# Patient Record
Sex: Female | Born: 1935 | Race: White | Hispanic: No | Smoking: Never smoker
Health system: Southern US, Community
[De-identification: ages and names within clinical notes are randomized; demographics above are authoritative.]

## PROBLEM LIST (undated history)

## (undated) DIAGNOSIS — IMO0002 Reserved for concepts with insufficient information to code with codable children: Secondary | ICD-10-CM

## (undated) DIAGNOSIS — R32 Unspecified urinary incontinence: Secondary | ICD-10-CM

## (undated) DIAGNOSIS — M1990 Unspecified osteoarthritis, unspecified site: Secondary | ICD-10-CM

## (undated) DIAGNOSIS — B019 Varicella without complication: Secondary | ICD-10-CM

## (undated) DIAGNOSIS — M329 Systemic lupus erythematosus, unspecified: Secondary | ICD-10-CM

## (undated) DIAGNOSIS — G44009 Cluster headache syndrome, unspecified, not intractable: Secondary | ICD-10-CM

## (undated) DIAGNOSIS — K219 Gastro-esophageal reflux disease without esophagitis: Secondary | ICD-10-CM

## (undated) HISTORY — PX: FOOT SURGERY: SHX648

## (undated) HISTORY — DX: Varicella without complication: B01.9

## (undated) HISTORY — DX: Unspecified osteoarthritis, unspecified site: M19.90

## (undated) HISTORY — DX: Unspecified urinary incontinence: R32

## (undated) HISTORY — DX: Cluster headache syndrome, unspecified, not intractable: G44.009

## (undated) HISTORY — PX: CERVICAL SPINE SURGERY: SHX589

## (undated) HISTORY — DX: Reserved for concepts with insufficient information to code with codable children: IMO0002

## (undated) HISTORY — DX: Systemic lupus erythematosus, unspecified: M32.9

## (undated) HISTORY — DX: Gastro-esophageal reflux disease without esophagitis: K21.9

## (undated) HISTORY — PX: ABDOMINAL HYSTERECTOMY: SHX81

---
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
## 1968-07-18 HISTORY — PX: CHOLECYSTECTOMY: SHX55

## 1968-07-18 HISTORY — PX: APPENDECTOMY: SHX54

## 1997-08-21 ENCOUNTER — Encounter: Payer: Self-pay | Admitting: Internal Medicine

## 1998-02-24 ENCOUNTER — Other Ambulatory Visit: Admission: RE | Admit: 1998-02-24 | Discharge: 1998-02-24 | Payer: Self-pay | Admitting: Gynecology

## 1999-02-16 ENCOUNTER — Other Ambulatory Visit: Admission: RE | Admit: 1999-02-16 | Discharge: 1999-02-16 | Payer: Self-pay | Admitting: Gynecology

## 1999-10-01 ENCOUNTER — Encounter: Payer: Self-pay | Admitting: Gynecology

## 1999-10-06 ENCOUNTER — Inpatient Hospital Stay (HOSPITAL_COMMUNITY): Admission: RE | Admit: 1999-10-06 | Discharge: 1999-10-08 | Payer: Self-pay | Admitting: Gynecology

## 1999-10-21 ENCOUNTER — Encounter: Admission: RE | Admit: 1999-10-21 | Discharge: 1999-10-21 | Payer: Self-pay | Admitting: Internal Medicine

## 1999-10-21 ENCOUNTER — Encounter: Payer: Self-pay | Admitting: Internal Medicine

## 2000-04-17 ENCOUNTER — Encounter: Payer: Self-pay | Admitting: Internal Medicine

## 2001-01-13 ENCOUNTER — Encounter: Payer: Self-pay | Admitting: Rheumatology

## 2001-01-13 ENCOUNTER — Encounter: Admission: RE | Admit: 2001-01-13 | Discharge: 2001-01-13 | Payer: Self-pay | Admitting: Rheumatology

## 2001-02-19 ENCOUNTER — Other Ambulatory Visit: Admission: RE | Admit: 2001-02-19 | Discharge: 2001-02-19 | Payer: Self-pay | Admitting: Gynecology

## 2001-06-05 ENCOUNTER — Encounter: Payer: Self-pay | Admitting: Internal Medicine

## 2001-12-06 ENCOUNTER — Inpatient Hospital Stay (HOSPITAL_COMMUNITY): Admission: RE | Admit: 2001-12-06 | Discharge: 2001-12-12 | Payer: Self-pay | Admitting: Neurosurgery

## 2001-12-06 ENCOUNTER — Encounter: Payer: Self-pay | Admitting: Neurosurgery

## 2001-12-09 ENCOUNTER — Encounter: Payer: Self-pay | Admitting: Neurosurgery

## 2002-01-25 ENCOUNTER — Encounter: Payer: Self-pay | Admitting: Neurosurgery

## 2002-01-25 ENCOUNTER — Ambulatory Visit (HOSPITAL_COMMUNITY): Admission: RE | Admit: 2002-01-25 | Discharge: 2002-01-27 | Payer: Self-pay | Admitting: Neurosurgery

## 2002-02-26 ENCOUNTER — Other Ambulatory Visit: Admission: RE | Admit: 2002-02-26 | Discharge: 2002-02-26 | Payer: Self-pay | Admitting: Gynecology

## 2002-05-28 ENCOUNTER — Encounter: Payer: Self-pay | Admitting: Gynecology

## 2002-05-28 ENCOUNTER — Encounter: Admission: RE | Admit: 2002-05-28 | Discharge: 2002-05-28 | Payer: Self-pay | Admitting: Gynecology

## 2002-06-24 ENCOUNTER — Encounter: Admission: RE | Admit: 2002-06-24 | Discharge: 2002-06-24 | Payer: Self-pay | Admitting: Internal Medicine

## 2002-06-24 ENCOUNTER — Encounter: Payer: Self-pay | Admitting: Internal Medicine

## 2002-12-09 ENCOUNTER — Encounter: Payer: Self-pay | Admitting: Neurosurgery

## 2002-12-09 ENCOUNTER — Encounter: Admission: RE | Admit: 2002-12-09 | Discharge: 2002-12-09 | Payer: Self-pay | Admitting: Neurosurgery

## 2003-01-16 ENCOUNTER — Encounter: Payer: Self-pay | Admitting: Internal Medicine

## 2003-01-16 LAB — CONVERTED CEMR LAB

## 2003-03-04 ENCOUNTER — Other Ambulatory Visit: Admission: RE | Admit: 2003-03-04 | Discharge: 2003-03-04 | Payer: Self-pay | Admitting: Gynecology

## 2003-11-24 ENCOUNTER — Encounter: Payer: Self-pay | Admitting: Internal Medicine

## 2004-03-08 ENCOUNTER — Other Ambulatory Visit: Admission: RE | Admit: 2004-03-08 | Discharge: 2004-03-08 | Payer: Self-pay | Admitting: Gynecology

## 2004-03-15 ENCOUNTER — Encounter: Admission: RE | Admit: 2004-03-15 | Discharge: 2004-03-15 | Payer: Self-pay | Admitting: Gynecology

## 2004-09-17 ENCOUNTER — Ambulatory Visit: Payer: Self-pay | Admitting: Internal Medicine

## 2004-12-23 ENCOUNTER — Encounter: Payer: Self-pay | Admitting: Internal Medicine

## 2004-12-31 ENCOUNTER — Ambulatory Visit (HOSPITAL_COMMUNITY): Admission: RE | Admit: 2004-12-31 | Discharge: 2004-12-31 | Payer: Self-pay | Admitting: Neurosurgery

## 2005-01-03 ENCOUNTER — Ambulatory Visit: Payer: Self-pay | Admitting: Internal Medicine

## 2005-04-15 ENCOUNTER — Ambulatory Visit: Payer: Self-pay | Admitting: Internal Medicine

## 2005-04-15 ENCOUNTER — Encounter: Admission: RE | Admit: 2005-04-15 | Discharge: 2005-04-15 | Payer: Self-pay | Admitting: Internal Medicine

## 2005-05-19 ENCOUNTER — Ambulatory Visit: Payer: Self-pay | Admitting: Family Medicine

## 2005-10-18 ENCOUNTER — Encounter: Admission: RE | Admit: 2005-10-18 | Discharge: 2005-10-18 | Payer: Self-pay | Admitting: Gynecology

## 2005-11-07 ENCOUNTER — Encounter: Admission: RE | Admit: 2005-11-07 | Discharge: 2005-11-07 | Payer: Self-pay | Admitting: Gynecology

## 2005-11-22 ENCOUNTER — Encounter: Payer: Self-pay | Admitting: Internal Medicine

## 2005-12-09 ENCOUNTER — Ambulatory Visit: Payer: Self-pay | Admitting: Internal Medicine

## 2006-01-10 ENCOUNTER — Encounter: Payer: Self-pay | Admitting: Internal Medicine

## 2006-02-27 ENCOUNTER — Other Ambulatory Visit: Admission: RE | Admit: 2006-02-27 | Discharge: 2006-02-27 | Payer: Self-pay | Admitting: Gynecology

## 2006-05-25 ENCOUNTER — Ambulatory Visit: Payer: Self-pay | Admitting: Internal Medicine

## 2006-06-29 ENCOUNTER — Ambulatory Visit: Payer: Self-pay | Admitting: Internal Medicine

## 2006-08-31 ENCOUNTER — Ambulatory Visit: Payer: Self-pay | Admitting: Family Medicine

## 2006-09-12 ENCOUNTER — Ambulatory Visit: Payer: Self-pay | Admitting: Internal Medicine

## 2006-11-20 ENCOUNTER — Encounter: Payer: Self-pay | Admitting: Family Medicine

## 2006-12-06 ENCOUNTER — Encounter: Admission: RE | Admit: 2006-12-06 | Discharge: 2006-12-06 | Payer: Self-pay | Admitting: Neurosurgery

## 2006-12-13 ENCOUNTER — Encounter: Payer: Self-pay | Admitting: Internal Medicine

## 2007-07-26 ENCOUNTER — Encounter: Admission: RE | Admit: 2007-07-26 | Discharge: 2007-07-26 | Payer: Self-pay | Admitting: Gynecology

## 2007-09-06 ENCOUNTER — Ambulatory Visit: Payer: Self-pay | Admitting: Internal Medicine

## 2007-11-29 ENCOUNTER — Encounter: Payer: Self-pay | Admitting: Internal Medicine

## 2008-01-30 ENCOUNTER — Encounter: Payer: Self-pay | Admitting: Internal Medicine

## 2008-01-30 DIAGNOSIS — M949 Disorder of cartilage, unspecified: Secondary | ICD-10-CM

## 2008-01-30 DIAGNOSIS — M899 Disorder of bone, unspecified: Secondary | ICD-10-CM | POA: Insufficient documentation

## 2008-01-30 DIAGNOSIS — I10 Essential (primary) hypertension: Secondary | ICD-10-CM | POA: Insufficient documentation

## 2008-01-30 DIAGNOSIS — K5730 Diverticulosis of large intestine without perforation or abscess without bleeding: Secondary | ICD-10-CM | POA: Insufficient documentation

## 2008-02-04 ENCOUNTER — Ambulatory Visit: Payer: Self-pay | Admitting: Internal Medicine

## 2008-02-04 DIAGNOSIS — M48061 Spinal stenosis, lumbar region without neurogenic claudication: Secondary | ICD-10-CM | POA: Insufficient documentation

## 2008-02-04 DIAGNOSIS — M2550 Pain in unspecified joint: Secondary | ICD-10-CM | POA: Insufficient documentation

## 2008-02-12 ENCOUNTER — Encounter (INDEPENDENT_AMBULATORY_CARE_PROVIDER_SITE_OTHER): Payer: Self-pay | Admitting: *Deleted

## 2008-02-19 ENCOUNTER — Ambulatory Visit: Payer: Self-pay | Admitting: Internal Medicine

## 2008-02-20 ENCOUNTER — Ambulatory Visit: Payer: Self-pay | Admitting: Internal Medicine

## 2008-02-21 ENCOUNTER — Encounter (INDEPENDENT_AMBULATORY_CARE_PROVIDER_SITE_OTHER): Payer: Self-pay | Admitting: *Deleted

## 2008-02-25 LAB — CONVERTED CEMR LAB
Basophils Relative: 0 % (ref 0.0–3.0)
Eosinophils Absolute: 0.1 10*3/uL (ref 0.0–0.7)
Folate: 19.8 ng/mL
HCT: 33.8 % — ABNORMAL LOW (ref 36.0–46.0)
Hemoglobin: 11.7 g/dL — ABNORMAL LOW (ref 12.0–15.0)
Lymphocytes Relative: 31.4 % (ref 12.0–46.0)
MCV: 89.2 fL (ref 78.0–100.0)
Monocytes Relative: 4.5 % (ref 3.0–12.0)
Neutro Abs: 4 10*3/uL (ref 1.4–7.7)
Neutrophils Relative %: 62.4 % (ref 43.0–77.0)
RBC: 3.79 M/uL — ABNORMAL LOW (ref 3.87–5.11)
RDW: 12.7 % (ref 11.5–14.6)
Vitamin B-12: 711 pg/mL (ref 211–911)

## 2008-02-29 ENCOUNTER — Encounter: Payer: Self-pay | Admitting: Internal Medicine

## 2008-04-17 ENCOUNTER — Ambulatory Visit: Payer: Self-pay | Admitting: Internal Medicine

## 2008-04-24 ENCOUNTER — Encounter: Payer: Self-pay | Admitting: Internal Medicine

## 2008-05-29 ENCOUNTER — Ambulatory Visit: Payer: Self-pay | Admitting: Internal Medicine

## 2008-05-29 DIAGNOSIS — R1319 Other dysphagia: Secondary | ICD-10-CM | POA: Insufficient documentation

## 2008-05-29 DIAGNOSIS — D649 Anemia, unspecified: Secondary | ICD-10-CM | POA: Insufficient documentation

## 2008-05-30 ENCOUNTER — Encounter (INDEPENDENT_AMBULATORY_CARE_PROVIDER_SITE_OTHER): Payer: Self-pay | Admitting: *Deleted

## 2008-05-30 ENCOUNTER — Telehealth: Payer: Self-pay | Admitting: Internal Medicine

## 2008-06-03 ENCOUNTER — Telehealth: Payer: Self-pay | Admitting: Internal Medicine

## 2008-06-25 DIAGNOSIS — N816 Rectocele: Secondary | ICD-10-CM | POA: Insufficient documentation

## 2008-06-25 DIAGNOSIS — K222 Esophageal obstruction: Secondary | ICD-10-CM | POA: Insufficient documentation

## 2008-06-25 DIAGNOSIS — Z8659 Personal history of other mental and behavioral disorders: Secondary | ICD-10-CM | POA: Insufficient documentation

## 2008-06-25 DIAGNOSIS — Z862 Personal history of diseases of the blood and blood-forming organs and certain disorders involving the immune mechanism: Secondary | ICD-10-CM | POA: Insufficient documentation

## 2008-06-25 DIAGNOSIS — Z8679 Personal history of other diseases of the circulatory system: Secondary | ICD-10-CM | POA: Insufficient documentation

## 2008-06-25 DIAGNOSIS — K219 Gastro-esophageal reflux disease without esophagitis: Secondary | ICD-10-CM | POA: Insufficient documentation

## 2008-06-30 ENCOUNTER — Ambulatory Visit: Payer: Self-pay | Admitting: Internal Medicine

## 2008-07-24 ENCOUNTER — Encounter: Payer: Self-pay | Admitting: Internal Medicine

## 2008-07-24 ENCOUNTER — Ambulatory Visit: Payer: Self-pay | Admitting: Internal Medicine

## 2008-07-27 ENCOUNTER — Encounter: Payer: Self-pay | Admitting: Internal Medicine

## 2008-07-28 ENCOUNTER — Encounter: Admission: RE | Admit: 2008-07-28 | Discharge: 2008-07-28 | Payer: Self-pay | Admitting: Internal Medicine

## 2008-08-13 ENCOUNTER — Encounter: Payer: Self-pay | Admitting: Internal Medicine

## 2008-08-14 ENCOUNTER — Telehealth: Payer: Self-pay | Admitting: Internal Medicine

## 2008-09-04 ENCOUNTER — Encounter: Payer: Self-pay | Admitting: Internal Medicine

## 2008-11-07 ENCOUNTER — Encounter: Payer: Self-pay | Admitting: Internal Medicine

## 2008-11-15 ENCOUNTER — Encounter: Admission: RE | Admit: 2008-11-15 | Discharge: 2008-11-15 | Payer: Self-pay | Admitting: Neurosurgery

## 2008-11-20 ENCOUNTER — Ambulatory Visit: Payer: Self-pay | Admitting: Internal Medicine

## 2008-11-24 ENCOUNTER — Ambulatory Visit (HOSPITAL_COMMUNITY): Admission: RE | Admit: 2008-11-24 | Discharge: 2008-11-24 | Payer: Self-pay | Admitting: Internal Medicine

## 2008-11-28 ENCOUNTER — Encounter: Payer: Self-pay | Admitting: Internal Medicine

## 2009-01-08 ENCOUNTER — Encounter: Payer: Self-pay | Admitting: Internal Medicine

## 2009-02-16 ENCOUNTER — Telehealth: Payer: Self-pay | Admitting: Internal Medicine

## 2009-02-16 DIAGNOSIS — S82899A Other fracture of unspecified lower leg, initial encounter for closed fracture: Secondary | ICD-10-CM | POA: Insufficient documentation

## 2009-02-20 ENCOUNTER — Telehealth (INDEPENDENT_AMBULATORY_CARE_PROVIDER_SITE_OTHER): Payer: Self-pay | Admitting: *Deleted

## 2009-05-13 ENCOUNTER — Encounter: Payer: Self-pay | Admitting: Internal Medicine

## 2009-05-14 ENCOUNTER — Encounter: Payer: Self-pay | Admitting: Internal Medicine

## 2009-07-14 ENCOUNTER — Ambulatory Visit: Payer: Self-pay | Admitting: Ophthalmology

## 2009-07-21 ENCOUNTER — Ambulatory Visit: Payer: Self-pay | Admitting: Ophthalmology

## 2009-08-17 ENCOUNTER — Encounter: Admission: RE | Admit: 2009-08-17 | Discharge: 2009-08-17 | Payer: Self-pay | Admitting: Rheumatology

## 2009-10-08 ENCOUNTER — Encounter: Payer: Self-pay | Admitting: Internal Medicine

## 2009-10-13 ENCOUNTER — Ambulatory Visit: Payer: Self-pay | Admitting: Ophthalmology

## 2010-01-28 ENCOUNTER — Encounter: Payer: Self-pay | Admitting: Internal Medicine

## 2010-03-11 ENCOUNTER — Encounter: Payer: Self-pay | Admitting: Internal Medicine

## 2010-07-29 ENCOUNTER — Encounter: Payer: Self-pay | Admitting: Internal Medicine

## 2010-08-02 ENCOUNTER — Encounter: Payer: Self-pay | Admitting: Internal Medicine

## 2010-08-08 ENCOUNTER — Encounter: Payer: Self-pay | Admitting: Gynecology

## 2010-08-10 ENCOUNTER — Ambulatory Visit
Admission: RE | Admit: 2010-08-10 | Discharge: 2010-08-10 | Payer: Self-pay | Source: Home / Self Care | Attending: Internal Medicine | Admitting: Internal Medicine

## 2010-08-10 ENCOUNTER — Encounter: Payer: Self-pay | Admitting: Internal Medicine

## 2010-08-10 DIAGNOSIS — E785 Hyperlipidemia, unspecified: Secondary | ICD-10-CM | POA: Insufficient documentation

## 2010-08-10 DIAGNOSIS — R002 Palpitations: Secondary | ICD-10-CM | POA: Insufficient documentation

## 2010-08-10 DIAGNOSIS — R209 Unspecified disturbances of skin sensation: Secondary | ICD-10-CM | POA: Insufficient documentation

## 2010-08-10 DIAGNOSIS — R350 Frequency of micturition: Secondary | ICD-10-CM | POA: Insufficient documentation

## 2010-08-10 LAB — CONVERTED CEMR LAB
Bilirubin Urine: NEGATIVE
Glucose, Urine, Semiquant: NEGATIVE
HDL goal, serum: 40 mg/dL
LDL Goal: 130 mg/dL
Protein, U semiquant: NEGATIVE
Urobilinogen, UA: 0.2
pH: 5

## 2010-08-11 ENCOUNTER — Encounter: Payer: Self-pay | Admitting: Internal Medicine

## 2010-08-11 ENCOUNTER — Other Ambulatory Visit: Payer: Self-pay | Admitting: Internal Medicine

## 2010-08-11 ENCOUNTER — Ambulatory Visit
Admission: RE | Admit: 2010-08-11 | Discharge: 2010-08-11 | Payer: Self-pay | Source: Home / Self Care | Attending: Internal Medicine | Admitting: Internal Medicine

## 2010-08-11 DIAGNOSIS — J45909 Unspecified asthma, uncomplicated: Secondary | ICD-10-CM | POA: Insufficient documentation

## 2010-08-11 LAB — LIPID PANEL
Cholesterol: 196 mg/dL (ref 0–200)
HDL: 64.3 mg/dL (ref >39.00)
VLDL: 27.8 mg/dL (ref 0.0–40.0)

## 2010-08-11 LAB — POTASSIUM: Potassium: 3.9 mEq/L (ref 3.5–5.1)

## 2010-08-11 LAB — MAGNESIUM: Magnesium: 2.2 mg/dL (ref 1.5–2.5)

## 2010-08-11 LAB — HEMOGLOBIN A1C: Hgb A1c MFr Bld: 5.7 % (ref 4.6–6.5)

## 2010-08-19 ENCOUNTER — Encounter (INDEPENDENT_AMBULATORY_CARE_PROVIDER_SITE_OTHER): Payer: Self-pay | Admitting: *Deleted

## 2010-08-19 NOTE — Letter (Signed)
Summary: [facility]  [facility]   Imported By: Lanelle Bal 02/11/2010 11:53:27  _____________________________________________________________________  External Attachment:    Type:   Image     Comment:   External Document

## 2010-08-19 NOTE — Letter (Signed)
Summary: Endoscopy Letter  [facility]  [address] [place], [place] [address]   Phone: [phone]  Fax: [phone]      August 02, 2010 MRN: [id number]   [facility] [address] [place], [place]  [address]   Dear Ms. CHEONG,   According to your medical record, it is time for you to schedule an Endoscopy. Endoscopic screening is recommended for patients with certain upper digestive tract conditions because of associated increased risk for cancers of the upper digestive system.  This letter has been generated based on the recommendations made at the time of your prior procedure. If you feel that in your particular situation this may no longer apply, please contact our office.  Please call our office at [phone]) to schedule this appointment or to update your records at your earliest convenience.  Thank you for cooperating with [place] to provide you with the very best care possible.   Sincerely,  Hedwig Morton. Juanda Chance, M.D.  [facility] [phone]

## 2010-08-19 NOTE — Assessment & Plan Note (Signed)
Summary: CPX,FREQUENT URINATION,NOT SLEEPING,FEELS BAD/RH......   Vital Signs:  Patient profile:   77 year old female Height:      60 inches Weight:      143.8 pounds BMI:     28.19 Temp:     98.3 degrees F oral Pulse rate:   64 / minute Resp:     16 per minute BP sitting:   124 / 78  (left arm) Cuff size:   large  Vitals Entered By: Shonna Chock CMA (August 10, 2010 2:06 PM) CC: 1.) CPX   2.)Frequent urination   2.) Sleep concerns: sleep disturbance due to pain (arthritis) or frequent urination , Heartburn, Dysuria, Lipid Management   Primary Care Provider:  Marga Melnick  CC:  1.) CPX   2.)Frequent urination   2.) Sleep concerns: sleep disturbance due to pain (arthritis) or frequent urination , Heartburn, Dysuria, and Lipid Management.  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 due to carelessness; balance issues denied 7.Home Safety: home  safety proofed  8.Height, weight, &visual acuity:see VS 9.Counseling:POA & Living Will in place   10.Labs ordered based on risk factors:see orders  11.Referral Coordination: Audiology referral discussed ; she plans to see hand Surgery in [facility] for injury  5th L finger in fall 2 weeks ago 12.Care Plan:see Instructions 13.Cognitive Assessment: Oriented X3; memory & recall  intact on 2nd try   ; "WORLD" spelled backwards; subtraction good;mood & affect normal.    GERD:The patient reports acid reflux and sour taste in mouth, but denies epigastric pain, trouble swallowing, weight loss, and weight gain.  The patient reports the following alarm features of dyspepsia: dysphagia, weekly.  The patient denies the following alarm features: melena, hematemesis, and  vomiting.  Symptoms are worse with spicy foods and lying down.  The patient has found the following treatments to be  partially effective: a PPI. CBC WNL 07/30/2010 @ Dr Fatima Sanger office.       The patient also presents with urinary frequency, nocturia ( 3x/ night  on average) and urgency for 2-3 months, but denies burning with urination, hematuria, vaginal discharge, and vaginal itching.  The patient denies the following associated symptoms: fever, shaking chills, flank pain, and pelvic pain.  The patient denies the following risk factors: diabetes.  History is significant for  UTI in Summer of 2011. Premarin Rxed by Dr Vincente Poli in 2011.    Lipid Management History:      Positive NCEP/ATP III risk factors include female age 26 years old or older, family history for ischemic heart disease (males less than 34 years old), and hypertension.  Negative NCEP/ATP III risk factors include non-diabetic, non-tobacco-user status, no ASHD (atherosclerotic heart disease), no prior stroke/TIA, no peripheral vascular disease, and no history of aortic aneurysm.     Preventive Screening-Counseling & Management  Alcohol-Tobacco     Alcohol drinks/day: 0     Smoking Status: never  Caffeine-Diet-Exercise     Caffeine use/day: 5 cups / day  Hep-HIV-STD-Contraception     Dental Visit-last 6 months yes     Sun Exposure-Excessive: no  Safety-Violence-Falls     Seat Belt Use: yes     Smoke Detectors: yes      Blood Transfusions:  no.  Travel History:  2007 [place].    Current Medications (verified): 1)  Nexium 40 Mg Cpdr (Esomeprazole Magnesium) .... Take 1 Tablet By Mouth Two Times A Day 2)  Lyrica 150 Mg  Caps (Pregabalin) .Marland Kitchen.. 1 By Mouth Two Times A Day 3)  Hydroxychloroquine Sulfate 200 Mg  Tabs (Hydroxychloroquine Sulfate) .Marland Kitchen.. 1 By Mouth Two Times A Day 4)  Lunesta 3 Mg  Tabs (Eszopiclone) .Marland Kitchen.. 1 By Mouth At Bedtime 5)  Restasis 0.05 %  Emul (Cyclosporine) .... Two Times A Day 6)  Premarin 0.45 Mg  Tabs (Estrogens Conjugated) .Marland Kitchen.. 1 By Mouth Once Daily 7)  Multivitamins  Tabs (Multiple Vitamin) .Marland Kitchen.. 1 By Mouth Once Daily 8)  Mucinex Dm 30-600 Mg Xr12h-Tab (Dextromethorphan-Guaifenesin) .... As Needed  Allergies: 1)  ! Darvocet 2)  ! Codeine 3)  ! Darvon 4)  ! * Amytriptyline  Past History:  Past Medical History: DJD;disc disease C4-5, C5-6;foruminal stenosis C3-4, C4-5 ANA POSITIVE, PMH OF (ICD-V12.3); Titer 1:80; PMR , PMH of  RECTOCELE ,PMH of (ICD-618.04) ANXIETY, CHRONIC, PMH OF (ICD-V11.2) MITRAL VALVE PROLAPSE,PMH  OF (ICD-V12.50) PMH  ESOPHAGEAL STRICTURE (ICD-530.3) GERD (ICD-530.81) DYSPHAGIA (ICD-787.29) UNSPECIFIED ANEMIA (ICD-285.9) SPINAL STENOSIS, LUMBAR (ICD-724.02) OSTEOPENIA (ICD-733.90) HYPERTENSION (ICD-401.9), Labile DIVERTICULOSIS, COLON (ICD-562.10) Hyperlipidemia: Framingham Study LDL goal = < 130. Abnormal EKG with neg Stress Cardiolite, PMH of; Asthma, PMH of  Past Surgical History: bladder tacking  2001 TAH 1970's ovaries left, clinical indication unknown colonoscopy 09/2000 :Tics, due 2012, Dr Juanda Chance ; Ophthalmologic   plugs, 1999 C4-5 corpectomy with titanium  & anterior plate E4-5, 10/979;Revision C-7, 01/2002, Dr Phoebe Perch basal cell cancer  01/2006 cholecystectomy Cataract extraction, bilaterally  Family History: Father:prostate cancer,CAD, MI in 50s,arthritis No FH of Colon Cancer Mother: died in childbirth @ 89 Siblings: 2 sisters: DJD; bro : DJD; P aunt : TB; Maternal FH : DM; grandson : DM  Social History: Caffeine use/day:  5 cups / day Dental Care w/in 6 mos.:  yes Sun Exposure-Excessive:  no Seat Belt Use:  yes Blood Transfusions:  no  Review of Systems       Burning in both shins ,R > L & RUE . Dr Corliss Skains injecting knees for this.Plapitations @ night; no exertional chest pain General:  Complains of sleep disorder; Awakens frequently with arm or leg pain or due to nocturia.  Physical Exam  General:  well-nourished,in  no acute distress; alert,appropriate and cooperative throughout examination Head:  Normocephalic and atraumatic without obvious abnormalities.  Eyes:  No corneal or conjunctival inflammation noted. EOMI. Perrla. Funduscopic exam benign, without hemorrhages, exudates or papilledema.  Ears:  External ear exam shows no significant lesions or deformities.  Otoscopic examination reveals clear canals, tympanic membranes are intact bilaterally without bulging, retraction, inflammation or discharge. Hearing is grossly normal bilaterally. Nose:  External nasal examination shows no deformity or inflammation. Nasal mucosa are pink and moist without lesions or exudates. Mouth:  Oral mucosa and oropharynx without lesions or exudates.  Teeth in good repair. No pharyngeal erythema.   Neck:  Slight lipomatous ant neck changes Lungs:  Normal respiratory effort, chest expands symmetrically. Lungs are clear to auscultation, no crackles or wheezes. Heart:  Normal rate and regular rhythm. S1 and S2 normal without gallop, murmur, click, rub.S4 Abdomen:  Bowel sounds positive,abdomen soft and non-tender without masses, organomegaly or hernias noted. Rectal:  Colonoscopy due 2012 Msk:  No deformity or scoliosis noted of thoracic or lumbar spine.   Pulses:  R and L carotid,radial,dorsalis pedis and posterior tibial pulses  are full and equal bilaterally Extremities:  No clubbing, cyanosis, edema. Crepitus knees. Flexion contracture 5th R DIP   Neurologic:  alert & oriented X3 and DTRs symmetrical and normal.   Skin:  Intact without suspicious lesions or rashes Cervical Nodes:  No lymphadenopathy noted Axillary Nodes:  No palpable lymphadenopathy Psych:  memory intact for recent and remote, normally interactive, and good eye contact.     Impression & Recommendations:  Problem # 1:  PREVENTIVE HEALTH CARE (ICD-V70.0)  Orders: Medicare -1st Annual Wellness Visit [phone])  Problem # 2:  GERD (ICD-530.81) Dysphagia  weekly The following medications were removed from the medication list:    Carafate 1 Gm/83ml Susp (Sucralfate) .Marland Kitchen... Take 10cc by mouth two times a day  ac . Her updated medication list for this problem includes:    Nexium 40 Mg Cpdr (Esomeprazole magnesium) .Marland Kitchen... Take 1 tablet by mouth two times a day  Problem # 3:  FREQUENCY, URINARY (ICD-788.41) neg UA; strong FH DM Orders: UA Dipstick w/o Micro (manual) (60454)  Problem # 4:  PARESTHESIA (ICD-782.0) asymmetric; PMH of myelopathy, S/P remote cervical spine surgery  Complete Medication List: 1)  Nexium 40 Mg Cpdr (Esomeprazole magnesium) .... Take 1 tablet by mouth two times a day 2)  Lyrica 150 Mg Caps (Pregabalin) .Marland Kitchen.. 1 by mouth two times a day 3)  Hydroxychloroquine Sulfate 200 Mg Tabs (Hydroxychloroquine sulfate) .Marland Kitchen.. 1 by mouth two times a day 4)  Lunesta 3 Mg Tabs (Eszopiclone) .Marland Kitchen.. 1 by mouth at bedtime 5)  Restasis 0.05 % Emul (Cyclosporine) .... Two times a day 6)  Premarin 0.45 Mg Tabs (Estrogens conjugated) .Marland Kitchen.. 1 by mouth once daily 7)  Multivitamins Tabs (Multiple vitamin) .Marland Kitchen.. 1 by mouth once daily 8)  Mucinex Dm 30-600 Mg Xr12h-tab (Dextromethorphan-guaifenesin) .... As needed  Other Orders: EKG w/ Interpretation (93000)  Lipid Assessment/Plan:      Based on NCEP/ATP III, the patient's risk factor category is "0-1 risk factors".  The patient's lipid goals are as follows: Total cholesterol goal is 200; LDL cholesterol goal is 130; HDL cholesterol goal is 40; Triglyceride goal is 150.    Patient Instructions: 1)   Please schedule fasting labs  (see below).See Dr Juanda Chance for Dysphagia. Take Nexium before b'fast & eveing meal until seen. 2)  Avoid foods high in acid (tomatoes, citrus juices, spicy foods). Avoid eating within two hours of lying down or before exercising. Do not over eat; try smaller more frequent meals. Elevate head of bed twelve inches when sleeping. Labs: RPR, K+, Ca++, Mg++;A1c;Lipid Panel ;TSH .  Codes: 272.4, 788.41, 782.0.Gyn & / or Urology referral if labs normal.   Orders Added: 1)  Medicare -1st Annual Wellness Visit [G0438] 2)  Est. Patient Level III [09811] 3)  EKG w/ Interpretation [93000] 4)  UA Dipstick w/o Micro (manual) [81002]     Laboratory Results   Urine Tests    Routine Urinalysis   Color: lt. yellow Appearance: Clear Glucose: negative   (Normal Range: Negative) Bilirubin: negative   (Normal Range: Negative) Ketone: negative   (Normal Range: Negative) Spec. Gravity: <1.005   (Normal Range: 1.003-1.035) Blood: negative   (Normal Range: Negative) pH: 5.0   (Normal Range: 5.0-8.0) Protein: negative   (Normal Range: Negative) Urobilinogen: 0.2   (Normal Range: 0-1) Nitrite: negative   (Normal Range: Negative) Leukocyte Esterace: negative   (Normal Range: Negative)

## 2010-08-19 NOTE — Letter (Signed)
Summary: [facility]  [facility]   Imported By: Lanelle Bal 10/17/2009 10:37:45  _____________________________________________________________________  External Attachment:    Type:   Image     Comment:   External Document

## 2010-08-19 NOTE — Miscellaneous (Signed)
Summary: Nexium Approval/[employer]    Case ID: [id number] Member Number: [id number] Case Type: Initial Review Case Start Date: 05/13/2009 Case Status: Coverage has been APPROVED. You will receive a confirmation letter confirming approval of this medication. The patient will also be notified of this approval via an automated outbound phone call or a letter. Please allow approximately 2 hours to update our system with the approval. Once updated, the prescription can be re-submitted.   Coverage Start Date: 04/22/2009 Coverage End Date: 05/13/2010  Patient First Name: [facility] Patient Last Name: Sandy Merritt DOB: August 06, 1931 Patient Street Address: [address]   Patient City: [place] Patient State: [place] Patient Zip: [phone]  Drug Name & Strength: Nexium 40 Mg

## 2010-08-19 NOTE — Letter (Signed)
Summary: [facility]  [facility]   Imported By: Lanelle Bal 03/29/2010 13:43:49  _____________________________________________________________________  External Attachment:    Type:   Image     Comment:   External Document

## 2010-08-25 NOTE — Letter (Signed)
Summary: [facility]  [facility]   Imported By: Lanelle Bal 08/19/2010 09:13:42  _____________________________________________________________________  External Attachment:    Type:   Image     Comment:   External Document

## 2010-08-25 NOTE — Letter (Signed)
Summary: [facility]  [facility]   Imported By: Lanelle Bal 08/19/2010 09:11:45  _____________________________________________________________________  External Attachment:    Type:   Image     Comment:   External Document

## 2010-08-25 NOTE — Letter (Signed)
Summary: Pre Visit Letter Revised  [facility]  [address] [place], [place] [address]   Phone: [phone]  Fax: [phone]        08/19/2010 MRN: [id number] [facility] [address] [place], [place]  [address]             Procedure Date:  09/23/2010 @ 11:30   recall EGD -Dr. Juanda Chance   Welcome to the Gastroenterology Division at [facility].    You are scheduled to see a nurse for your pre-procedure visit on 09/09/2010 at 1:30 on the 3rd floor at [facility], [address]  We ask that you try to arrive at our office 15 minutes prior to your appointment time to allow for check-in.  Please take a minute to review the attached form.  If you answer "Yes" to one or more of the questions on the first page, we ask that you call the person listed at your earliest opportunity.  If you answer "No" to all of the questions, please complete the rest of the form and bring it to your appointment.    Your nurse visit will consist of discussing your medical and surgical history, your immediate family medical history, and your medications.   If you are unable to list all of your medications on the form, please bring the medication bottles to your appointment and we will list them.  We will need to be aware of both prescribed and over the counter drugs.  We will need to know exact dosage information as well.    Please be prepared to read and sign documents such as consent forms, a financial agreement, and acknowledgement forms.  If necessary, and with your consent, a friend or relative is welcome to sit-in on the nurse visit with you.  Please bring your insurance card so that we may make a copy of it.  If your insurance requires a referral to see a specialist, please bring your referral form from your primary care physician.  No co-pay is required for this nurse visit.     If you cannot keep your appointment, please call [phone] to cancel or reschedule prior to  your appointment date.  This allows [place] the opportunity to schedule an appointment for another patient in need of care.    Thank you for choosing [facility] for your medical needs.  We appreciate the opportunity to care for you.  Please visit [place] at our website  to learn more about our practice.  Sincerely, The Gastroenterology Division

## 2010-09-02 NOTE — Letter (Signed)
Summary: [facility]  [facility]   Imported By: Sherian Rein 08/24/2010 10:21:46  _____________________________________________________________________  External Attachment:    Type:   Image     Comment:   External Document

## 2010-09-06 ENCOUNTER — Encounter (INDEPENDENT_AMBULATORY_CARE_PROVIDER_SITE_OTHER): Payer: Self-pay | Admitting: *Deleted

## 2010-09-09 ENCOUNTER — Encounter: Payer: Self-pay | Admitting: Internal Medicine

## 2010-09-14 NOTE — Letter (Signed)
Summary: EGD Instructions  [facility]  [address]   [place], [place] [address]   Phone: [phone]  Fax: [phone]       Sandy Merritt    [birth year]    MRN: [id number]       Procedure Day Dorna Bloom: Thursday, 09-23-10     Arrival Time: 10:30 a.m.     Procedure Time: 11:30 a.m.     Location of Procedure:                    x  [facility] (4th Floor)   PREPARATION FOR ENDOSCOPY   On 09-23-10 THE DAY OF THE PROCEDURE:  1.   No solid foods, milk or milk products are allowed after midnight the night before your procedure.  2.   Do not drink anything colored red or purple.  Avoid juices with pulp.  No orange juice.  3.  You may drink clear liquids until 9:30 a.m., which is 2 hours before your procedure.                                                                                                CLEAR LIQUIDS INCLUDE: Water Jello Ice Popsicles Tea (sugar ok, no milk/cream) Powdered fruit flavored drinks Coffee (sugar ok, no milk/cream) Gatorade Juice: apple, white grape, white cranberry  Lemonade Clear bullion, consomm, broth Carbonated beverages (any kind) Strained chicken noodle soup Hard Candy   MEDICATION INSTRUCTIONS  Unless otherwise instructed, you should take regular prescription medications with a small sip of water as early as possible the morning of your procedure.              OTHER INSTRUCTIONS  You will need a responsible adult at least 75 years of age to accompany you and drive you home.   This person must remain in the waiting room during your procedure.  Wear loose fitting clothing that is easily removed.  Leave jewelry and other valuables at home.  However, you may wish to bring a book to read or an iPod/MP3 player to listen to music as you wait for your procedure to start.  Remove all body piercing jewelry and leave at home.  Total time from sign-in until discharge is approximately 2-3 hours.  You should go  home directly after your procedure and rest.  You can resume normal activities the day after your procedure.  The day of your procedure you should not:   Drive   Make legal decisions   Operate machinery   Drink alcohol   Return to work  You will receive specific instructions about eating, activities and medications before you leave.    The above instructions have been reviewed and explained to me by   Ezra Sites RN  September 09, 2010 1:52 PM     I fully understand and can verbalize these instructions _____________________________ Date _________

## 2010-09-14 NOTE — Miscellaneous (Signed)
Summary: [facility]  Clinical Lists Changes  Allergies: Changed allergy or adverse reaction from DARVOCET to DARVOCET Changed allergy or adverse reaction from CODEINE to CODEINE Changed allergy or adverse reaction from DARVON to DARVON Removed allergy or adverse reaction of * AMYTRIPTYLINE

## 2010-09-23 ENCOUNTER — Encounter: Payer: Self-pay | Admitting: Internal Medicine

## 2010-09-23 ENCOUNTER — Other Ambulatory Visit (AMBULATORY_SURGERY_CENTER): Payer: Medicare Other | Admitting: Internal Medicine

## 2010-09-23 ENCOUNTER — Other Ambulatory Visit: Payer: Self-pay | Admitting: Internal Medicine

## 2010-09-23 DIAGNOSIS — K2270 Barrett's esophagus without dysplasia: Secondary | ICD-10-CM

## 2010-09-23 DIAGNOSIS — R933 Abnormal findings on diagnostic imaging of other parts of digestive tract: Secondary | ICD-10-CM

## 2010-09-28 NOTE — Procedures (Addendum)
Summary: Upper Endoscopy  Patient: [name] Note: All result statuses are Final unless otherwise noted.  Tests: (1) Upper Endoscopy (EGD)   EGD Upper Endoscopy       DONE     [facility]     [address]     [place], [place]  [address]          ENDOSCOPY PROCEDURE REPORT          PATIENT:  Sandy, Merritt  MR#:  [id number]     BIRTHDATE:  [birth year], 78 yrs. old  GENDER:  female          ENDOSCOPIST:  Hedwig Morton. Juanda Chance, MD     Referred by:          PROCEDURE DATE:  09/23/2010     PROCEDURE:  EGD with biopsy, [address]     ASA CLASS:  Class II     INDICATIONS:  h/o Barrett's Esophagus Barrett's 07/2008,intestinal     metaplasia, goblet cell metaplasia     hx of severe reflux, refractory to nexiem 40 mg qd          MEDICATIONS:   Versed 5 mg, Fentanyl 50 mcg     TOPICAL ANESTHETIC:  Exactacain Spray          DESCRIPTION OF PROCEDURE:   After the risks benefits and     alternatives of the procedure were thoroughly explained, informed     consent was obtained.  The LB [id number] endoscope was     introduced through the mouth and advanced to the second portion of     the duodenum, without limitations.  The instrument was slowly     withdrawn as the mucosa was fully examined.     <<PROCEDUREIMAGES>>          irregular Z-line. islets of gastric mucosa proximal to z-line     Multiple biopsies were obtained and sent to pathology (see image6,     image5, and image4).  Otherwise the examination was normal (see     image3, image2, and image1).    Retroflexed views revealed no     abnormalities.    The scope was then withdrawn from the patient     and the procedure completed.          COMPLICATIONS:  None          ENDOSCOPIC IMPRESSION:     1) Irregular Z-line     2) Otherwise normal examination     s/p biopsies for follow up of Barrett's esophagus     RECOMMENDATIONS:     1) Await biopsy results     2) Anti-reflux regimen to be follow     continue  Nexiem 40 mg daily     add Prelosec 20 mg po qd for breakthrough Sx's, #30 1 po qd, 6     refills          REPEAT EXAM:  In 3 year(s) for.          ______________________________     Hedwig Morton. Juanda Chance, MD          CC:  Pecola Lawless, MD          n.     Rosalie DoctorHedwig Morton. Kevina Piloto at 09/23/2010 12:09 PM          Jenel Lucks, [id number]  Note: An exclamation mark (!) indicates a result that was not dispersed into the flowsheet. Document Creation Date: 09/23/2010 12:09 PM  _______________________________________________________________________  (1) Order result status: Final Collection or observation date-time: 09/23/2010 11:53 Requested date-time:  Receipt date-time:  Reported date-time:  Referring Physician:   Ordering Physician: Lina Sar [id number]) Specimen Source:  Source: Launa Grill Order Number: [id number] Lab site:   Appended Document: Upper Endoscopy     Procedures Next Due Date:    EGD: 09/2012

## 2010-09-28 NOTE — Miscellaneous (Signed)
Summary: prilosec rx  Clinical Lists Changes  Medications: Added new medication of PRILOSEC OTC 20 MG  TBEC (OMEPRAZOLE MAGNESIUM) 1 each day 30 minutes before meal - Signed Rx of PRILOSEC OTC 20 MG  TBEC (OMEPRAZOLE MAGNESIUM) 1 each day 30 minutes before meal;  #30 x 6;  Signed;  Entered by: Greer Ee RN;  Authorized by: Hart Carwin MD;  Method used: Electronically to [id number] [address], [place], [place]  [address], Ph: [phone], Fax: [phone]    Prescriptions: PRILOSEC OTC 20 MG  TBEC (OMEPRAZOLE MAGNESIUM) 1 each day 30 minutes before meal  #30 x 6   Entered by:   Greer Ee RN   Authorized by:   Hart Carwin MD   Signed by:   Greer Ee RN on 09/23/2010   Method used:   Electronically to        [id number] ([facility]ail[facility]       [address]       [place], [place]  [address]       Ph: [phone]       Fax: [phone]   RxID:   [id number]

## 2010-10-02 ENCOUNTER — Encounter: Payer: Self-pay | Admitting: Internal Medicine

## 2010-10-05 NOTE — Letter (Addendum)
Summary: Patient Notice-[facility]  [address] [place], [place] [address]   Phone: [phone]  Fax: [phone]        October 02, 2010 MRN: [id number]    [facility] [address] [place], [place]  [address]    Dear Ms. Darrick Penna,  I am pleased to inform you that the biopsies taken during your recent endoscopic examination did not show any evidence of cancer upon pathologic examination.  However, your biopsies indicate you have a condition known as Barrett's esophagus. While not cancer, it is pre-cancerous (can progress to cancer) and needs to be monitored with repeat endoscopic examination and biopsies.  Fortunately, it is quite rare that this develops into cancer, but careful monitoring of the condition along with taking your medication as prescribed is important in reducing the risk of developing cancer.  It is my recommendation that you have a repeat upper gastrointestinal endoscopic examination in 2_ years.  Additional information/recommendations:  __Please call [phone] to schedule a return visit to further      evaluate your condition.  _x_Continue with treatment plan as outlined the day of your exam.  Please call us if you have or develop heartburn, reflux symptoms, any swallowing problems, or if you have questions about your condition that have not been fully answered at this time.  Sincerely,  Hart Carwin MD  This letter has been electronically signed by your physician.  Appended Document: Patient Notice-[facility] letter mailed

## 2010-10-06 ENCOUNTER — Encounter: Payer: Self-pay | Admitting: Internal Medicine

## 2010-10-14 NOTE — Letter (Signed)
Summary: Endo/Colon Letter  [facility]  [address] [place], [place] [address]   Phone: [phone]  Fax: [phone]      October 06, 2010 MRN: [id number]   [facility] [address] [place], [place]  [address]   Dear Ms. RIEPE,   According to your medical record, it is time for you to schedule an Endoscopy/Colonoscopy . Endoscopic screeening is recommended for patients with certain upper digestive tract conditions because of associated increased risk for cancers of the upper digestive system. The American Cancer Society recommends Colonoscopy as a method to detect early colon cancer. Patients with a family history of colon cancer, or a personal history of colon polyps or inflammatory bowel disease are at increased risk.  This letter has been generated based on the recommendations made at the time of your prior procedure. If you feel that in your particular situation this may no longer apply, please contact our office.  Please call our office at [phone] to schedule this appointment or to update your records at your earliest convenience.  Thank you for cooperating with [place] to provide you with the very best care possible.   Sincerely,  Hedwig Morton. Juanda Chance, M.D.  [facility] [phone]

## 2010-12-03 NOTE — H&P (Signed)
[place]. [facility]  Patient:    Sandy Merritt, MCQUITTY Visit Number: [id number] MRN: [id number]          Service Type: SUR Location: MICU 2114 01 Attending Physician:  Colon Branch Dictated by:   Clydene Fake, M.D. Admit Date:  12/06/2001                           History and Physical  CHIEF COMPLAINT:  Neck and arm pain.  HISTORY:  The patient is a 77 year old woman who for years had both neck, arm, and back and leg pain.  She had been found with MRI to have subluxation of 3-4 with severe spondylosis and cord compression at 3-4, 4-5, 5-6, 6-7.  This has been worsened over period of time.  Patient has neck pain if she moves wrong, it is a sharp pinching pain that goes from her neck into her shoulders, more on the right side than the left occasionally.  She also has pain that runs down her arms and numbness radiating into her middle fingers bilaterally, worse on the right than the left.  She also has noticed she has been dropping things with both hands, some sloppiness in her handwriting, and some jumpiness of her legs at night.  The patient is being admitted for anterior cervical decompression and fusion for cord compression.  PAST MEDICAL HISTORY:  Significant for osteoporosis, possible fibromyalgia.  PREVIOUS SURGERY:  Includes hysterectomy, gallbladder surgery, bladder tack-up, C section.  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  Does not smoke or drink alcohol.  MEDICATIONS: 1. Ambien p.r.n. 2. Prednisone 1 mg q.d. 3. Premarin 0.625 mg q.d. 4. Prilosec 40 q.d. 5. Celebrex 200 mg q.d. 6. Zoloft 50 mg every other day.  DRUG ALLERGIES:  Includes some adverse reactions to DARVON and MORPHINE.  REVIEW OF SYSTEMS:  Otherwise negative.  FAMILY HISTORY:  Noncontributory.  PHYSICAL EXAMINATION:  VITAL SIGNS:  Weight 146, blood pressure 122/69, pulse 64, temperature 98.3.  NEUROLOGIC:  Range of motion  of the neck is slightly decreased in extension. Motor strength is intact in all motor distributions of the legs and arms.  No pronator drift.  Deep tendon reflexes 2/4, equal bilaterally.  Sensation is intact to light touch and pinprick throughout upper and lower extremities. Deep tendon reflexes are 2/4 in the lower extremities also.  Gait is done fairly normal and no trouble with tandem gait.  ASSESSMENT AND PLAN:  Patient with cord compression, and flexion-extension x-rays also show subluxation at 3-4 that moves between flexion-extension.  The patient will be brought in for corpectomy and fusion of cervical spine. Dictated by:   Clydene Fake, M.D. Attending Physician:  Colon Branch DD:  12/06/01 TD:  12/07/01 Job: [id number]

## 2010-12-03 NOTE — Op Note (Signed)
[place]. [facility]  Patient:    Sandy Merritt, Sandy Merritt Visit Number: [id number] MRN: [id number]          Service Type: SUR Location: 3000 3034 01 Attending Physician:  Colon Branch Dictated by:   Clydene Fake, M.D. Proc. Date: 12/06/01 Admit Date:  12/06/2001                             Operative Report  PREOPERATIVE DIAGNOSES:  Instability of C3-4 and spondylosis throughout the cervical spine with cord compression at multiple levels with myelopathic and radicular symptoms.  POSTOPERATIVE DIAGNOSES:  Instability of C3-4 and spondylosis throughout the cervical spine with cord compression at multiple levels with myelopathic and radicular symptoms.  PROCEDURE:  C4 and C5 corpectomy, fusion of C3 through 6 with SynMesh cage and autograft of same incision, anterior cervical diskectomy and fusion at C6-7 with structural allograft, anterior cervical plating of C3 through 7, which is four levels, microdissection with microscope.  SURGEON:  Clydene Fake, M.D.  ASSISTANT:  Cristi Loron, M.D.  ANESTHESIA:  General endotracheal tube.  ESTIMATED BLOOD LOSS:  150 cc.  BLOOD GIVEN:  None.  DRAINS:  None.  COMPLICATIONS:  None.  INDICATIONS FOR PROCEDURE:  The patient is a 78 year old woman who has severe neck pain with radicular symptoms and some early myelopathy, and was found to have instability of the neck with cord compression at multiple levels.  The patient is brought in for the above named procedure.  DESCRIPTION OF PROCEDURE:  The patient was brought into the operating room and general anesthesia was induced.  The patient was placed in halter traction with 10 pounds of weights, and then prepped and draped in a sterile fashion. An oblique incision was made on the left side of the neck after injecting the area with 19 cc of 1% lidocaine with epinephrine.  An incision was made just anterior to the sternocleidomastoid muscle.  The incision  was taken to the platysma and hemostasis was obtained with Bovie cauterization.  The platysma was incised and blunt dissection was taken through the cervical fascia to the anterior cervical spine.  Two needles were placed in the disk spaces and x-rays were obtained showing that this was the 3-4 and 4-5 interspaces.  The disk spaces were both incised.  Partial diskectomy was performed as the pieces were removed.  The longus colli muscle was then reflected laterally on each side from C3 through C7.  Self-retaining retractor system was then placed.  The disk spaces of C3-4, 4-5, and 5-6 were then incised and diskectomy performed with pituitary rongeurs.  The microscope was brought in for microdissection at this point and corpectomy at C4 and C5 were then done using rongeurs and a high speed drill, and we drilled on to the posterior ligament and then removed the ligament, and the posterior aspect of the disk and annulus with Kerrison punches.  This decompressed the central canal over the spinal cord.  Bilateral foraminotomies were then performed at 3-4, 4-5, and 5-6.  The 4-5 level was extremely tight bilaterally.  Hemostasis was obtained with Gelfoam and thrombin and this was removed.  Prior to the doing the corpectomy, distraction pins were placed in the C3 and C6, and the cervical spine was distracted.  We measured the distance with a caliper from the bottom of C3 to the top of C6, and put a SynMesh cage to fit.  The endplates were placed  and the bone from the corpectomy, the autograft bone was chopped up into small pieces.  To this, DBX putty was added, and this mixture was placed into the [facility] cage.  The [facility] cage was then tapped into place as an interbody strut from C3 through 6.  It was counter sunk a couple of millimeters and a nerve hook showed there was room behind the cage, between the cage and the dura. At this point, the distraction pin from C3 was removed and placed into  C7, and C6-7 disk space was massaged, and then then C6-7 spacer was distracted with a distraction retractor. Diskectomy was performed with pituitary rongeurs, curets, and a high speed drill.  The disk space was severely sclerosed and large osteophytes posterior indenting the cord.  This was all removed with the high speed drill and 1 mm and 2 mm Kerrison punches.  Bilateral foraminotomies were performed.  The depth of the vertebral artery was measured and a 7 mm high structural allograft in bound was then cut to size and tapped into place.  Distraction pins were removed and the hole from the distraction pins had hemostasis obtained with Gelfoam and thrombin.  We again checked the pin in the graft and there was room between the graft and dura bilaterally.  The wound was irrigated with antibiotic solution.  The distance from C3 through C7 was measured and a 62 mm tether anterior cervical plate was then placed over the anterior cervical spine and two screws placed in the C3 and two into the C7. One screw was placed in the center into C6.  An x-ray was obtained showing good position of both SynMesh cage, the allograft to bone, and the anterior plate and screws.  Retractors were all removed.  The wound was irrigated with antibiotic solution.  Hemostasis was obtained with bipolar cauterization and Gelfoam with thrombin.  Gelfoam was then irrigated out.  We had absolute hemostasis and then the platysma was closed with 3-0 Vicryl interrupted suture. The subcutaneous tissue was closed with the same.  Steri-Strips were placed on the skin.  A dressing was placed.  The patient then had a cervical collar placed, awakened from anesthesia, and transferred to the recovery room.  Prior to placement of the plates, the weights and retraction was removed. Dictated by:   Clydene Fake, M.D. Attending Physician:  Colon Branch DD:  12/06/01 TD:  12/09/01 Job: [id number]

## 2010-12-03 NOTE — H&P (Signed)
[place]. [facility]  Patient:    Sandy Merritt, Sandy Merritt Visit Number: [id number] MRN: [id number]          Service Type: DSU Location: 3100 [id number] Attending Physician:  Colon Branch Dictated by:   Clydene Fake, M.D. Admit Date:  01/25/2002 Discharge Date: 01/27/2002                           History and Physical  CHIEF COMPLAINT:  Dysphagia.  HISTORY OF PRESENT ILLNESS:  The patient is a 77 year old woman who underwent a two hole anterior cervical corpectomy at C4-5 and a C6-7 anterior diskectomy with plating from C3-C7.  The patient is improved with neurologically with decreasing pain and tingling in the arms and legs and is ambulating much better.   She has been doing well but on routine postoperative visit did have some complaints of dysphagia.  On x-ray she was found to have dislodgement of the inferior aspect of her anterior cervical plate.  The patient was admitted for surgery.  PAST MEDICAL HISTORY:  Significant for osteoporosis and possible fibromyalgia. Surgery includes hysterectomy and gallbladder surgery, bladder tackup, C-section and the anterior cervical surgery mentioned above.  SOCIAL HISTORY:  She is divorced.  She is employed as a Statistician.  She does not smoke or drink alcohol.  MEDICATIONS:  Ambien, prednisone, Premarin, Prilosec, Celebrex, Zoloft.  DRUG ALLERGIES:  DARVON and MORPHINE.  REVIEW OF SYSTEMS:  Otherwise negative.  FAMILY HISTORY:  Noncontributory.  PHYSICAL EXAMINATION:  HEENT:  Unremarkable.  NECK:  She has a well-healed incision.  LUNGS:  Clear.  HEART:  Regular rhythm.  ABDOMEN:  Soft and nontender.  EXTREMITIES:  Intact.  No edema.  ASSESSMENT AND PLAN:  Patient with dislodgement of the anterior cervical plate.  The patient will be admitted for exploration and fusion and probable replacement of an anterior cervical plate. Dictated by:   Clydene Fake, M.D. Attending Physician:   Colon Branch DD:  01/25/02 TD:  01/29/02 Job: [id number]

## 2010-12-03 NOTE — Discharge Summary (Signed)
[place]. [facility]  Patient:    Sandy Merritt, Sandy Merritt Visit Number: [id number] MRN: [id number]          Service Type: SUR Location: 3000 3034 01 Attending Physician:  Colon Branch Dictated by:   Clydene Fake, M.D. Admit Date:  12/06/2001 Discharge Date: 12/12/2001   CC:         Julieanne Cotton, M.D.  Titus Dubin. Alwyn Ren, M.D. [facility]   Discharge Summary  DISCHARGE DIAGNOSIS:  Instability at C3-4 with spondylosis and spinal cord compression.  PROCEDURE:  C4 and 5 corpectomies with fusion, C3 through C6 with a Synmest cage and autograft bone and ACDF at 6-7 with allograft, anterior cervical plates from C3 through C7, and microdissection with microscope.  REASON FOR ADMISSION:  The patient is a 77 year old woman who is having neck and arm pain and had an MRI of her neck showing severe spondylitic changes with some subluxation of 3-4 with cord compression at multiple levels. Flexion extension films were done on the neck showing there is abnormal motion at 3-4.  The patient is mildly myelopathic and had some radicular symptoms. She was brought for decompression and fusion of the cervical spine.  HOSPITAL COURSE:  The patient was admitted on the day of surgery and underwent the procedure above without complications.  Postoperatively, the patient was transferred to the recovery room and then to the intensive care unit for overnight observation.  The next day she was doing well.  She was able to swallow.  Her voice had good quality.  She had decreased symptoms in the arms. No pain or numbness in the arms and moving them well, but had not been on the bed yet.  We worked on increasing her activity.  The patient continued to make improvements over the next couple of days.  She had some pain in her shoulders, but the arm symptoms were resolved.  She continued to increase her activity.  She went to take a shower on May 25 and had a syncopal episode where she said  her vision just went gray and the next she knew she was on the floor.  She actually had her collar off for the shower.  X-rays were obtained of the cervical spine which showed good position of the instrumentation and no change from her postoperative films.  She had no more symptoms other than maybe a slightly increased posterior neckache or shoulder muscle ache.  She continued having some symptoms of this near syncopal feeling when she was up, better when she was down.  Over a couple of days, this resolved.  On May 27, she was starting to do well finally, moving around well, without having any problems.  On Dec 12, 2001, she had done well for a couple of days.  Incision is well-healed.  She is eating well and ambulating well.  Incision was clean, dry, and intact.  She will be discharged home in stable condition.  DISCHARGE MEDICATIONS:  Same as prehospitalization except Celebrex for three weeks.  She can take Vicodin and Flexoril p.r.n. pain and spasms.  DIET:  As tolerated.  ACTIVITY:  No strenuous activity.  C-collar on at all times except for bathing.  FOLLOW-UP:  In three weeks in my office. Dictated by:   Clydene Fake, M.D. Attending Physician:  Colon Branch DD:  12/12/01 TD:  12/13/01 Job: [id number]

## 2010-12-03 NOTE — Op Note (Signed)
[place]. [facility]  Patient:    Sandy Merritt, Sandy Merritt Visit Number: [id number] MRN: [id number]          Service Type: DSU Location: 3100 [id number] 01 Attending Physician:  Colon Branch Dictated by:   Clydene Fake, M.D. Proc. Date: 01/25/02 Admit Date:  01/25/2002 Discharge Date: 01/27/2002                             Operative Report  PREOPERATIVE DIAGNOSIS:  Dislodgment of anterior cervical plate, status post corpectomy, anterior cervical spine for myelopathy and instability.  POSTOPERATIVE DIAGNOSIS:  dislodgment of anterior cervical plate, status post corpectomy, anterior cervical spine for myelopathy and instability.  OPERATION PERFORMED:  Exploration of anterior cervical fusion and replacement of anterior cervical plate.  SURGEON:  Clydene Fake, M.D.  ASSISTANT:  Tanya Nones. Jeral Fruit, M.D.  ANESTHESIA:  General endotracheal.  ESTIMATED BLOOD LOSS:  Minimal.  BLOOD GIVEN:  None.  DRAINS:  None.  COMPLICATIONS:  None.  INDICATIONS FOR PROCEDURE:  The patient is a 77 year old woman who underwent two-level corpectomy and at the next level diskectomy had plating from C3 through C7.  She was doing well neurologically but had some dysphagia and x-ray showed displacement of the inferior part of the plate.  Patient brought in for surgery.  There was some collapse of C6 vertebral body from the titanium cage which probably ________ the plate to dislodge.  DESCRIPTION OF PROCEDURE:  The patient was brought to the operating room and general anesthesia was induced.  The patient was prepped and draped in sterile fashion.  Site of incision was left side of the neck and previous scar was opened with a scalpel and incision was taken down to platysma.  Hemostasis obtained with Bovie cauterization.  The platysma was incised with a Bovie  and blunt dissection was taken down through the anterior fascia to the anterior cervical spine.  There was some scar over  the plate but we found the plate and carefully dissected the plate out.  The plate was definitely more anterior than the spine on the inferior portion.  The screws from C3, C6 and C7 were removed and the plate removed.  We explored the fusion and there was still some very slight motion between the titanium cage and C6 showing we did not have a stable fusion here only eight weeks postoperative as expected.  At this point a slightly smaller plate was placed over the anterior cervical spine and x-ray obtained showing this was a good position of the plate.  We then placed two screws in C3.  We used the rest of the screws and two screws were placed into C7.  X-rays were then obtained showing good position of the plate and screws and movement of the plate showed we had good stabilization of the anterior cervical spine.  Hemostasis was then obtained with bipolar cauterization and Gelfoam. The thrombin Gelfoam was then irrigated out. Surgicel was placed over the longus colli muscle and platysma was closed with 3-0 Vicryl interrupted sutures.  Subcutaneous tissues was closed with the same.  Skin was closed with benzoin and Steri-Strips.  Dressing was placed. Patient was placed back into an Aspen cervical collar, awakened from anesthesia and transferred to intensive care unit for continued postoperative observation.  The patient tolerated the procedure well. Dictated by:   Clydene Fake, M.D. Attending Physician:  Colon Branch DD:  01/25/02 TD:  01/29/02 Job: [id number]

## 2010-12-08 ENCOUNTER — Telehealth: Payer: Self-pay | Admitting: Internal Medicine

## 2010-12-08 NOTE — Telephone Encounter (Signed)
Patient calling to report that last Thursday, she woke up with the "worse acid reflux I ever had". States she was coughing, spitting up and could not breathe.  States her throat, right side and ear burned. Since, this episode she has been coughing. During the phone conversation, patient was coughing when trying to talk. States she still has episodes of not breathing right. States she is not having the reflux burning at this time but did have it several nights ago but not like last Thursday. She is burping more than usual. Patient is taking Nexium and Prilosec. Patient sounded winded on phone and coughed throughout the conversation. Encouraged patient to call her PCP Dr. Alwyn Ren re: cough and SOB. Patient states she will do this now.

## 2010-12-09 NOTE — Telephone Encounter (Signed)
Reviewed and agree.

## 2011-03-08 ENCOUNTER — Telehealth: Payer: Self-pay | Admitting: Internal Medicine

## 2011-03-08 NOTE — Telephone Encounter (Signed)
Spoke w/ pt informed we used [facility] pt ok'd information.

## 2011-04-18 ENCOUNTER — Other Ambulatory Visit: Payer: Self-pay | Admitting: Internal Medicine

## 2011-05-16 ENCOUNTER — Ambulatory Visit (INDEPENDENT_AMBULATORY_CARE_PROVIDER_SITE_OTHER): Payer: Medicare Other | Admitting: Internal Medicine

## 2011-05-16 ENCOUNTER — Encounter: Payer: Self-pay | Admitting: Internal Medicine

## 2011-05-16 VITALS — BP 150/81 | HR 75 | Temp 98.0°F | Resp 16 | Ht 60.0 in | Wt 145.0 lb

## 2011-05-16 DIAGNOSIS — M329 Systemic lupus erythematosus, unspecified: Secondary | ICD-10-CM | POA: Insufficient documentation

## 2011-05-16 DIAGNOSIS — J40 Bronchitis, not specified as acute or chronic: Secondary | ICD-10-CM

## 2011-05-16 MED ORDER — LEVOFLOXACIN 750 MG PO TABS: 750.0000 mg | ORAL_TABLET | Freq: Every day | ORAL | Status: AC

## 2011-05-16 NOTE — Progress Notes (Signed)
Subjective:    Patient ID: Sandy Merritt, female    DOB: 06-13-1932, 77 y.o.   MRN: [id number]  HPI 77 year old female presents to establish care. Her primary concern today is cough. She notes that she had a two-week history of cough and shortness of breath and was seen for this at the [facility] 2 weeks ago. She is unsure if she had a chest x-ray at that time. She was treated with azithromycin. She reports some improvement in her cough after completing this antibiotic approximately one week ago. However, her cough has recurred and is now more productive with purulent sputum. She notes some chest tightness but no wheezing or shortness of breath. She denies any new fever or chills. She is currently not taking any medication for her cough. She is concerned about ongoing infection.  Aside from this, she reports that she has been feeling well. She notes that she has a history of lupus and has been followed closely by her rheumatologist with recent blood work performed a few weeks ago. She reports good control of her symptoms using Plaquenil.  Outpatient Encounter Prescriptions as of 05/16/2011  Medication Sig Dispense Refill  . ESZOPICLONE 3 MG tablet Take 1 tablet by mouth At bedtime.      . hydroxychloroquine (PLAQUENIL) 200 MG tablet Take 1 tablet by mouth BID times 48H.      Marland Kitchen LYRICA 150 MG capsule Take 1 tablet by mouth Twice daily.      Marland Kitchen NEXIUM 40 MG capsule Take 1 tablet by mouth Daily.      Marland Kitchen PREMARIN 0.45 MG tablet Take 1 tablet by mouth Daily.        Review of Systems  Constitutional: Negative for fever, chills, appetite change, fatigue and unexpected weight change.  HENT: Positive for congestion. Negative for ear pain, sore throat, trouble swallowing, neck pain, voice change and sinus pressure.   Eyes: Negative for visual disturbance.  Respiratory: Positive for cough. Negative for shortness of breath, wheezing and stridor.   Cardiovascular: Negative for chest pain,  palpitations and leg swelling.  Gastrointestinal: Negative for nausea, vomiting, abdominal pain, diarrhea, constipation, blood in stool, abdominal distention and anal bleeding.  Genitourinary: Negative for dysuria and flank pain.  Musculoskeletal: Negative for myalgias, arthralgias and gait problem.  Skin: Negative for color change and rash.  Neurological: Negative for dizziness and headaches.  Hematological: Negative for adenopathy. Does not bruise/bleed easily.  Psychiatric/Behavioral: Negative for suicidal ideas, sleep disturbance and dysphoric mood. The patient is not nervous/anxious.    BP 150/81  Pulse 75  Temp(Src) 98 F (36.7 C) (Oral)  Resp 16  Ht 5' (1.524 m)  Wt 145 lb (65.772 kg)  BMI 28.32 kg/m2  SpO2 99%     Objective:   Physical Exam  Constitutional: She is oriented to person, place, and time. She appears well-developed and well-nourished. No distress.  HENT:  Head: Normocephalic and atraumatic.  Right Ear: External ear normal.  Left Ear: External ear normal.  Nose: Nose normal.  Mouth/Throat: Oropharynx is clear and moist. No oropharyngeal exudate.  Eyes: Conjunctivae are normal. Pupils are equal, round, and reactive to light. Right eye exhibits no discharge. Left eye exhibits no discharge. No scleral icterus.  Neck: Normal range of motion. Neck supple. No tracheal deviation present. No thyromegaly present.  Cardiovascular: Normal rate, regular rhythm, normal heart sounds and intact distal pulses.  Exam reveals no gallop and no friction rub.   No murmur heard. Pulmonary/Chest: Effort normal. No accessory muscle  usage. Not tachypneic. No respiratory distress. She has no decreased breath sounds. She has no wheezes. She has rhonchi in the right middle field and the left middle field. She has no rales. She exhibits no tenderness.  Musculoskeletal: Normal range of motion. She exhibits no edema and no tenderness.  Lymphadenopathy:    She has no cervical adenopathy.    Neurological: She is alert and oriented to person, place, and time. No cranial nerve deficit. She exhibits normal muscle tone. Coordination normal.  Skin: Skin is warm and dry. No rash noted. She is not diaphoretic. No erythema. No pallor.  Psychiatric: She has a normal mood and affect. Her behavior is normal. Judgment and thought content normal.          Assessment & Plan:  1. Bronchitis - symptoms and exam are most consistent with bronchitis. She was treated with azithromycin with minimal improvement. Will plan to treat with Levaquin. We will request a copy of her evaluation at urgent care 1 week ago. We discussed repeating her chest x-ray if symptoms persist. She will followup in one week. If symptoms are persistent at that time or at any point if symptoms worsen will get a repeat chest x-ray. We also discussed use of coughing cold medications. She is allergic to codeine but Tessalon Perles would be an option to treat her cough. She would prefer to hold off at this time. Follow up in 1 week.  2. Lupus - Will get records on previous evaluation and treatment by her rheumatologist.

## 2011-05-16 NOTE — Patient Instructions (Signed)
Start Levaquin today. Follow up in 1 week. Call sooner if symptoms not improving.

## 2011-05-23 ENCOUNTER — Encounter: Payer: Self-pay | Admitting: Internal Medicine

## 2011-05-23 ENCOUNTER — Ambulatory Visit (INDEPENDENT_AMBULATORY_CARE_PROVIDER_SITE_OTHER): Payer: Medicare Other | Admitting: Internal Medicine

## 2011-05-23 VITALS — BP 120/70 | HR 69 | Temp 97.8°F | Wt 143.0 lb

## 2011-05-23 DIAGNOSIS — G589 Mononeuropathy, unspecified: Secondary | ICD-10-CM

## 2011-05-23 DIAGNOSIS — G629 Polyneuropathy, unspecified: Secondary | ICD-10-CM

## 2011-05-23 DIAGNOSIS — J40 Bronchitis, not specified as acute or chronic: Secondary | ICD-10-CM

## 2011-05-23 DIAGNOSIS — M79674 Pain in right toe(s): Secondary | ICD-10-CM

## 2011-05-23 DIAGNOSIS — M79609 Pain in unspecified limb: Secondary | ICD-10-CM

## 2011-05-23 NOTE — Progress Notes (Signed)
Subjective:    Patient ID: Sandy Merritt, female    DOB: [birth year], 77 y.o.   MRN: [id number]  HPI 77 year old female with a history of rheumatoid arthritis and a recent episode of bronchitis presents for followup. In regards to her bronchitis she reports significant improvement after treatment with Levaquin. She reports continued cough however this is milder and now nonproductive in nature. She denies any fever or chills. She denies any wheezing. She denies any shortness of breath. She notes that her energy level is much improved.  Her primary concern today is several month history of gradually worsening burning and pain in her lower extremities. She notes that she has had some evaluation in the past but has not seen neurology. She denies ever having an EMG study performed. She has been taking Lyrica with minimal improvement. She denies any weakness in her lower extremities.  She also complains of right toe pain over last few months. This is most prominent in her right second toe. She reports an injury to this toe in the distant past. However, there is no deformity of the toe. She is interested in being evaluated by podiatry to see if any intervention such as orthotics might be helpful.  Outpatient Encounter Prescriptions as of 05/23/2011  Medication Sig Dispense Refill  . ESZOPICLONE 3 MG tablet Take 1 tablet by mouth At bedtime.      . hydroxychloroquine (PLAQUENIL) 200 MG tablet Take 1 tablet by mouth BID times 48H.      Marland Kitchen levofloxacin (LEVAQUIN) 750 MG tablet Take 1 tablet (750 mg total) by mouth daily.  7 tablet  0  . LYRICA 150 MG capsule Take 1 tablet by mouth Twice daily.      Marland Kitchen NEXIUM 40 MG capsule Take 1 tablet by mouth Daily.      Marland Kitchen PREMARIN 0.45 MG tablet Take 1 tablet by mouth Daily.        Review of Systems  Constitutional: Negative for fever, chills, appetite change, fatigue and unexpected weight change.  HENT: Positive for rhinorrhea. Negative for ear pain, congestion,  sore throat, trouble swallowing, neck pain, voice change and sinus pressure.   Eyes: Negative for visual disturbance.  Respiratory: Positive for cough. Negative for shortness of breath, wheezing and stridor.   Cardiovascular: Negative for chest pain, palpitations and leg swelling.  Gastrointestinal: Negative for abdominal pain.  Genitourinary: Negative for dysuria and flank pain.  Musculoskeletal: Positive for myalgias. Negative for arthralgias and gait problem.  Skin: Negative for color change and rash.  Neurological: Positive for numbness. Negative for dizziness and headaches.  Hematological: Negative for adenopathy. Does not bruise/bleed easily.  Psychiatric/Behavioral: Negative for suicidal ideas, sleep disturbance and dysphoric mood. The patient is not nervous/anxious.    BP 120/70  Pulse 69  Temp(Src) 97.8 F (36.6 C) (Oral)  Wt 143 lb (64.864 kg)  SpO2 98%     Objective:   Physical Exam  Constitutional: She is oriented to person, place, and time. She appears well-developed and well-nourished. No distress.  HENT:  Head: Normocephalic and atraumatic.  Right Ear: External ear normal.  Left Ear: External ear normal.  Nose: Nose normal.  Mouth/Throat: Oropharynx is clear and moist. No oropharyngeal exudate.  Eyes: Conjunctivae are normal. Pupils are equal, round, and reactive to light. Right eye exhibits no discharge. Left eye exhibits no discharge. No scleral icterus.  Neck: Normal range of motion. Neck supple. No tracheal deviation present. No thyromegaly present.  Cardiovascular: Normal rate, regular rhythm, normal heart sounds  and intact distal pulses.  Exam reveals no gallop and no friction rub.   No murmur heard. Pulmonary/Chest: Effort normal and breath sounds normal. No respiratory distress. She has no wheezes. She has no rales. She exhibits no tenderness.  Musculoskeletal: Normal range of motion. She exhibits no edema and no tenderness.  Lymphadenopathy:    She has no  cervical adenopathy.  Neurological: She is alert and oriented to person, place, and time. No cranial nerve deficit. She exhibits normal muscle tone. Coordination normal.  Skin: Skin is warm and dry. No rash noted. She is not diaphoretic. No erythema. No pallor.  Psychiatric: She has a normal mood and affect. Her behavior is normal. Judgment and thought content normal.          Assessment & Plan:  1. Bronchitis - Improving after antibiotics.  Will continue to monitor. She will call if symptoms recur.  2. Neuropathy - Unclear if pt has had previous workup. Will request previous records for evaluation. We discussed possible need for EMG testing and neurology evaluation. Will continue Lyrica for now.  3. Right toe pain - Will set up podiatry evaluation. Question if she might benefit from orthotics.

## 2011-06-07 ENCOUNTER — Other Ambulatory Visit: Payer: Self-pay | Admitting: Obstetrics and Gynecology

## 2011-06-07 DIAGNOSIS — R928 Other abnormal and inconclusive findings on diagnostic imaging of breast: Secondary | ICD-10-CM

## 2011-06-23 ENCOUNTER — Other Ambulatory Visit: Payer: Self-pay | Admitting: Obstetrics and Gynecology

## 2011-06-23 ENCOUNTER — Ambulatory Visit
Admission: RE | Admit: 2011-06-23 | Discharge: 2011-06-23 | Disposition: A | Discharge status: Home / Self Care | Payer: Medicare Other | Source: Ambulatory Visit | Attending: Obstetrics and Gynecology | Admitting: Obstetrics and Gynecology

## 2011-06-23 DIAGNOSIS — R928 Other abnormal and inconclusive findings on diagnostic imaging of breast: Secondary | ICD-10-CM

## 2011-07-06 ENCOUNTER — Ambulatory Visit
Admission: RE | Admit: 2011-07-06 | Discharge: 2011-07-06 | Disposition: A | Discharge status: Home / Self Care | Payer: Medicare Other | Source: Ambulatory Visit | Attending: Obstetrics and Gynecology | Admitting: Obstetrics and Gynecology

## 2011-07-06 DIAGNOSIS — R928 Other abnormal and inconclusive findings on diagnostic imaging of breast: Secondary | ICD-10-CM

## 2011-08-05 ENCOUNTER — Emergency Department: Payer: Self-pay | Admitting: Emergency Medicine

## 2011-08-15 ENCOUNTER — Ambulatory Visit (INDEPENDENT_AMBULATORY_CARE_PROVIDER_SITE_OTHER): Payer: Medicare Other | Admitting: Internal Medicine

## 2011-08-15 ENCOUNTER — Ambulatory Visit: Payer: Medicare Other | Admitting: Internal Medicine

## 2011-08-15 ENCOUNTER — Encounter: Payer: Self-pay | Admitting: Internal Medicine

## 2011-08-15 VITALS — BP 142/82 | HR 67 | Temp 97.8°F | Ht 60.0 in | Wt 143.0 lb

## 2011-08-15 DIAGNOSIS — S0100XA Unspecified open wound of scalp, initial encounter: Secondary | ICD-10-CM

## 2011-08-15 DIAGNOSIS — S0101XA Laceration without foreign body of scalp, initial encounter: Secondary | ICD-10-CM | POA: Insufficient documentation

## 2011-08-15 NOTE — Assessment & Plan Note (Signed)
Staples removed today without incident. Patient will continue to monitor site. She will call if there is any increased pain, swelling, fever, chills, or persistent headache. Otherwise, she will followup in one month.

## 2011-08-15 NOTE — Progress Notes (Signed)
  Subjective:    Patient ID: Sandy Merritt, female    DOB: Dec 09, 1931, 78 y.o.   MRN: [id number]  HPI 78 year old female presents for staple removal after following slipping on some ice while walking out of the hospital last week. She denies any loss of consciousness during this episode. She denies any nausea, vomiting, sleepiness, or other concerns. She was evaluated in the emergency department and reportedly had CT of her head which was normal. She had 4 staples placed in laceration the back of her scalp. She reports some headache after this fall. She reports that her headache is gradually improving. She has been taking Tylenol with resolution of her symptoms.  Outpatient Encounter Prescriptions as of 08/15/2011  Medication Sig Dispense Refill  . calcium-vitamin D (OSCAL) 250-125 MG-UNIT per tablet Take 1 tablet by mouth daily.      . cholecalciferol (VITAMIN D) 1000 UNITS tablet Take 1,000 Units by mouth daily.      Marland Kitchen ESZOPICLONE 3 MG tablet Take 1 tablet by mouth At bedtime.      . hydroxychloroquine (PLAQUENIL) 200 MG tablet Take 1 tablet by mouth BID times 48H.      Marland Kitchen LYRICA 150 MG capsule Take 1 tablet by mouth Twice daily.      . Multiple Vitamins-Minerals (MULTIVITAMIN WITH MINERALS) tablet Take 1 tablet by mouth daily.      Marland Kitchen NEXIUM 40 MG capsule Take 1 tablet by mouth Daily.      Marland Kitchen PREMARIN 0.45 MG tablet Take 1 tablet by mouth Daily.        Review of Systems  Constitutional: Negative for fever, chills, diaphoresis and fatigue.  Gastrointestinal: Negative for nausea.  Skin: Positive for wound.  Neurological: Positive for headaches. Negative for syncope and weakness.   BP 142/82  Pulse 67  Temp(Src) 97.8 F (36.6 C) (Oral)  Ht 5' (1.524 m)  Wt 143 lb (64.864 kg)  BMI 27.93 kg/m2  SpO2 99%     Objective:   Physical Exam  Constitutional: She is oriented to person, place, and time. She appears well-developed and well-nourished. No distress.  HENT:  Head: Normocephalic.      Eyes: EOM are normal. Pupils are equal, round, and reactive to light.  Neck: Normal range of motion. Neck supple.  Pulmonary/Chest: Effort normal.  Neurological: She is alert and oriented to person, place, and time. No cranial nerve deficit. Coordination normal.  Skin: Skin is warm and dry. She is not diaphoretic. No erythema.  Psychiatric: She has a normal mood and affect. Her behavior is normal. Judgment and thought content normal.          Assessment & Plan:

## 2011-08-25 ENCOUNTER — Ambulatory Visit (INDEPENDENT_AMBULATORY_CARE_PROVIDER_SITE_OTHER): Payer: Medicare Other | Admitting: Internal Medicine

## 2011-08-25 ENCOUNTER — Encounter: Payer: Self-pay | Admitting: Internal Medicine

## 2011-08-25 DIAGNOSIS — R519 Headache, unspecified: Secondary | ICD-10-CM | POA: Insufficient documentation

## 2011-08-25 DIAGNOSIS — S0100XA Unspecified open wound of scalp, initial encounter: Secondary | ICD-10-CM

## 2011-08-25 DIAGNOSIS — S0101XA Laceration without foreign body of scalp, initial encounter: Secondary | ICD-10-CM

## 2011-08-25 DIAGNOSIS — R51 Headache: Secondary | ICD-10-CM

## 2011-08-25 NOTE — Assessment & Plan Note (Signed)
Laceration has healed. Patient continues to have rare episodes of headache which she describes as mild. Likely secondary to bruising at the site. Head CT was normal. We'll continue to monitor. Followup if headache does not continue to improve and resolve.

## 2011-08-25 NOTE — Progress Notes (Signed)
Subjective:    Patient ID: Sandy Merritt, female    DOB: 1931/11/09, 78 y.o.   MRN: [id number]  HPI 78 year old female presents for followup after recent head injury in which she fell and hit head causing laceration to the back of her scalp. She was evaluated in the emergency department at that time and had head CT which was normal. She reports that the laceration is completely healed after having her stitches removed last week. She continues to have some intermittent fleeting episodes of headache at the site of her head injury. She has not used any medication for this. She describes these episodes as brief and mild. She denies any change in her vision, nausea, or other symptoms. Aside from this, she reports she is feeling well.  Outpatient Encounter Prescriptions as of 08/25/2011  Medication Sig Dispense Refill  . calcium-vitamin D (OSCAL) 250-125 MG-UNIT per tablet Take 1 tablet by mouth daily.      . cholecalciferol (VITAMIN D) 1000 UNITS tablet Take 1,000 Units by mouth daily.      Marland Kitchen ESZOPICLONE 3 MG tablet Take 1 tablet by mouth At bedtime.      . hydroxychloroquine (PLAQUENIL) 200 MG tablet Take 1 tablet by mouth BID times 48H.      Marland Kitchen LYRICA 150 MG capsule Take 1 tablet by mouth Twice daily.      . Multiple Vitamins-Minerals (MULTIVITAMIN WITH MINERALS) tablet Take 1 tablet by mouth daily.      Marland Kitchen NEXIUM 40 MG capsule Take 1 tablet by mouth Daily.      Marland Kitchen PREMARIN 0.45 MG tablet Take 1 tablet by mouth Daily.        Review of Systems  Constitutional: Negative for fever, chills and fatigue.  Skin: Positive for wound. Negative for color change and pallor.  Neurological: Positive for headaches. Negative for dizziness, tremors, syncope, facial asymmetry, speech difficulty, weakness, light-headedness and numbness.   BP 128/80  Pulse 72  Temp 97.5 F (36.4 C)  Ht 5' (1.524 m)  Wt 142 lb (64.411 kg)  BMI 27.73 kg/m2  SpO2 99%     Objective:   Physical Exam  Constitutional: She is  oriented to person, place, and time. She appears well-developed and well-nourished. No distress.  HENT:  Head: Normocephalic and atraumatic.  Right Ear: External ear normal.  Left Ear: External ear normal.  Nose: Nose normal.  Mouth/Throat: Oropharynx is clear and moist. No oropharyngeal exudate.  Eyes: Conjunctivae are normal. Pupils are equal, round, and reactive to light. Right eye exhibits no discharge. Left eye exhibits no discharge. No scleral icterus.  Neck: Normal range of motion. Neck supple. No tracheal deviation present. No thyromegaly present.  Cardiovascular: Normal rate, regular rhythm, normal heart sounds and intact distal pulses.  Exam reveals no gallop and no friction rub.   No murmur heard. Pulmonary/Chest: Effort normal and breath sounds normal. No respiratory distress. She has no wheezes. She has no rales. She exhibits no tenderness.  Musculoskeletal: Normal range of motion. She exhibits no edema and no tenderness.  Lymphadenopathy:    She has no cervical adenopathy.  Neurological: She is alert and oriented to person, place, and time. No cranial nerve deficit. She exhibits normal muscle tone. Coordination normal.  Skin: Skin is warm and dry. No rash noted. She is not diaphoretic. No erythema. No pallor.     Psychiatric: She has a normal mood and affect. Her behavior is normal. Judgment and thought content normal.  Assessment & Plan:

## 2011-08-25 NOTE — Assessment & Plan Note (Signed)
Secondary to bruising after recent head injury. Improving. We'll continue to monitor. If any persistent symptoms or progression of symptoms patient will call.

## 2012-01-17 ENCOUNTER — Ambulatory Visit (INDEPENDENT_AMBULATORY_CARE_PROVIDER_SITE_OTHER): Payer: Medicare Other | Admitting: Internal Medicine

## 2012-01-17 ENCOUNTER — Encounter: Payer: Self-pay | Admitting: Internal Medicine

## 2012-01-17 VITALS — BP 122/80 | HR 74 | Temp 97.9°F | Ht 60.0 in | Wt 142.2 lb

## 2012-01-17 DIAGNOSIS — Z01818 Encounter for other preprocedural examination: Secondary | ICD-10-CM

## 2012-01-17 LAB — CBC WITH DIFFERENTIAL/PLATELET
Eosinophils Absolute: 0 10*3/uL (ref 0.0–0.7)
Eosinophils Relative: 0.4 % (ref 0.0–5.0)
HCT: 35 % — ABNORMAL LOW (ref 36.0–46.0)
Lymphs Abs: 1.6 10*3/uL (ref 0.7–4.0)
MCHC: 33 g/dL (ref 30.0–36.0)
MCV: 86.3 fl (ref 78.0–100.0)
Monocytes Absolute: 0.4 10*3/uL (ref 0.1–1.0)
Neutrophils Relative %: 61.2 % (ref 43.0–77.0)
Platelets: 237 10*3/uL (ref 150.0–400.0)
RDW: 14.1 % (ref 11.5–14.6)
WBC: 5.5 10*3/uL (ref 4.5–10.5)

## 2012-01-17 LAB — COMPREHENSIVE METABOLIC PANEL
ALT: 17 U/L (ref 0–35)
AST: 26 U/L (ref 0–37)
Albumin: 3.9 g/dL (ref 3.5–5.2)
Alkaline Phosphatase: 61 U/L (ref 39–117)
Glucose, Bld: 72 mg/dL (ref 70–99)
Potassium: 4.1 mEq/L (ref 3.5–5.1)
Sodium: 136 mEq/L (ref 135–145)
Total Bilirubin: 0.5 mg/dL (ref 0.3–1.2)
Total Protein: 7.5 g/dL (ref 6.0–8.3)

## 2012-01-17 NOTE — Assessment & Plan Note (Signed)
Based on modified risk criteria, patient would be low risk for perioperative cardiac events. Recommend to proceed with surgery. Sent screening labs including CBC and CMP which are pending. EKG today was normal.

## 2012-01-17 NOTE — Progress Notes (Signed)
Subjective:    Patient ID: Sandy Merritt, female    DOB: 1932-04-23, 78 y.o.   MRN: [id number]  HPI 78 year old female with history of rheumatoid arthritis presents for preoperative clearance prior to bunionectomy. She has had extensive surgical history in the past. This includes cholecystectomy, tonsillectomy, hysterectomy, C-section, and surgery on her cervical spine. She has never had an allergic reaction or complication from anesthesia. She has never had excessive bleeding. She has not had any recent as per respiratory infections, chest pain, urinary tract symptoms, or other concerns. She reports she is generally feeling well.  Outpatient Encounter Prescriptions as of 01/17/2012  Medication Sig Dispense Refill  . calcium-vitamin D (OSCAL) 250-125 MG-UNIT per tablet Take 1 tablet by mouth daily.      . cholecalciferol (VITAMIN D) 1000 UNITS tablet Take 1,000 Units by mouth daily.      Marland Kitchen ESZOPICLONE 3 MG tablet Take 1 tablet by mouth At bedtime.      . hydroxychloroquine (PLAQUENIL) 200 MG tablet Take 1 tablet by mouth BID times 48H.      Marland Kitchen LYRICA 150 MG capsule Take 1 tablet by mouth Twice daily.      . Multiple Vitamins-Minerals (MULTIVITAMIN WITH MINERALS) tablet Take 1 tablet by mouth daily.      Marland Kitchen NEXIUM 40 MG capsule Take 1 tablet by mouth Daily.      Marland Kitchen PREMARIN 0.45 MG tablet Take 1 tablet by mouth Daily.        Review of Systems  Constitutional: Negative for fever, chills, appetite change, fatigue and unexpected weight change.  HENT: Negative for ear pain, congestion, sore throat, trouble swallowing, neck pain, voice change and sinus pressure.   Eyes: Negative for visual disturbance.  Respiratory: Negative for cough, shortness of breath, wheezing and stridor.   Cardiovascular: Negative for chest pain, palpitations and leg swelling.  Gastrointestinal: Negative for nausea, vomiting, abdominal pain, diarrhea, constipation, blood in stool, abdominal distention and anal bleeding.    Genitourinary: Negative for dysuria and flank pain.  Musculoskeletal: Negative for myalgias, arthralgias and gait problem.  Skin: Negative for color change and rash.  Neurological: Negative for dizziness and headaches.  Hematological: Negative for adenopathy. Does not bruise/bleed easily.  Psychiatric/Behavioral: Negative for suicidal ideas, disturbed wake/sleep cycle and dysphoric mood. The patient is not nervous/anxious.    BP 122/80  Pulse 74  Temp 97.9 F (36.6 C) (Oral)  Ht 5' (1.524 m)  Wt 142 lb 4 oz (64.524 kg)  BMI 27.78 kg/m2  SpO2 98%     Objective:   Physical Exam  Constitutional: She is oriented to person, place, and time. She appears well-developed and well-nourished. No distress.  HENT:  Head: Normocephalic and atraumatic.  Right Ear: External ear normal.  Left Ear: External ear normal.  Nose: Nose normal.  Mouth/Throat: Oropharynx is clear and moist. No oropharyngeal exudate.  Eyes: Conjunctivae are normal. Pupils are equal, round, and reactive to light. Right eye exhibits no discharge. Left eye exhibits no discharge. No scleral icterus.  Neck: Normal range of motion. Neck supple. No tracheal deviation present. No thyromegaly present.  Cardiovascular: Normal rate, regular rhythm, normal heart sounds and intact distal pulses.  Exam reveals no gallop and no friction rub.   No murmur heard. Pulmonary/Chest: Effort normal and breath sounds normal. No respiratory distress. She has no wheezes. She has no rales. She exhibits no tenderness.  Abdominal: Soft. Bowel sounds are normal. She exhibits no distension and no mass. There is no tenderness. There is  no guarding.  Musculoskeletal: Normal range of motion. She exhibits no edema and no tenderness.  Lymphadenopathy:    She has no cervical adenopathy.  Neurological: She is alert and oriented to person, place, and time. No cranial nerve deficit. She exhibits normal muscle tone. Coordination normal.  Skin: Skin is warm and  dry. No rash noted. She is not diaphoretic. No erythema. No pallor.  Psychiatric: She has a normal mood and affect. Her behavior is normal. Judgment and thought content normal.          Assessment & Plan:

## 2012-01-18 LAB — FERRITIN: Ferritin: 9.8 ng/mL — ABNORMAL LOW (ref 10.0–291.0)

## 2012-01-25 ENCOUNTER — Other Ambulatory Visit: Payer: Self-pay | Admitting: Internal Medicine

## 2012-01-25 DIAGNOSIS — Z1211 Encounter for screening for malignant neoplasm of colon: Secondary | ICD-10-CM

## 2012-01-27 ENCOUNTER — Other Ambulatory Visit: Payer: Medicare Other

## 2012-01-27 DIAGNOSIS — Z1211 Encounter for screening for malignant neoplasm of colon: Secondary | ICD-10-CM

## 2012-01-27 LAB — FECAL OCCULT BLOOD, IMMUNOCHEMICAL: Fecal Occult Bld: NEGATIVE

## 2012-02-06 ENCOUNTER — Ambulatory Visit: Payer: Self-pay | Admitting: Anesthesiology

## 2012-02-08 ENCOUNTER — Ambulatory Visit: Payer: Self-pay | Admitting: Podiatry

## 2012-02-21 LAB — HM MAMMOGRAPHY: HM Mammogram: NORMAL

## 2012-02-23 ENCOUNTER — Ambulatory Visit: Payer: Medicare Other | Admitting: Internal Medicine

## 2012-02-29 ENCOUNTER — Other Ambulatory Visit: Payer: Self-pay | Admitting: Obstetrics and Gynecology

## 2012-02-29 DIAGNOSIS — N63 Unspecified lump in unspecified breast: Secondary | ICD-10-CM

## 2012-03-15 ENCOUNTER — Other Ambulatory Visit: Payer: Self-pay | Admitting: Obstetrics and Gynecology

## 2012-03-15 ENCOUNTER — Ambulatory Visit
Admission: RE | Admit: 2012-03-15 | Discharge: 2012-03-15 | Disposition: A | Discharge status: Home / Self Care | Payer: Medicare Other | Source: Ambulatory Visit | Attending: Obstetrics and Gynecology | Admitting: Obstetrics and Gynecology

## 2012-03-15 DIAGNOSIS — N63 Unspecified lump in unspecified breast: Secondary | ICD-10-CM

## 2012-04-04 ENCOUNTER — Encounter: Payer: Self-pay | Admitting: Internal Medicine

## 2012-07-23 LAB — HM COLONOSCOPY

## 2012-10-09 ENCOUNTER — Encounter: Payer: Self-pay | Admitting: Internal Medicine

## 2012-10-22 ENCOUNTER — Encounter: Payer: Self-pay | Admitting: Internal Medicine

## 2012-12-17 ENCOUNTER — Encounter: Payer: Self-pay | Admitting: Internal Medicine

## 2012-12-17 ENCOUNTER — Ambulatory Visit (AMBULATORY_SURGERY_CENTER): Payer: Medicare Other | Admitting: *Deleted

## 2012-12-17 VITALS — Ht 60.0 in | Wt 146.0 lb

## 2012-12-17 DIAGNOSIS — Z1211 Encounter for screening for malignant neoplasm of colon: Secondary | ICD-10-CM

## 2012-12-17 DIAGNOSIS — K2270 Barrett's esophagus without dysplasia: Secondary | ICD-10-CM

## 2012-12-17 MED ORDER — MOVIPREP 100 G PO SOLR: ORAL | Status: DC

## 2012-12-17 NOTE — Progress Notes (Signed)
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  Patient denies any problems during last colonoscopy or EGD.

## 2012-12-21 ENCOUNTER — Encounter: Payer: Self-pay | Admitting: Internal Medicine

## 2012-12-21 ENCOUNTER — Ambulatory Visit (AMBULATORY_SURGERY_CENTER): Payer: Medicare Other | Admitting: Internal Medicine

## 2012-12-21 VITALS — BP 149/75 | HR 64 | Temp 96.7°F | Resp 15 | Ht 60.0 in | Wt 146.0 lb

## 2012-12-21 DIAGNOSIS — K2990 Gastroduodenitis, unspecified, without bleeding: Secondary | ICD-10-CM

## 2012-12-21 DIAGNOSIS — K2270 Barrett's esophagus without dysplasia: Secondary | ICD-10-CM

## 2012-12-21 DIAGNOSIS — D126 Benign neoplasm of colon, unspecified: Secondary | ICD-10-CM

## 2012-12-21 DIAGNOSIS — Z1211 Encounter for screening for malignant neoplasm of colon: Secondary | ICD-10-CM

## 2012-12-21 DIAGNOSIS — K2970 Gastritis, unspecified, without bleeding: Secondary | ICD-10-CM

## 2012-12-21 MED ORDER — POLYETHYLENE GLYCOL 3350 17 GM/SCOOP PO POWD: 9.0000 g | ORAL | Status: DC

## 2012-12-21 MED ORDER — SODIUM CHLORIDE 0.9 % IV SOLN: 500.0000 mL | INTRAVENOUS | Status: DC

## 2012-12-21 NOTE — Progress Notes (Signed)
Patient did not experience any of the following events: a burn prior to discharge; a fall within the facility; wrong site/side/patient/procedure/implant event; or a hospital transfer or hospital admission upon discharge from the facility. (G8907) Patient did not have preoperative order for IV antibiotic SSI prophylaxis. (G8918)  

## 2012-12-21 NOTE — Progress Notes (Signed)
Called to room to assist during endoscopic procedure.  Patient ID and intended procedure confirmed with present staff. Received instructions for my participation in the procedure from the performing physician.  

## 2012-12-21 NOTE — Patient Instructions (Addendum)

## 2012-12-21 NOTE — Op Note (Signed)
[facility] [address] [place], [address]   COLONOSCOPY PROCEDURE REPORT  PATIENT: [name], [name]  MR#: [id number] BIRTHDATE: 1932-04-09 , 80  yrs. old GENDER: Female ENDOSCOPIST: Hart Carwin, MD REFERRED BY:  Dr Ronna Polio PROCEDURE DATE:  12/21/2012 PROCEDURE:   Colonoscopy with cold biopsy polypectomy ASA CLASS:   Class II INDICATIONS:last colonoscopy in 2002. MEDICATIONS: MAC sedation, administered by CRNA and propofol (Diprivan) 200mg  IV  DESCRIPTION OF PROCEDURE:   After the risks and benefits and of the procedure were explained, informed consent was obtained.  A digital rectal exam revealed no abnormalities of the rectum.    The [id number]  endoscope was introduced through the anus and advanced to the cecum, which was identified by both the appendix and ileocecal valve .  The quality of the prep was good, using MoviPrep .  The instrument was then slowly withdrawn as the colon was fully examined.     COLON FINDINGS: A diminutive sessile polyp was found in the rectum. A polypectomy was performed with cold forceps.  The resection was complete and the polyp tissue was completely retrieved. Retroflexed views revealed no abnormalities.     The scope was then withdrawn from the patient and the procedure completed.  COMPLICATIONS: There were no complications. ENDOSCOPIC IMPRESSION: Diminutive sessile polyp was found in the rectum; polypectomy was performed with cold forceps mild melanosis coli  RECOMMENDATIONS: 1.  Await pathology results 2.  High fiber diet   REPEAT EXAM: for Colonoscopy, pending biopsy results.  cc:  _______________________________ eSignedHart Carwin, MD 12/21/2012 11:37 AM     PATIENT NAME:  Sandy Merritt, Sandy Merritt MR#: [id number]

## 2012-12-21 NOTE — Op Note (Signed)
[facility] [address] [place], [address]   ENDOSCOPY PROCEDURE REPORT  PATIENT: Sandy, Merritt  MR#: [id number] BIRTHDATE: [birth year] , 80  yrs. old GENDER: Female ENDOSCOPIST: Hart Carwin, MD REFERRED BY:  Ronna Polio, M.D. PROCEDURE DATE:  12/21/2012 PROCEDURE:  EGD w/ biopsy ASA CLASS:     Class II INDICATIONS:  history of Barrett's esophagus.   Barrett's esophagus 2010, 2012,. MEDICATIONS: MAC sedation, administered by CRNA and propofol (Diprivan) 150mg  IV TOPICAL ANESTHETIC: Cetacaine Spray  DESCRIPTION OF PROCEDURE: After the risks benefits and alternatives of the procedure were thoroughly explained, informed consent was obtained.  The LB [id number] endoscope was introduced through the mouth and advanced to the second portion of the duodenum. Without limitations.  The instrument was slowly withdrawn as the mucosa was fully examined.      esophagus: Esophageal mucosa appeared normal throughout the proximal mid and distal esophagus. The squamocolumnar junction appeared irregular, there was a small islet of gastric mucosa above the z-line. There was no stricture or esophagitis. There was no significant hiatal hernia.biopsies were taken from gastroesophageal junction to followup on Barrett's esophagus Stomach: Stomach was insufflated with air and showed decreased rugal pattern and prominent submucosal blood vessels suggestive of gastric atrophy. Biopsies were taken from gastric body to rule out metaplasia retroflexion of the endoscope revealed normal fundus and cardia. Pyloric polyp that was normal Duodenum duodenal bulb and descending duodenum appeared normal[ The scope was then withdrawn from the patient and the procedure completed.  COMPLICATIONS: There were no complications. ENDOSCOPIC IMPRESSION: 1. irregular Z line. Status post biopsies to  followup on Barrett's esophagus  2. Decreased rugal pattern suggestive of gastric  atrophy. Status post biopsies to rule out atrophic gastritis RECOMMENDATIONS: 1.  Await pathology results 2.  Anti-reflux regimen to be follow 3.  Continue PPI  REPEAT EXAM: for EGD pending biopsy results.  eSigned:  Hart Carwin, MD 12/21/2012 11:31 AM   CC:

## 2012-12-24 ENCOUNTER — Telehealth: Payer: Self-pay | Admitting: *Deleted

## 2012-12-24 NOTE — Telephone Encounter (Signed)
  Follow up Call-  Call back number 12/21/2012  Post procedure Call Back phone  # [phone]  Permission to leave phone message Yes     No answer, left message.

## 2012-12-27 ENCOUNTER — Encounter: Payer: Self-pay | Admitting: Internal Medicine

## 2013-01-03 ENCOUNTER — Telehealth: Payer: Self-pay | Admitting: Internal Medicine

## 2013-01-03 NOTE — Telephone Encounter (Signed)
Pt states she got a letter from Dr. Juanda Chance stating she has Barrett's esophagus. States that the bottom of the letter stated for her to call if she was having heartburn or reflux. Pt states she has heartburn even when she takes the Nexium. Also reports that she has reflux at night. Pt wanted to let Dr. Juanda Chance know. Dr. Juanda Chance notified.

## 2013-01-03 NOTE — Telephone Encounter (Signed)
Please ask pt to take Nexiem 40 mg po q am and Ranitidine 150 mg po qhs, #30, 3 refills, and make an appointment to see me, first available.

## 2013-01-04 MED ORDER — RANITIDINE HCL 150 MG PO TABS: 150.0000 mg | ORAL_TABLET | Freq: Every day | ORAL | Status: DC

## 2013-01-04 MED ORDER — ESOMEPRAZOLE MAGNESIUM 40 MG PO CPDR: 40.0000 mg | DELAYED_RELEASE_CAPSULE | Freq: Every day | ORAL | Status: DC

## 2013-01-04 NOTE — Telephone Encounter (Signed)
Rx sent I have left a message for the patient to call back to schedule an appt

## 2013-01-07 NOTE — Telephone Encounter (Signed)
Left a message for patient to call me. 

## 2013-01-08 NOTE — Telephone Encounter (Signed)
Spoke with patient and she wants to try the medications to see if it will help. She does not want to schedule an OV at this time. States she will call and schedule if she does not get better.

## 2013-01-08 NOTE — Telephone Encounter (Signed)
Left a message for patient to call me. 

## 2013-06-04 ENCOUNTER — Other Ambulatory Visit: Payer: Self-pay | Admitting: Obstetrics and Gynecology

## 2013-06-04 LAB — HM PAP SMEAR: HM Pap smear: NORMAL

## 2013-06-07 ENCOUNTER — Ambulatory Visit (INDEPENDENT_AMBULATORY_CARE_PROVIDER_SITE_OTHER): Payer: Medicare Other | Admitting: Internal Medicine

## 2013-06-07 ENCOUNTER — Encounter: Payer: Self-pay | Admitting: Internal Medicine

## 2013-06-07 VITALS — BP 112/80 | HR 71 | Temp 97.5°F | Ht 59.5 in | Wt 145.0 lb

## 2013-06-07 DIAGNOSIS — R079 Chest pain, unspecified: Secondary | ICD-10-CM

## 2013-06-07 LAB — COMPREHENSIVE METABOLIC PANEL
ALT: 20 U/L (ref 0–35)
AST: 28 U/L (ref 0–37)
Alkaline Phosphatase: 62 U/L (ref 39–117)
CO2: 28 mEq/L (ref 19–32)
Creat: 0.82 mg/dL (ref 0.50–1.10)
Sodium: 137 mEq/L (ref 135–145)
Total Bilirubin: 0.3 mg/dL (ref 0.3–1.2)

## 2013-06-07 LAB — LIPID PANEL
Cholesterol: 187 mg/dL (ref 0–200)
LDL Cholesterol: 88 mg/dL (ref 0–99)
Total CHOL/HDL Ratio: 3.1 Ratio
VLDL: 38 mg/dL (ref 0–40)

## 2013-06-07 LAB — CBC WITH DIFFERENTIAL/PLATELET
Basophils Absolute: 0.1 10*3/uL (ref 0.0–0.1)
Eosinophils Absolute: 0.1 10*3/uL (ref 0.0–0.7)
Eosinophils Relative: 2 % (ref 0–5)
Lymphocytes Relative: 36 % (ref 12–46)
MCH: 29.4 pg (ref 26.0–34.0)
MCV: 86.2 fL (ref 78.0–100.0)
Neutro Abs: 3.5 10*3/uL (ref 1.7–7.7)
Platelets: 287 10*3/uL (ref 150–400)
RDW: 14.5 % (ref 11.5–15.5)
WBC: 6.7 10*3/uL (ref 4.0–10.5)

## 2013-06-07 NOTE — Progress Notes (Signed)
Subjective:    Patient ID: Sandy Merritt, female    DOB: Apr 29, 1932, 79 y.o.   MRN: [id number]  HPI 79 year old female with history of rheumatoid arthritis presents for acute visit. This was initially scheduled as her annual exam but on arrival she reported chest pain. She reports that she has had chest pain described as "heaviness "intermittently over the last 6 months. This often occurs when she is lying down to go to bed at night. It is sometimes associated with palpitations described as forceful rapid heartbeats and shortness of breath. She denies nausea, diaphoresis. She occasionally has symptoms with exertion. Symptoms typically resolve after a few minutes without any intervention, except for occasionally taking aspirin. She has no known history of heart disease. Her father died of heart disease at age 77.  Outpatient Encounter Prescriptions as of 06/07/2013  Medication Sig  . cholecalciferol (VITAMIN D) 1000 UNITS tablet Take 1,000 Units by mouth daily.  Marland Kitchen esomeprazole (NEXIUM) 40 MG capsule Take 1 capsule (40 mg total) by mouth daily before breakfast.  . ESZOPICLONE 3 MG tablet Take 1 tablet by mouth At bedtime.  . hydroxychloroquine (PLAQUENIL) 200 MG tablet Take 1 tablet by mouth BID times 48H.  Marland Kitchen LYRICA 150 MG capsule Take 1 tablet by mouth Twice daily.  . Multiple Vitamins-Minerals (MULTIVITAMIN WITH MINERALS) tablet Take 1 tablet by mouth daily.  . polyethylene glycol powder (GLYCOLAX/MIRALAX) powder Take 9 g by mouth 3 (three) times a week. Take 1/2 capful three times weekly.  Marland Kitchen PREMARIN 0.45 MG tablet Take 1 tablet by mouth Daily.   BP 112/80  Pulse 71  Temp(Src) 97.5 F (36.4 C) (Oral)  Ht 4' 11.5" (1.511 m)  Wt 145 lb (65.772 kg)  BMI 28.81 kg/m2  SpO2 98%  Review of Systems  Constitutional: Negative for fever, chills, appetite change, fatigue and unexpected weight change.  HENT: Negative for congestion, ear pain, sinus pressure, sore throat, trouble swallowing and  voice change.   Eyes: Negative for visual disturbance.  Respiratory: Positive for shortness of breath. Negative for cough, wheezing and stridor.   Cardiovascular: Positive for chest pain and palpitations. Negative for leg swelling.  Gastrointestinal: Negative for nausea, vomiting, abdominal pain, diarrhea, constipation, blood in stool, abdominal distention and anal bleeding.  Genitourinary: Negative for dysuria and flank pain.  Musculoskeletal: Negative for arthralgias, gait problem, myalgias and neck pain.  Skin: Negative for color change and rash.  Neurological: Negative for dizziness and headaches.  Hematological: Negative for adenopathy. Does not bruise/bleed easily.  Psychiatric/Behavioral: Negative for suicidal ideas, sleep disturbance and dysphoric mood. The patient is not nervous/anxious.        Objective:   Physical Exam  Constitutional: She is oriented to person, place, and time. She appears well-developed and well-nourished. No distress.  HENT:  Head: Normocephalic and atraumatic.  Right Ear: External ear normal.  Left Ear: External ear normal.  Nose: Nose normal.  Mouth/Throat: Oropharynx is clear and moist. No oropharyngeal exudate.  Eyes: Conjunctivae are normal. Pupils are equal, round, and reactive to light. Right eye exhibits no discharge. Left eye exhibits no discharge. No scleral icterus.  Neck: Normal range of motion. Neck supple. No tracheal deviation present. No thyromegaly present.  Cardiovascular: Normal rate, regular rhythm, normal heart sounds and intact distal pulses.  Exam reveals no gallop and no friction rub.   No murmur heard. Pulmonary/Chest: Effort normal and breath sounds normal. No accessory muscle usage. Not tachypneic. No respiratory distress. She has no decreased breath sounds. She has  no wheezes. She has no rhonchi. She has no rales. She exhibits no tenderness.  Musculoskeletal: Normal range of motion. She exhibits no edema and no tenderness.   Lymphadenopathy:    She has no cervical adenopathy.  Neurological: She is alert and oriented to person, place, and time. No cranial nerve deficit. She exhibits normal muscle tone. Coordination normal.  Skin: Skin is warm and dry. No rash noted. She is not diaphoretic. No erythema. No pallor.  Psychiatric: She has a normal mood and affect. Her behavior is normal. Judgment and thought content normal.          Assessment & Plan:

## 2013-06-07 NOTE — Progress Notes (Signed)
Pre-visit discussion using our clinic review tool. No additional management support is needed unless otherwise documented below in the visit note.  

## 2013-06-07 NOTE — Assessment & Plan Note (Signed)
6 month history of intermittent chest pain described as "heaviness" concerning for CAD. Will check CMP, lipids, CBC, TSH with labs today. EKG normal today. Will set up cardiology evaluation. Question if ECHO or Myoview might be helpful for further risk assessment. Given her history of RA, not clear that she would tolerate treadmill stress test.

## 2013-06-08 LAB — TSH: TSH: 2.448 u[IU]/mL (ref 0.350–4.500)

## 2013-06-10 ENCOUNTER — Ambulatory Visit: Payer: 59 | Admitting: Cardiovascular Disease

## 2013-06-11 ENCOUNTER — Ambulatory Visit: Payer: 59 | Admitting: Cardiovascular Disease

## 2013-06-11 ENCOUNTER — Encounter: Payer: Self-pay | Admitting: *Deleted

## 2013-06-18 ENCOUNTER — Ambulatory Visit (INDEPENDENT_AMBULATORY_CARE_PROVIDER_SITE_OTHER): Payer: Medicare Other | Admitting: Cardiovascular Disease

## 2013-06-18 ENCOUNTER — Encounter: Payer: Self-pay | Admitting: Cardiovascular Disease

## 2013-06-18 VITALS — BP 114/62 | HR 69 | Ht 59.5 in | Wt 145.5 lb

## 2013-06-18 DIAGNOSIS — R002 Palpitations: Secondary | ICD-10-CM

## 2013-06-18 DIAGNOSIS — I10 Essential (primary) hypertension: Secondary | ICD-10-CM

## 2013-06-18 DIAGNOSIS — R079 Chest pain, unspecified: Secondary | ICD-10-CM

## 2013-06-18 NOTE — Assessment & Plan Note (Signed)
Chest pain is overall atypical. EKG did not show any acute changes. Due to her age and risk factors, I recommend further evaluation with a pharmacologic nuclear stress test. She is not able to exercise on a treadmill due to rheumatoid arthritis.

## 2013-06-18 NOTE — Patient Instructions (Addendum)
Your physician has requested that you have a lexiscan myoview. For further information please visit [email or web address] Please follow instruction sheet, as given.  Follow up as needed.   [facility] MYOVIEW  Your caregiver has ordered a Stress Test with nuclear imaging. The purpose of this test is to evaluate the blood supply to your heart muscle. This procedure is referred to as a "Non-Invasive Stress Test." This is because other than having an IV started in your vein, nothing is inserted or "invades" your body. Cardiac stress tests are done to find areas of poor blood flow to the heart by determining the extent of coronary artery disease (CAD). Some patients exercise on a treadmill, which naturally increases the blood flow to your heart, while others who are  unable to walk on a treadmill due to physical limitations have a pharmacologic/chemical stress agent called Lexiscan . This medicine will mimic walking on a treadmill by temporarily increasing your coronary blood flow.   Please note: these test may take anywhere between 2-4 hours to complete  PLEASE REPORT TO [facility] MEDICAL MALL ENTRANCE  THE VOLUNTEERS AT THE FIRST DESK WILL DIRECT YOU WHERE TO GO  Date of Procedure:_______________12/11/14____________  Arrival Time for Procedure:_________09:45________________  PLEASE NOTIFY THE OFFICE AT LEAST 24 HOURS IN ADVANCE IF YOU ARE UNABLE TO KEEP YOUR APPOINTMENT.  [phone] AND  PLEASE NOTIFY NUCLEAR MEDICINE AT [facility] AT LEAST 24 HOURS IN ADVANCE IF YOU ARE UNABLE TO KEEP YOUR APPOINTMENT. [phone]  How to prepare for your Myoview test:  1. Do not eat or drink after midnight 2. No caffeine for 24 hours prior to test 3. No smoking 24 hours prior to test. 4. Your medication may be taken with water.  If your doctor stopped a medication because of this test, do not take that medication. 5. Ladies, please do not wear dresses.  Skirts or pants are appropriate. Please wear a short sleeve  shirt. 6. No perfume, cologne or lotion. 7. Wear comfortable walking shoes. No heels!

## 2013-06-18 NOTE — Assessment & Plan Note (Signed)
This is mild overall. Her Holter monitor can be considered in the future if symptoms worsen.

## 2013-06-18 NOTE — Progress Notes (Signed)
Primary care physician: Dr. Victorino Dike walker  HPI  This is 79 year old female who was referred for evaluation of chest pain. She has known history of rheumatoid arthritis. She has no history or diabetes, hypertension or hyperlipidemia. She does not smoke and has no family history of premature coronary artery disease. Her father died at the age of 15 of myocardial infarction. She reports prolonged history of chest achiness substernally with no radiation which happens mostly at night.   It is sometimes associated with palpitations described as forceful rapid heartbeats and shortness of breath.  Symptoms typically resolve after a few minutes without any intervention, except for occasionally taking aspirin. These symptoms do not particularly worsen with physical activities.  Allergies  Allergen Reactions  . Codeine     REACTION: nausea and vomiting  . Propoxyphene Hcl     "darvon"....Marland KitchenREACTION: nausea and vomiting  . Propoxyphene-Acetaminophen     Darvocet......Marland KitchenREACTION: nausea and vomiting     Current Outpatient Prescriptions on File Prior to Visit  Medication Sig Dispense Refill  . esomeprazole (NEXIUM) 40 MG capsule Take 1 capsule (40 mg total) by mouth daily before breakfast.  30 capsule  3  . ESZOPICLONE 3 MG tablet Take 1 tablet by mouth At bedtime.      . hydroxychloroquine (PLAQUENIL) 200 MG tablet Take 1 tablet by mouth BID times 48H.      Marland Kitchen LYRICA 150 MG capsule Take 1 tablet by mouth Twice daily.      . Multiple Vitamins-Minerals (MULTIVITAMIN WITH MINERALS) tablet Take 1 tablet by mouth daily.      Marland Kitchen PREMARIN 0.45 MG tablet Take 1 tablet by mouth Daily.       No current facility-administered medications on file prior to visit.     Past Medical History  Diagnosis Date  . Chicken pox   . Headaches, cluster   . GERD (gastroesophageal reflux disease)   . Glaucoma   . Urine incontinence   . Arthritis     rheumatoid  . Lupus      Past Surgical History  Procedure  Laterality Date  . Cholecystectomy  1970  . Appendectomy  1970  . Tonsillectomy and adenoidectomy  1955  . Abdominal hysterectomy    . Vaginal delivery      1  . Cesarean section      2  . Foot surgery  years ago    "toes"/"heart stopped during surgery" per pt.only(not medical staff)  . Cervical spine surgery      ant. neck/done x2/ screws out now     Family History  Problem Relation Age of Onset  . Arthritis Mother   . Diabetes Mother   . Heart disease Mother   . Arthritis Father   . Heart disease Father   . Heart attack Father   . Diabetes Grandchild   . Drug abuse Maternal Aunt   . Colon cancer Neg Hx      History   Social History  . Marital Status: Divorced    Spouse Name: N/A    Number of Children: N/A  . Years of Education: N/A   Occupational History  . Not on file.   Social History Main Topics  . Smoking status: Never Smoker   . Smokeless tobacco: Never Used  . Alcohol Use: No  . Drug Use: No  . Sexual Activity: Not on file   Other Topics Concern  . Not on file   Social History Narrative   Lives in [place] alone. Volunteers at [employer]  Divorced.     ROS A 10 point review of system was performed. It is negative other than that mentioned in the history of present illness.   PHYSICAL EXAM   BP 114/62  Pulse 69  Ht 4' 11.5" (1.511 m)  Wt 145 lb 8 oz (65.998 kg)  BMI 28.91 kg/m2 Constitutional: She is oriented to person, place, and time. She appears well-developed and well-nourished. No distress.  HENT: No nasal discharge.  Head: Normocephalic and atraumatic.  Eyes: Pupils are equal and round. No discharge.  Neck: Normal range of motion. Neck supple. No JVD present. No thyromegaly present.  Cardiovascular: Normal rate, regular rhythm, normal heart sounds. Exam reveals no gallop and no friction rub. No murmur heard.  Pulmonary/Chest: Effort normal and breath sounds normal. No stridor. No respiratory distress. She has no wheezes. She has no  rales. She exhibits no tenderness.  Abdominal: Soft. Bowel sounds are normal. She exhibits no distension. There is no tenderness. There is no rebound and no guarding.  Musculoskeletal: Normal range of motion. She exhibits no edema and no tenderness.  Neurological: She is alert and oriented to person, place, and time. Coordination normal.  Skin: Skin is warm and dry. No rash noted. She is not diaphoretic. No erythema. No pallor.  Psychiatric: She has a normal mood and affect. Her behavior is normal. Judgment and thought content normal.     JWJ:XBJYN  Rhythm  -Nonspecific ST depression  -Nondiagnostic.   ABNORMAL    ASSESSMENT AND PLAN

## 2013-06-19 ENCOUNTER — Telehealth: Payer: Self-pay | Admitting: Internal Medicine

## 2013-06-19 NOTE — Telephone Encounter (Signed)
Spoke with patient and she is having problems with constipation. She is drinking prune juice, grape juice and tried a few OTC things without relief. Patient will try Miralax 17 grams daily to BID. Discussed with patient how to increase or decrease dose depending on her needs.

## 2013-06-27 ENCOUNTER — Ambulatory Visit: Payer: Self-pay | Admitting: Cardiovascular Disease

## 2013-06-27 DIAGNOSIS — R0602 Shortness of breath: Secondary | ICD-10-CM

## 2013-06-27 DIAGNOSIS — R079 Chest pain, unspecified: Secondary | ICD-10-CM

## 2013-06-28 ENCOUNTER — Other Ambulatory Visit: Payer: Self-pay

## 2013-06-28 DIAGNOSIS — R079 Chest pain, unspecified: Secondary | ICD-10-CM

## 2013-07-23 ENCOUNTER — Ambulatory Visit (INDEPENDENT_AMBULATORY_CARE_PROVIDER_SITE_OTHER): Payer: Medicare Other | Admitting: Internal Medicine

## 2013-07-23 ENCOUNTER — Encounter (INDEPENDENT_AMBULATORY_CARE_PROVIDER_SITE_OTHER): Payer: Self-pay

## 2013-07-23 ENCOUNTER — Encounter: Payer: Self-pay | Admitting: Internal Medicine

## 2013-07-23 VITALS — BP 118/80 | HR 70 | Temp 97.5°F | Ht 59.0 in | Wt 145.0 lb

## 2013-07-23 DIAGNOSIS — L989 Disorder of the skin and subcutaneous tissue, unspecified: Secondary | ICD-10-CM

## 2013-07-23 DIAGNOSIS — Z0000 Encounter for general adult medical examination without abnormal findings: Secondary | ICD-10-CM

## 2013-07-23 DIAGNOSIS — Z1239 Encounter for other screening for malignant neoplasm of breast: Secondary | ICD-10-CM

## 2013-07-23 DIAGNOSIS — R079 Chest pain, unspecified: Secondary | ICD-10-CM

## 2013-07-23 NOTE — Progress Notes (Signed)
Subjective:    Patient ID: Sandy Merritt, female    DOB: 1931/07/26, 80 y.o.   MRN: [id number]  HPI The patient is here for annual Medicare wellness examination and management of other chronic and acute problems.   The risk factors are reflected in the social history.  The roster of all physicians providing medical care to patient - is listed in the Snapshot section of the chart.  Activities of daily living:  The patient is 100% independent in all ADLs: dressing, toileting, feeding as well as independent mobility. Lives alone.  Home safety : The patient has smoke detectors in the home. Has home security system. They wear seatbelts.  There are no firearms at home. There is no violence in the home.   There is no risks for hepatitis, STDs or HIV. There is no history of blood transfusion. They have no travel history to infectious disease endemic areas of the world.  The patient has seen their dentist in the last six month. Dentist - Dr. Vickki Muff They have seen their eye doctor in the last year. Opthalmologist - Dr. Linton Flemings They have deferred audiologic testing in the last year.   They do not  have excessive sun exposure. Discussed the need for sun protection: hats, long sleeves and use of sunscreen if there is significant sun exposure. Dermatologist - Dr. Nevada Crane GYN - Dr. Helane Rima GI - Dr. Johna Roles Rheum - Dr. Corena Pilgrim  Diet: the importance of a healthy diet is discussed. They do have a healthy diet.  The benefits of regular aerobic exercise were discussed. She exercises by walking.  Works as Psychologist, occupational at [employer].  Depression screen: there are no signs or vegative symptoms of depression- irritability, change in appetite, anhedonia, sadness/tearfullness.  Cognitive assessment: the patient manages all their financial and personal affairs and is actively engaged. They could relate day,date,year and events.  HCPOA - daughter, Lucile Shutters  The following portions of the patient's history  were reviewed and updated as appropriate: allergies, current medications, past family history, past medical history,  past surgical history, past social history  and problem list.  Visual acuity was not assessed per patient preference since she has regular follow up with her ophthalmologist. Hearing and body mass index were assessed and reviewed.   During the course of the visit the patient was educated and counseled about appropriate screening and preventive services including : fall prevention , diabetes screening, nutrition counseling, colorectal cancer screening, and recommended immunizations.    She was recently seen and evaluated for anterior chest pain. She was seen by cardiology and underwent nuclear stress test which was normal. She reports that symptoms of chest pain have resolved. She denies any palpitations, shortness of breath, or other concerns.  Outpatient Encounter Prescriptions as of 07/23/2013  Medication Sig  . aspirin 81 MG tablet Take 81 mg by mouth as needed for pain.  Marland Kitchen esomeprazole (NEXIUM) 40 MG capsule Take 1 capsule (40 mg total) by mouth daily before breakfast.  . ESZOPICLONE 3 MG tablet Take 1 tablet by mouth At bedtime.  . hydroxychloroquine (PLAQUENIL) 200 MG tablet Take 1 tablet by mouth BID times 48H.  Marland Kitchen LYRICA 150 MG capsule Take 1 tablet by mouth Twice daily.  . Multiple Vitamins-Minerals (MULTIVITAMIN WITH MINERALS) tablet Take 1 tablet by mouth daily.  Marland Kitchen nystatin ointment (MYCOSTATIN) Apply 1 application topically as needed.   Marland Kitchen PREMARIN 0.45 MG tablet Take 1 tablet by mouth Daily.   BP 118/80  Pulse 70  Temp(Src) 97.5  F (36.4 C) (Oral)  Ht 4\' 11"  (1.499 m)  Wt 145 lb (65.772 kg)  BMI 29.27 kg/m2  SpO2 97%   Review of Systems  Constitutional: Negative for fever, chills, appetite change, fatigue and unexpected weight change.  HENT: Negative for congestion, ear pain, sinus pressure, sore throat, trouble swallowing and voice change.   Eyes: Negative  for visual disturbance.  Respiratory: Negative for cough, shortness of breath, wheezing and stridor.   Cardiovascular: Negative for chest pain, palpitations and leg swelling.  Gastrointestinal: Negative for nausea, vomiting, abdominal pain, diarrhea, constipation, blood in stool, abdominal distention and anal bleeding.  Genitourinary: Negative for dysuria and flank pain.  Musculoskeletal: Negative for arthralgias, gait problem, myalgias and neck pain.  Skin: Negative for color change and rash.  Neurological: Negative for dizziness and headaches.  Hematological: Negative for adenopathy. Does not bruise/bleed easily.  Psychiatric/Behavioral: Negative for suicidal ideas, sleep disturbance and dysphoric mood. The patient is not nervous/anxious.        Objective:   Physical Exam  Constitutional: She is oriented to person, place, and time. She appears well-developed and well-nourished. No distress.  HENT:  Head: Normocephalic and atraumatic.  Right Ear: External ear normal.  Left Ear: External ear normal.  Nose: Nose normal.  Mouth/Throat: Oropharynx is clear and moist. No oropharyngeal exudate.  Eyes: Conjunctivae are normal. Pupils are equal, round, and reactive to light. Right eye exhibits no discharge. Left eye exhibits no discharge. No scleral icterus.  Neck: Normal range of motion. Neck supple. No tracheal deviation present. No thyromegaly present.  Cardiovascular: Normal rate, regular rhythm, normal heart sounds and intact distal pulses.  Exam reveals no gallop and no friction rub.   No murmur heard. Pulmonary/Chest: Effort normal and breath sounds normal. No accessory muscle usage. Not tachypneic. No respiratory distress. She has no decreased breath sounds. She has no wheezes. She has no rhonchi. She has no rales. She exhibits no tenderness.  Abdominal: Soft. Bowel sounds are normal. She exhibits no distension and no mass. There is no tenderness. There is no rebound and no guarding.    Musculoskeletal: Normal range of motion. She exhibits no edema and no tenderness.  Lymphadenopathy:    She has no cervical adenopathy.  Neurological: She is alert and oriented to person, place, and time. No cranial nerve deficit. She exhibits normal muscle tone. Coordination normal.  Skin: Skin is warm and dry. No rash noted. She is not diaphoretic. No erythema. No pallor.     Psychiatric: She has a normal mood and affect. Her behavior is normal. Judgment and thought content normal.          Assessment & Plan:

## 2013-07-23 NOTE — Progress Notes (Signed)
Pre-visit discussion using our clinic review tool. No additional management support is needed unless otherwise documented below in the visit note.  

## 2013-07-24 DIAGNOSIS — Z1239 Encounter for other screening for malignant neoplasm of breast: Secondary | ICD-10-CM | POA: Insufficient documentation

## 2013-07-24 DIAGNOSIS — Z0000 Encounter for general adult medical examination without abnormal findings: Secondary | ICD-10-CM | POA: Insufficient documentation

## 2013-07-24 DIAGNOSIS — L989 Disorder of the skin and subcutaneous tissue, unspecified: Secondary | ICD-10-CM | POA: Insufficient documentation

## 2013-07-24 NOTE — Assessment & Plan Note (Signed)
Symptoms of chest pain have resolved. Reviewed records on nuclear stress test today with patient. Question if she may have had acid reflux contributing to symptoms. Will monitor for recurrence.

## 2013-07-24 NOTE — Assessment & Plan Note (Signed)
General medical exam normal today except as noted. Breast and pelvic exam deferred as recently completed by her gynecologist. Mammogram ordered. Pap smear reviewed and up-to-date. Colonoscopy up to date. Immunizations are up-to-date. Encouraged healthy diet and regular physical activity. Reviewed recent labs which were normal.

## 2013-07-24 NOTE — Assessment & Plan Note (Signed)
Skin lesion of the right chest wall concerning for squamous cell carcinoma. Reportedly, this area was biopsied in the past however records are not available. Given persistence of this lesion, recommended dermatology evaluation with possible repeat biopsy. Referral placed.

## 2013-07-26 ENCOUNTER — Telehealth: Payer: Self-pay | Admitting: Internal Medicine

## 2013-07-26 NOTE — Telephone Encounter (Signed)
Refills needed: Lyrica 150 mg take 1 capsule by mouth twice daily.   Zopiclone 3 mg take 1 tablet at bedtime. Nexium 40 mg take before breakfast in a.m.  States refills are required.

## 2013-07-29 MED ORDER — ESZOPICLONE 3 MG PO TABS: 3.0000 mg | ORAL_TABLET | Freq: Every day | ORAL | Status: DC

## 2013-07-29 MED ORDER — PREGABALIN 150 MG PO CAPS: 150.0000 mg | ORAL_CAPSULE | Freq: Two times a day (BID) | ORAL | Status: DC

## 2013-07-29 MED ORDER — ESOMEPRAZOLE MAGNESIUM 40 MG PO CPDR: 40.0000 mg | DELAYED_RELEASE_CAPSULE | Freq: Every day | ORAL | Status: DC

## 2013-07-29 NOTE — Telephone Encounter (Signed)
Prescriptions faxed to [facility].

## 2013-07-30 ENCOUNTER — Other Ambulatory Visit: Payer: Self-pay | Admitting: Internal Medicine

## 2013-08-09 ENCOUNTER — Encounter: Payer: Self-pay | Admitting: Emergency Medicine

## 2013-10-15 ENCOUNTER — Other Ambulatory Visit: Payer: Self-pay | Admitting: Internal Medicine

## 2013-10-18 ENCOUNTER — Other Ambulatory Visit: Payer: Self-pay | Admitting: Internal Medicine

## 2013-10-23 ENCOUNTER — Telehealth: Payer: Self-pay | Admitting: Internal Medicine

## 2013-10-23 NOTE — Telephone Encounter (Signed)
Left vm.  States she needs to know what to do to get her refills done.  States they are pills she has been taking for many years.  States she was told she needed to call Dr. Gilford Rile and tell her "they are there so that she would be aware of that medicine that she has to have refilled."  No further details given.

## 2013-10-23 NOTE — Telephone Encounter (Signed)
Called and spoke with patient, she did not mention anything about a medication refill. She was confused on her medications, when she went to pick them up the names were listed in the generic vs the brand name she was used to seeing. Her Nexium has Esomeprazole on the bottle and Lunesta has Eszopiclone on it which confused her. Explained to patient what each medication was and told her to write on the bottle for the Eszopiclone "sleep" medicine to avoid making a mistake. She verbalized understanding and issue was clarified and resolved. Appointment was also schedule for patient because she stated she was having some problems holding her water at night. Stated it was not urgent, she just want to have it checked out.

## 2013-10-31 ENCOUNTER — Other Ambulatory Visit: Payer: Self-pay | Admitting: Internal Medicine

## 2013-10-31 NOTE — Telephone Encounter (Signed)
Electronic Rx request, please advise. 

## 2013-11-17 ENCOUNTER — Other Ambulatory Visit: Payer: Self-pay | Admitting: Internal Medicine

## 2013-11-19 ENCOUNTER — Other Ambulatory Visit: Payer: Self-pay | Admitting: Internal Medicine

## 2013-11-19 NOTE — Telephone Encounter (Signed)
Is this medication prescribed by you

## 2013-11-20 ENCOUNTER — Ambulatory Visit: Payer: Medicare Other | Admitting: Internal Medicine

## 2013-11-21 ENCOUNTER — Encounter: Payer: Self-pay | Admitting: Internal Medicine

## 2013-11-21 ENCOUNTER — Ambulatory Visit (INDEPENDENT_AMBULATORY_CARE_PROVIDER_SITE_OTHER): Payer: Medicare Other | Admitting: Internal Medicine

## 2013-11-21 VITALS — BP 130/72 | HR 63 | Temp 97.8°F | Resp 16 | Wt 145.8 lb

## 2013-11-21 DIAGNOSIS — M329 Systemic lupus erythematosus, unspecified: Secondary | ICD-10-CM

## 2013-11-21 DIAGNOSIS — R5383 Other fatigue: Secondary | ICD-10-CM

## 2013-11-21 DIAGNOSIS — R5381 Other malaise: Secondary | ICD-10-CM

## 2013-11-21 LAB — COMPREHENSIVE METABOLIC PANEL
ALT: 22 U/L (ref 0–35)
AST: 29 U/L (ref 0–37)
Albumin: 3.9 g/dL (ref 3.5–5.2)
Alkaline Phosphatase: 58 U/L (ref 39–117)
BILIRUBIN TOTAL: 0.7 mg/dL (ref 0.2–1.2)
BUN: 11 mg/dL (ref 6–23)
CO2: 30 mEq/L (ref 19–32)
CREATININE: 0.8 mg/dL (ref 0.4–1.2)
Calcium: 9.1 mg/dL (ref 8.4–10.5)
Chloride: 102 mEq/L (ref 96–112)
GFR: 72.4 mL/min (ref >60.00)
GLUCOSE: 79 mg/dL (ref 70–99)
Potassium: 4 mEq/L (ref 3.5–5.1)
Sodium: 139 mEq/L (ref 135–145)
Total Protein: 7 g/dL (ref 6.0–8.3)

## 2013-11-21 LAB — CBC WITH DIFFERENTIAL/PLATELET
Basophils Absolute: 0 10*3/uL (ref 0.0–0.1)
Basophils Relative: 0.5 % (ref 0.0–3.0)
Eosinophils Absolute: 0.1 10*3/uL (ref 0.0–0.7)
Eosinophils Relative: 1.4 % (ref 0.0–5.0)
HEMATOCRIT: 35 % — AB (ref 36.0–46.0)
Hemoglobin: 11.6 g/dL — ABNORMAL LOW (ref 12.0–15.0)
LYMPHS PCT: 34.5 % (ref 12.0–46.0)
Lymphs Abs: 2.6 10*3/uL (ref 0.7–4.0)
MCHC: 33.3 g/dL (ref 30.0–36.0)
MCV: 87.9 fl (ref 78.0–100.0)
MONOS PCT: 7 % (ref 3.0–12.0)
Monocytes Absolute: 0.5 10*3/uL (ref 0.1–1.0)
NEUTROS ABS: 4.2 10*3/uL (ref 1.4–7.7)
Neutrophils Relative %: 56.6 % (ref 43.0–77.0)
Platelets: 250 10*3/uL (ref 150.0–400.0)
RBC: 3.98 Mil/uL (ref 3.87–5.11)
RDW: 13.8 % (ref 11.5–15.5)
WBC: 7.5 10*3/uL (ref 4.0–10.5)

## 2013-11-21 LAB — C-REACTIVE PROTEIN: CRP: 0.9 mg/dL (ref 0.5–20.0)

## 2013-11-21 LAB — SEDIMENTATION RATE: SED RATE: 21 mm/h (ref 0–22)

## 2013-11-21 LAB — TSH: TSH: 2.04 u[IU]/mL (ref 0.35–4.50)

## 2013-11-21 NOTE — Assessment & Plan Note (Signed)
Generalized fatigue over last 2 weeks, likely related to increased arthralgia and interrupted sleep. Will check labs including CBC, CMP, TSH, ESR, CRP, ANA. Continue Lyrica for joint pain. As above, discussed potentially starting prednisone taper if markers of inflammation elevated.Follow up in 2 weeks.

## 2013-11-21 NOTE — Patient Instructions (Signed)
We will check labs today to help determine cause of fatigue.  Follow up in 2 weeks.

## 2013-11-21 NOTE — Assessment & Plan Note (Signed)
Recent worsening symptoms of diffuse arthralgia. Suspect lupus flare. Will check CBC, CMP, ESR, CRP with labs today. Discussed adding prednisone taper if markers of inflammation elevated. Will continue Plaquenil. Discussed importance of yearly eye exam on this medication.

## 2013-11-21 NOTE — Progress Notes (Signed)
Pre visit review using our clinic review tool, if applicable. No additional management support is needed unless otherwise documented below in the visit note. 

## 2013-11-21 NOTE — Progress Notes (Signed)
Subjective:    Patient ID: Sandy Merritt, female    DOB: 25-Nov-1931, 80 y.o.   MRN: [id number]  HPI 80YO female presents for acute visit.  Concerned about fatigue over last few weeks. Waking up at night with joint pain and urinary frequency. Taking Lunesta, but continues to wake up. Taking Lyrica with no improvement.  Review of Systems  Constitutional: Positive for fatigue. Negative for fever, chills, appetite change and unexpected weight change.  HENT: Negative for congestion, ear pain, sinus pressure, sore throat, trouble swallowing and voice change.   Eyes: Negative for visual disturbance.  Respiratory: Negative for cough, shortness of breath, wheezing and stridor.   Cardiovascular: Negative for chest pain, palpitations and leg swelling.  Gastrointestinal: Negative for nausea, vomiting, abdominal pain, diarrhea, constipation, blood in stool, abdominal distention and anal bleeding.  Genitourinary: Positive for frequency. Negative for dysuria, urgency, hematuria and flank pain.  Musculoskeletal: Positive for arthralgias. Negative for gait problem, myalgias and neck pain.  Skin: Negative for color change and rash.  Neurological: Negative for dizziness and headaches.  Hematological: Negative for adenopathy. Does not bruise/bleed easily.  Psychiatric/Behavioral: Negative for suicidal ideas, sleep disturbance and dysphoric mood. The patient is not nervous/anxious.        Objective:    BP 130/72  Pulse 63  Temp(Src) 97.8 F (36.6 C) (Oral)  Resp 16  Wt 145 lb 12 oz (66.112 kg)  SpO2 97% Physical Exam  Constitutional: She is oriented to person, place, and time. She appears well-developed and well-nourished. No distress.  HENT:  Head: Normocephalic and atraumatic.  Right Ear: External ear normal.  Left Ear: External ear normal.  Nose: Nose normal.  Mouth/Throat: Oropharynx is clear and moist. No oropharyngeal exudate.  Eyes: Conjunctivae are normal. Pupils are equal, round,  and reactive to light. Right eye exhibits no discharge. Left eye exhibits no discharge. No scleral icterus.  Neck: Normal range of motion. Neck supple. No tracheal deviation present. No thyromegaly present.  Cardiovascular: Normal rate, regular rhythm, normal heart sounds and intact distal pulses.  Exam reveals no gallop and no friction rub.   No murmur heard. Pulmonary/Chest: Effort normal and breath sounds normal. No accessory muscle usage. Not tachypneic. No respiratory distress. She has no decreased breath sounds. She has no wheezes. She has no rhonchi. She has no rales. She exhibits no tenderness.  Musculoskeletal: Normal range of motion. She exhibits no edema.       Lumbar back: She exhibits tenderness and pain. She exhibits normal range of motion and no bony tenderness.       Right hand: She exhibits normal range of motion. Tenderness: diffuse.       Left hand: She exhibits tenderness (diffuse). She exhibits normal range of motion.  Lymphadenopathy:    She has no cervical adenopathy.  Neurological: She is alert and oriented to person, place, and time. No cranial nerve deficit. She exhibits normal muscle tone. Coordination normal.  Skin: Skin is warm and dry. No rash noted. She is not diaphoretic. No erythema. No pallor.  Psychiatric: She has a normal mood and affect. Her behavior is normal. Judgment and thought content normal.          Assessment & Plan:   Problem List Items Addressed This Visit   Lupus - Primary     Recent worsening symptoms of diffuse arthralgia. Suspect lupus flare. Will check CBC, CMP, ESR, CRP with labs today. Discussed adding prednisone taper if markers of inflammation elevated. Will continue Plaquenil. Discussed  importance of yearly eye exam on this medication.    Relevant Orders      ANA      Sed Rate (ESR)      C-reactive protein   Other malaise and fatigue     Generalized fatigue over last 2 weeks, likely related to increased arthralgia and interrupted  sleep. Will check labs including CBC, CMP, TSH, ESR, CRP, ANA. Continue Lyrica for joint pain. As above, discussed potentially starting prednisone taper if markers of inflammation elevated.Follow up in 2 weeks.    Relevant Orders      CBC with Differential      Comprehensive metabolic panel      TSH      CULTURE, URINE COMPREHENSIVE       Return in about 2 weeks (around 12/05/2013) for Recheck.

## 2013-11-22 LAB — ANA: Anti Nuclear Antibody(ANA): NEGATIVE

## 2013-11-24 LAB — CULTURE, URINE COMPREHENSIVE: Colony Count: 50000

## 2013-11-25 ENCOUNTER — Other Ambulatory Visit: Payer: Self-pay | Admitting: *Deleted

## 2013-11-25 NOTE — Telephone Encounter (Signed)
Ok refill and frequency daily or bid?

## 2013-11-26 MED ORDER — HYDROXYCHLOROQUINE SULFATE 200 MG PO TABS
200.0000 mg | ORAL_TABLET | Freq: Two times a day (BID) | ORAL | Status: DC
Start: ? — End: 2014-01-27

## 2013-11-26 NOTE — Telephone Encounter (Signed)
Left message for pt to return my call.

## 2013-11-26 NOTE — Telephone Encounter (Signed)
Generally, I prescribe 200mg  twice daily, but we should confirm with pt that this is dose she has been taking. Initially, this was prescribed by Dr. Jefm Bryant.

## 2013-11-26 NOTE — Telephone Encounter (Signed)
Pt left VM, confirmed she takes bid.  Rx sent to pharmacy by escript

## 2013-11-26 NOTE — Telephone Encounter (Signed)
The patient returned your call. °

## 2013-11-27 ENCOUNTER — Other Ambulatory Visit: Payer: Self-pay | Admitting: *Deleted

## 2013-11-27 MED ORDER — TRAMADOL HCL 50 MG PO TABS: 50.0000 mg | ORAL_TABLET | Freq: Three times a day (TID) | ORAL | Status: DC | PRN

## 2013-11-27 MED ORDER — CIPROFLOXACIN HCL 500 MG PO TABS: 500.0000 mg | ORAL_TABLET | Freq: Two times a day (BID) | ORAL | Status: DC

## 2013-11-27 NOTE — Addendum Note (Signed)
Addended by: Ronette Deter A on: 11/27/2013 10:03 AM   Modules accepted: Orders

## 2013-12-12 ENCOUNTER — Ambulatory Visit (INDEPENDENT_AMBULATORY_CARE_PROVIDER_SITE_OTHER): Payer: Medicare Other | Admitting: Internal Medicine

## 2013-12-12 ENCOUNTER — Encounter: Payer: Self-pay | Admitting: Internal Medicine

## 2013-12-12 VITALS — BP 130/82 | HR 67 | Temp 98.2°F | Ht 59.0 in | Wt 146.0 lb

## 2013-12-12 DIAGNOSIS — R5381 Other malaise: Secondary | ICD-10-CM

## 2013-12-12 DIAGNOSIS — R82998 Other abnormal findings in urine: Secondary | ICD-10-CM

## 2013-12-12 DIAGNOSIS — M329 Systemic lupus erythematosus, unspecified: Secondary | ICD-10-CM

## 2013-12-12 DIAGNOSIS — R8290 Unspecified abnormal findings in urine: Secondary | ICD-10-CM

## 2013-12-12 DIAGNOSIS — R5383 Other fatigue: Secondary | ICD-10-CM

## 2013-12-12 MED ORDER — ESTROGENS, CONJUGATED 0.625 MG/GM VA CREA: 1.0000 | TOPICAL_CREAM | VAGINAL | Status: DC

## 2013-12-12 MED ORDER — CYANOCOBALAMIN 1000 MCG/ML IJ SOLN: 1000.0000 ug | Freq: Once | INTRAMUSCULAR | Status: DC

## 2013-12-12 NOTE — Assessment & Plan Note (Signed)
Will repeat Urinalysis to be sure infection has cleared given recent cloudy urine noted by pt.

## 2013-12-12 NOTE — Assessment & Plan Note (Signed)
Symptoms likely secondary to recent UTI. Symptoms have improved. Will continue to monitor.

## 2013-12-12 NOTE — Assessment & Plan Note (Signed)
Recent labs not suggestive of flare. Doing well. Continue Plaquenil. Continue tramadol prn. Follow up 3 months and prn.

## 2013-12-12 NOTE — Progress Notes (Signed)
Subjective:    Patient ID: Sandy Merritt, female    DOB: 13-Sep-1931, 80 y.o.   MRN: [id number]  HPI 80YO female presents for follow up. Recently seen for fatigue and increased joint pain. Symptoms of fatigue have improved.  Lupus - Joint pain has improved since last visit. Continues to take tramadol with some improvement.  UTI - Continues on Cipro. Notes urine is green-yellow in color. No recent dysuria, flank pain, fever, chills.  Review of Systems  Constitutional: Negative for fever, chills, appetite change, fatigue and unexpected weight change.  HENT: Negative for congestion, ear pain, sinus pressure, sore throat, trouble swallowing and voice change.   Eyes: Negative for visual disturbance.  Respiratory: Negative for cough, shortness of breath, wheezing and stridor.   Cardiovascular: Negative for chest pain, palpitations and leg swelling.  Gastrointestinal: Negative for nausea, vomiting, abdominal pain, diarrhea, constipation, blood in stool, abdominal distention and anal bleeding.  Genitourinary: Negative for dysuria, urgency, frequency, flank pain and decreased urine volume.  Musculoskeletal: Positive for arthralgias and myalgias. Negative for gait problem and neck pain.  Skin: Negative for color change and rash.  Neurological: Negative for dizziness and headaches.  Hematological: Negative for adenopathy. Does not bruise/bleed easily.  Psychiatric/Behavioral: Negative for suicidal ideas, sleep disturbance and dysphoric mood. The patient is not nervous/anxious.        Objective:    BP 130/82  Pulse 67  Temp(Src) 98.2 F (36.8 C) (Oral)  Ht 4\' 11"  (1.499 m)  Wt 146 lb (66.225 kg)  BMI 29.47 kg/m2  SpO2 97% Physical Exam  Constitutional: She is oriented to person, place, and time. She appears well-developed and well-nourished. No distress.  HENT:  Head: Normocephalic and atraumatic.  Right Ear: External ear normal.  Left Ear: External ear normal.  Nose: Nose  normal.  Mouth/Throat: Oropharynx is clear and moist. No oropharyngeal exudate.  Eyes: Conjunctivae are normal. Pupils are equal, round, and reactive to light. Right eye exhibits no discharge. Left eye exhibits no discharge. No scleral icterus.  Neck: Normal range of motion. Neck supple. No tracheal deviation present. No thyromegaly present.  Cardiovascular: Normal rate, regular rhythm, normal heart sounds and intact distal pulses.  Exam reveals no gallop and no friction rub.   No murmur heard. Pulmonary/Chest: Effort normal and breath sounds normal. No accessory muscle usage. Not tachypneic. No respiratory distress. She has no decreased breath sounds. She has no wheezes. She has no rhonchi. She has no rales. She exhibits no tenderness.  Abdominal: There is no tenderness (no CVA tenderness).  Musculoskeletal: Normal range of motion. She exhibits no edema and no tenderness.  Lymphadenopathy:    She has no cervical adenopathy.  Neurological: She is alert and oriented to person, place, and time. No cranial nerve deficit. She exhibits normal muscle tone. Coordination normal.  Skin: Skin is warm and dry. No rash noted. She is not diaphoretic. No erythema. No pallor.  Psychiatric: She has a normal mood and affect. Her behavior is normal. Judgment and thought content normal.          Assessment & Plan:   Problem List Items Addressed This Visit     Unprioritized   Lupus - Primary     Recent labs not suggestive of flare. Doing well. Continue Plaquenil. Continue tramadol prn. Follow up 3 months and prn.    Other malaise and fatigue     Symptoms likely secondary to recent UTI. Symptoms have improved. Will continue to monitor.    Urine abnormality  Will repeat Urinalysis to be sure infection has cleared given recent cloudy urine noted by pt.    Relevant Orders      POCT Urinalysis Dipstick    Other Visit Diagnoses   B12 deficiency        Relevant Medications       cyanocobalamin  ((VITAMIN B-12)) injection 1,000 mcg        Return in about 6 months (around 06/14/2014) for Wellness Visit.

## 2013-12-12 NOTE — Progress Notes (Signed)
Pre visit review using our clinic review tool, if applicable. No additional management support is needed unless otherwise documented below in the visit note. 

## 2014-01-23 ENCOUNTER — Encounter: Payer: Self-pay | Admitting: Internal Medicine

## 2014-01-23 ENCOUNTER — Ambulatory Visit (INDEPENDENT_AMBULATORY_CARE_PROVIDER_SITE_OTHER): Payer: Medicare Other | Admitting: Internal Medicine

## 2014-01-23 VITALS — BP 110/68 | HR 67 | Temp 98.1°F | Ht 59.0 in | Wt 142.5 lb

## 2014-01-23 DIAGNOSIS — M79674 Pain in right toe(s): Secondary | ICD-10-CM

## 2014-01-23 DIAGNOSIS — Z23 Encounter for immunization: Secondary | ICD-10-CM

## 2014-01-23 DIAGNOSIS — M329 Systemic lupus erythematosus, unspecified: Secondary | ICD-10-CM

## 2014-01-23 DIAGNOSIS — I10 Essential (primary) hypertension: Secondary | ICD-10-CM

## 2014-01-23 DIAGNOSIS — M79609 Pain in unspecified limb: Secondary | ICD-10-CM

## 2014-01-23 DIAGNOSIS — D649 Anemia, unspecified: Secondary | ICD-10-CM

## 2014-01-23 LAB — CBC WITH DIFFERENTIAL/PLATELET
BASOS ABS: 0.1 10*3/uL (ref 0.0–0.1)
BASOS PCT: 1.1 % (ref 0.0–3.0)
Eosinophils Absolute: 0.1 10*3/uL (ref 0.0–0.7)
Eosinophils Relative: 1.1 % (ref 0.0–5.0)
HCT: 35.4 % — ABNORMAL LOW (ref 36.0–46.0)
HEMOGLOBIN: 11.6 g/dL — AB (ref 12.0–15.0)
LYMPHS PCT: 33.2 % (ref 12.0–46.0)
Lymphs Abs: 2.2 10*3/uL (ref 0.7–4.0)
MCHC: 32.9 g/dL (ref 30.0–36.0)
MCV: 87.9 fl (ref 78.0–100.0)
MONOS PCT: 6 % (ref 3.0–12.0)
Monocytes Absolute: 0.4 10*3/uL (ref 0.1–1.0)
NEUTROS ABS: 3.9 10*3/uL (ref 1.4–7.7)
Neutrophils Relative %: 58.6 % (ref 43.0–77.0)
Platelets: 263 10*3/uL (ref 150.0–400.0)
RBC: 4.03 Mil/uL (ref 3.87–5.11)
RDW: 14.3 % (ref 11.5–15.5)
WBC: 6.6 10*3/uL (ref 4.0–10.5)

## 2014-01-23 LAB — COMPREHENSIVE METABOLIC PANEL
ALT: 21 U/L (ref 0–35)
AST: 32 U/L (ref 0–37)
Albumin: 3.9 g/dL (ref 3.5–5.2)
Alkaline Phosphatase: 59 U/L (ref 39–117)
BUN: 14 mg/dL (ref 6–23)
CALCIUM: 9.6 mg/dL (ref 8.4–10.5)
CHLORIDE: 101 meq/L (ref 96–112)
CO2: 30 meq/L (ref 19–32)
Creatinine, Ser: 1 mg/dL (ref 0.4–1.2)
GFR: 54.85 mL/min — ABNORMAL LOW (ref >60.00)
Glucose, Bld: 127 mg/dL — ABNORMAL HIGH (ref 70–99)
Potassium: 4.1 mEq/L (ref 3.5–5.1)
Sodium: 136 mEq/L (ref 135–145)
Total Bilirubin: 0.3 mg/dL (ref 0.2–1.2)
Total Protein: 7.3 g/dL (ref 6.0–8.3)

## 2014-01-23 MED ORDER — TRAMADOL HCL 50 MG PO TABS: 50.0000 mg | ORAL_TABLET | Freq: Three times a day (TID) | ORAL | Status: DC | PRN

## 2014-01-23 NOTE — Progress Notes (Signed)
Pre visit review using our clinic review tool, if applicable. No additional management support is needed unless otherwise documented below in the visit note. 

## 2014-01-23 NOTE — Addendum Note (Signed)
Addended by: Vernetta Honey on: 01/23/2014 05:03 PM   Modules accepted: Orders

## 2014-01-23 NOTE — Assessment & Plan Note (Signed)
Mild anemia noted on previous labs. Will recheck CBC with labs today.

## 2014-01-23 NOTE — Assessment & Plan Note (Signed)
Chronic pain of second toe of right foot after previous surgery. (records not available) Will set up new podiatry evaluation

## 2014-01-23 NOTE — Patient Instructions (Signed)
Labs today.  Prevnar today.  Follow up for Wellness Visit in 6 months.

## 2014-01-23 NOTE — Assessment & Plan Note (Signed)
BP Readings from Last 3 Encounters:  01/23/14 110/68  12/12/13 130/82  11/21/13 130/72   BP well controlled. Will recheck renal function with labs today.

## 2014-01-23 NOTE — Progress Notes (Signed)
Subjective:    Patient ID: Sandy Merritt, female    DOB: May 05, 1932, 80 y.o.   MRN: [id number]  HPI 80YO female presents for follow up.  Feeling well. Occasional days with some joint pain, but pain is improved with Tramadol and she feels this is tolerable.  She is concerned about chronic pain in 2nd toe of right foot. Ongoing for years. Previously had surgical intervention to try to correct issue with right 2nd toe. Now toe seems to "flop upward." Has to tape to great toe.  Review of Systems  Constitutional: Positive for fatigue. Negative for fever, chills, appetite change and unexpected weight change.  Eyes: Negative for visual disturbance.  Respiratory: Negative for shortness of breath.   Cardiovascular: Negative for chest pain and leg swelling.  Gastrointestinal: Negative for nausea, vomiting, abdominal pain, diarrhea and constipation.  Musculoskeletal: Positive for arthralgias and myalgias.  Skin: Negative for color change and rash.  Hematological: Negative for adenopathy. Does not bruise/bleed easily.  Psychiatric/Behavioral: Negative for sleep disturbance and dysphoric mood. The patient is not nervous/anxious.        Objective:    BP 110/68  Pulse 67  Temp(Src) 98.1 F (36.7 C) (Oral)  Ht 4\' 11"  (1.499 m)  Wt 142 lb 8 oz (64.638 kg)  BMI 28.77 kg/m2  SpO2 95% Physical Exam  Constitutional: She is oriented to person, place, and time. She appears well-developed and well-nourished. No distress.  HENT:  Head: Normocephalic and atraumatic.  Right Ear: External ear normal.  Left Ear: External ear normal.  Nose: Nose normal.  Mouth/Throat: Oropharynx is clear and moist. No oropharyngeal exudate.  Eyes: Conjunctivae are normal. Pupils are equal, round, and reactive to light. Right eye exhibits no discharge. Left eye exhibits no discharge. No scleral icterus.  Neck: Normal range of motion. Neck supple. No tracheal deviation present. No thyromegaly present.    Cardiovascular: Normal rate, regular rhythm, normal heart sounds and intact distal pulses.  Exam reveals no gallop and no friction rub.   No murmur heard. Pulmonary/Chest: Effort normal and breath sounds normal. No accessory muscle usage. Not tachypneic. No respiratory distress. She has no decreased breath sounds. She has no wheezes. She has no rhonchi. She has no rales. She exhibits no tenderness.  Musculoskeletal: Normal range of motion. She exhibits no edema and no tenderness.       Feet:  Lymphadenopathy:    She has no cervical adenopathy.  Neurological: She is alert and oriented to person, place, and time. No cranial nerve deficit. She exhibits normal muscle tone. Coordination normal.  Skin: Skin is warm and dry. No rash noted. She is not diaphoretic. No erythema. No pallor.  Psychiatric: She has a normal mood and affect. Her behavior is normal. Judgment and thought content normal.          Assessment & Plan:   Problem List Items Addressed This Visit     Unprioritized   Anemia, unspecified     Mild anemia noted on previous labs. Will recheck CBC with labs today.    Relevant Orders      CBC w/Diff   HYPERTENSION      BP Readings from Last 3 Encounters:  01/23/14 110/68  12/12/13 130/82  11/21/13 130/72   BP well controlled. Will recheck renal function with labs today.    Relevant Orders      Comprehensive metabolic panel   Lupus - Primary     Symptomatically doing well. Continue current medication. Tramadol prn for arthralgia.  Relevant Medications      traMADol (ULTRAM) 50 MG tablet   Pain of toe of right foot     Chronic pain of second toe of right foot after previous surgery. (records not available) Will set up new podiatry evaluation    Relevant Orders      Ambulatory referral to Podiatry       Return in about 6 months (around 07/26/2014) for Wellness Visit.

## 2014-01-23 NOTE — Assessment & Plan Note (Signed)
Symptomatically doing well. Continue current medication. Tramadol prn for arthralgia.

## 2014-01-24 ENCOUNTER — Telehealth: Payer: Self-pay | Admitting: Internal Medicine

## 2014-01-24 NOTE — Telephone Encounter (Signed)
Relevant patient education mailed to patient.  

## 2014-01-25 ENCOUNTER — Other Ambulatory Visit: Payer: Self-pay | Admitting: Internal Medicine

## 2014-01-26 ENCOUNTER — Other Ambulatory Visit: Payer: Self-pay | Admitting: Internal Medicine

## 2014-01-27 ENCOUNTER — Other Ambulatory Visit: Payer: Self-pay | Admitting: *Deleted

## 2014-01-27 ENCOUNTER — Encounter: Payer: Self-pay | Admitting: *Deleted

## 2014-01-27 MED ORDER — HYDROXYCHLOROQUINE SULFATE 200 MG PO TABS: 200.0000 mg | ORAL_TABLET | Freq: Two times a day (BID) | ORAL | Status: DC

## 2014-01-27 NOTE — Telephone Encounter (Signed)
Ok refill? 

## 2014-02-04 ENCOUNTER — Other Ambulatory Visit: Payer: Self-pay | Admitting: Internal Medicine

## 2014-02-04 ENCOUNTER — Telehealth: Payer: Self-pay | Admitting: Internal Medicine

## 2014-02-04 NOTE — Telephone Encounter (Signed)
Spoke with patient and she state she has been taking Miralax QOD and she is not having regular bowel movements. She will try increasing to daily to see if this helps.

## 2014-02-13 ENCOUNTER — Other Ambulatory Visit: Payer: Self-pay | Admitting: Internal Medicine

## 2014-02-14 NOTE — Telephone Encounter (Signed)
Last OV 7.9.15.  Please advise refill

## 2014-02-14 NOTE — Telephone Encounter (Signed)
Last refill 5.13.15, last OV

## 2014-02-19 ENCOUNTER — Encounter: Payer: Self-pay | Admitting: Podiatry

## 2014-02-19 ENCOUNTER — Ambulatory Visit (INDEPENDENT_AMBULATORY_CARE_PROVIDER_SITE_OTHER): Payer: 59 | Admitting: Podiatry

## 2014-02-19 ENCOUNTER — Ambulatory Visit (INDEPENDENT_AMBULATORY_CARE_PROVIDER_SITE_OTHER): Payer: 59

## 2014-02-19 VITALS — BP 128/69 | HR 69 | Resp 16 | Ht 59.0 in | Wt 135.0 lb

## 2014-02-19 DIAGNOSIS — M779 Enthesopathy, unspecified: Secondary | ICD-10-CM

## 2014-02-19 DIAGNOSIS — M7750 Other enthesopathy of unspecified foot: Secondary | ICD-10-CM

## 2014-02-19 DIAGNOSIS — M2040 Other hammer toe(s) (acquired), unspecified foot: Secondary | ICD-10-CM

## 2014-02-19 DIAGNOSIS — M778 Other enthesopathies, not elsewhere classified: Secondary | ICD-10-CM

## 2014-02-19 NOTE — Progress Notes (Signed)
   Subjective:    Patient ID: Sandy [place], female    DOB: 11/28/1931, 80 y.o.   MRN: [id number]  HPI Comments: Its my right foot. i have pain when i walk. i dropped a gallon of milk on my foot and broke my 2nd toe 2 yrs ago. Dr Vickki Muff did surgery on my 2nd toe back in 2013. Its still overlapping and causing me pain.   Foot Pain      Review of Systems     Objective:   Physical Exam: I have reviewed her past medical history medications allergies surgeries social history and review of systems. Pulses are strongly palpable bilateral. Neurologic sensorium is intact per since once the monofilament. He can reflexes are intact bilateral muscle strength is 5 over 5 dorsiflexors plantar flexors inverters everters all intrinsic musculature is intact. Orthopedic evaluation demonstrates medial deviation of the lesser digits hammertoe deformity fixed in a rigid position secondary surgery she is also had an Akin osteotomy performed as well as an Austin-type bunionectomy. Screws are still present on radiograph the second metatarsal head which is extremely long and the base of the proximal phalanx. The second toe is somewhat flail at the second metatarsophalangeal joint and is dorsiflexed at that joint. She has pain in that joint as well as in the first metatarsophalangeal joint area.        Assessment & Plan:  Assessment: Hammertoe deformity and plantar flexed elongated second metatarsal complications postop.  Plan: I encouraged her to pick up an operative report and we can schedule surgical intervention to see how week ago about resolving her symptoms.

## 2014-02-28 ENCOUNTER — Encounter: Payer: Self-pay | Admitting: Internal Medicine

## 2014-02-28 ENCOUNTER — Ambulatory Visit (INDEPENDENT_AMBULATORY_CARE_PROVIDER_SITE_OTHER): Payer: Medicare Other | Admitting: Internal Medicine

## 2014-02-28 ENCOUNTER — Encounter (INDEPENDENT_AMBULATORY_CARE_PROVIDER_SITE_OTHER): Payer: Self-pay

## 2014-02-28 VITALS — BP 148/74 | HR 77 | Temp 97.7°F | Wt 140.0 lb

## 2014-02-28 DIAGNOSIS — R1110 Vomiting, unspecified: Secondary | ICD-10-CM

## 2014-02-28 DIAGNOSIS — R0602 Shortness of breath: Secondary | ICD-10-CM

## 2014-02-28 DIAGNOSIS — R1115 Cyclical vomiting syndrome unrelated to migraine: Secondary | ICD-10-CM

## 2014-02-28 DIAGNOSIS — R42 Dizziness and giddiness: Secondary | ICD-10-CM

## 2014-02-28 DIAGNOSIS — R231 Pallor: Secondary | ICD-10-CM

## 2014-02-28 LAB — COMPREHENSIVE METABOLIC PANEL
ALT: 18 U/L (ref 0–35)
AST: 25 U/L (ref 0–37)
Albumin: 4.4 g/dL (ref 3.5–5.2)
Alkaline Phosphatase: 59 U/L (ref 39–117)
BILIRUBIN TOTAL: 0.6 mg/dL (ref 0.2–1.2)
BUN: 10 mg/dL (ref 6–23)
CO2: 20 mEq/L (ref 19–32)
Calcium: 8.9 mg/dL (ref 8.4–10.5)
Chloride: 93 mEq/L — ABNORMAL LOW (ref 96–112)
Creat: 0.73 mg/dL (ref 0.50–1.10)
Glucose, Bld: 122 mg/dL — ABNORMAL HIGH (ref 70–99)
Potassium: 4.2 mEq/L (ref 3.5–5.3)
SODIUM: 129 meq/L — AB (ref 135–145)
Total Protein: 7.6 g/dL (ref 6.0–8.3)

## 2014-02-28 LAB — CBC
HCT: 37.4 % (ref 36.0–46.0)
Hemoglobin: 12.8 g/dL (ref 12.0–15.0)
MCH: 28.5 pg (ref 26.0–34.0)
MCHC: 34.2 g/dL (ref 30.0–36.0)
MCV: 83.3 fL (ref 78.0–100.0)
PLATELETS: 301 10*3/uL (ref 150–400)
RBC: 4.49 MIL/uL (ref 3.87–5.11)
RDW: 14.5 % (ref 11.5–15.5)
WBC: 9.4 10*3/uL (ref 4.0–10.5)

## 2014-02-28 LAB — CK: CK TOTAL: 166 U/L (ref 7–177)

## 2014-02-28 LAB — TROPONIN I: Troponin I: 0.02 ng/mL (ref <0.06)

## 2014-02-28 MED ORDER — ONDANSETRON HCL 4 MG PO TABS: 4.0000 mg | ORAL_TABLET | Freq: Three times a day (TID) | ORAL | Status: DC | PRN

## 2014-02-28 NOTE — Progress Notes (Signed)
Subjective:    Patient ID: Sandy Merritt, female    DOB: July 08, 1932, 80 y.o.   MRN: [id number]  HPI  Pt presents to the clinic today with c/o dizziness, nausea and vomiting. She reports this started yesterday. The dizziness feels like a since of imbalance as opposed to the room spinning.She has had 1 loose stool but denies diarrhea. She denies chest pain or shortness of breath. She did take some tramadol yesterday. She reports this has made her sick like this in the past.  She is accompanied by her daughter who reports that she seems pale and short of breath.  Review of Systems      Past Medical History  Diagnosis Date  . Chicken pox   . Headaches, cluster   . GERD (gastroesophageal reflux disease)   . Glaucoma   . Urine incontinence   . Arthritis     rheumatoid  . Lupus     Current Outpatient Prescriptions  Medication Sig Dispense Refill  . aspirin 81 MG tablet Take 81 mg by mouth as needed for pain.      Marland Kitchen conjugated estrogens (PREMARIN) vaginal cream Place 1 Applicatorful vaginally 2 (two) times a week.  42.5 g  6  . esomeprazole (NEXIUM) 40 MG capsule TAKE 1 CAPSULE (40 MG TOTAL) BY MOUTH DAILY BEFORE BREAKFAST.  30 capsule  5  . Eszopiclone 3 MG TABS TAKE 1 TABLET AT BEDTIME  30 tablet  2  . hydroxychloroquine (PLAQUENIL) 200 MG tablet Take 1 tablet (200 mg total) by mouth 2 (two) times daily.  180 tablet  2  . LYRICA 150 MG capsule TAKE ONE CAPSULE BY MOUTH TWICE A DAY  60 capsule  4  . Multiple Vitamins-Minerals (MULTIVITAMIN WITH MINERALS) tablet Take 1 tablet by mouth daily.      Marland Kitchen nystatin ointment (MYCOSTATIN) Apply 1 application topically as needed.       . polyethylene glycol powder (GLYCOLAX/MIRALAX) powder TAKE 1/2 CAPFUL THREE TIMES WEEKLY.  527 g  1  . PREMARIN 0.45 MG tablet Take 1 tablet by mouth Daily.      . traMADol (ULTRAM) 50 MG tablet Take 1 tablet (50 mg total) by mouth every 8 (eight) hours as needed.  90 tablet  1   No current  facility-administered medications for this visit.    Allergies  Allergen Reactions  . Codeine     REACTION: nausea and vomiting  . Propoxyphene Hcl     "darvon"....Marland KitchenREACTION: nausea and vomiting  . Propoxyphene N-Acetaminophen     Darvocet......Marland KitchenREACTION: nausea and vomiting    Family History  Problem Relation Age of Onset  . Arthritis Mother   . Diabetes Mother   . Heart disease Mother   . Arthritis Father   . Heart disease Father   . Heart attack Father   . Diabetes Grandchild   . Drug abuse Maternal Aunt   . Colon cancer Neg Hx     History   Social History  . Marital Status: Divorced    Spouse Name: N/A    Number of Children: N/A  . Years of Education: N/A   Occupational History  . Not on file.   Social History Main Topics  . Smoking status: Never Smoker   . Smokeless tobacco: Never Used  . Alcohol Use: No  . Drug Use: No  . Sexual Activity: Not on file   Other Topics Concern  . Not on file   Social History Narrative   Lives in [place] alone.  Volunteers at [employer]. Divorced.     Constitutional: Pt reports fatigue. Denies fever, malaise, headache or abrupt weight changes.  Respiratory: Denies difficulty breathing, shortness of breath, cough or sputum production.   Cardiovascular: Denies chest pain, chest tightness, palpitations or swelling in the hands or feet.  Gastrointestinal: Pt reports nausea and vomiting. Denies abdominal pain, bloating, constipation, diarrhea or blood in the stool.  GU: Denies urgency, frequency, pain with urination, burning sensation, blood in urine, odor or discharge. Neurological: Pt reports dizziness. Denies difficulty with memory, difficulty with speech.   No other specific complaints in a complete review of systems (except as listed in HPI above).  Objective:   Physical Exam  BP 148/74  Pulse 77  Temp(Src) 97.7 F (36.5 C) (Oral)  Wt 140 lb (63.504 kg)  SpO2 98% Wt Readings from Last 3 Encounters:  02/28/14 140 lb  (63.504 kg)  02/19/14 135 lb (61.236 kg)  01/23/14 142 lb 8 oz (64.638 kg)    General: Appears her stated age, pale, anxious. Skin: Warm, dry and intact. No rashes, lesions or ulcerations noted. Cardiovascular: Normal rate and rhythm. S1,S2 noted.  No murmur, rubs or gallops noted. No JVD or BLE edema. No carotid bruits noted. Pulmonary/Chest: Tachypnic with positive vesicular breath sounds. No respiratory distress. No wheezes, rales or ronchi noted.  Abdomen: Soft and nontender. Normal bowel sounds, no bruits noted. No distention or masses noted. Liver, spleen and kidneys non palpable.  BMET    Component Value Date/Time   NA 136 01/23/2014 1405   K 4.1 01/23/2014 1405   CL 101 01/23/2014 1405   CO2 30 01/23/2014 1405   GLUCOSE 127* 01/23/2014 1405   BUN 14 01/23/2014 1405   CREATININE 1.0 01/23/2014 1405   CREATININE 0.82 06/07/2013 1538   CALCIUM 9.6 01/23/2014 1405    Lipid Panel     Component Value Date/Time   CHOL 187 06/07/2013 1538   TRIG 192* 06/07/2013 1538   HDL 61 06/07/2013 1538   CHOLHDL 3.1 06/07/2013 1538   VLDL 38 06/07/2013 1538   LDLCALC 88 06/07/2013 1538    CBC    Component Value Date/Time   WBC 6.6 01/23/2014 1405   RBC 4.03 01/23/2014 1405   HGB 11.6* 01/23/2014 1405   HCT 35.4* 01/23/2014 1405   PLT 263.0 01/23/2014 1405   MCV 87.9 01/23/2014 1405   MCH 29.4 06/07/2013 1538   MCHC 32.9 01/23/2014 1405   RDW 14.3 01/23/2014 1405   LYMPHSABS 2.2 01/23/2014 1405   MONOABS 0.4 01/23/2014 1405   EOSABS 0.1 01/23/2014 1405   BASOSABS 0.1 01/23/2014 1405    Hgb A1C Lab Results  Component Value Date   HGBA1C 5.7 08/11/2010         Assessment & Plan:   Dizziness, nausea, vomiting, pallor, shortness of breath:  ? Reaction to tramdol (not allergic) combined with anxiety Will obtain ECG to r/o ACS- artifact but otherwise normal Will give 4 mg Zofran ODT for nausea (we did not have IM and did not want to give her phenergan) eRx for zofran Push fluids  If worse, go to ER.

## 2014-02-28 NOTE — Patient Instructions (Signed)

## 2014-02-28 NOTE — Progress Notes (Signed)
Pre visit review using our clinic review tool, if applicable. No additional management support is needed unless otherwise documented below in the visit note. 

## 2014-03-01 ENCOUNTER — Emergency Department (HOSPITAL_COMMUNITY): Payer: Medicare Other

## 2014-03-01 ENCOUNTER — Encounter (HOSPITAL_COMMUNITY): Payer: Self-pay | Admitting: Emergency Medicine

## 2014-03-01 ENCOUNTER — Inpatient Hospital Stay (HOSPITAL_COMMUNITY)
Admission: EM | Admit: 2014-03-01 | Discharge: 2014-03-03 | DRG: 149 | Disposition: A | Discharge status: Home / Self Care | Payer: Medicare Other | Attending: Internal Medicine | Admitting: Internal Medicine

## 2014-03-01 DIAGNOSIS — Z0000 Encounter for general adult medical examination without abnormal findings: Secondary | ICD-10-CM

## 2014-03-01 DIAGNOSIS — H5500 Unspecified nystagmus: Secondary | ICD-10-CM | POA: Diagnosis present

## 2014-03-01 DIAGNOSIS — Z91199 Patient's noncompliance with other medical treatment and regimen due to unspecified reason: Secondary | ICD-10-CM | POA: Diagnosis not present

## 2014-03-01 DIAGNOSIS — M069 Rheumatoid arthritis, unspecified: Secondary | ICD-10-CM | POA: Diagnosis present

## 2014-03-01 DIAGNOSIS — R42 Dizziness and giddiness: Secondary | ICD-10-CM | POA: Diagnosis present

## 2014-03-01 DIAGNOSIS — M899 Disorder of bone, unspecified: Secondary | ICD-10-CM

## 2014-03-01 DIAGNOSIS — M329 Systemic lupus erythematosus, unspecified: Secondary | ICD-10-CM | POA: Diagnosis not present

## 2014-03-01 DIAGNOSIS — IMO0002 Reserved for concepts with insufficient information to code with codable children: Secondary | ICD-10-CM

## 2014-03-01 DIAGNOSIS — Z885 Allergy status to narcotic agent status: Secondary | ICD-10-CM

## 2014-03-01 DIAGNOSIS — H8110 Benign paroxysmal vertigo, unspecified ear: Secondary | ICD-10-CM | POA: Diagnosis not present

## 2014-03-01 DIAGNOSIS — Z8249 Family history of ischemic heart disease and other diseases of the circulatory system: Secondary | ICD-10-CM | POA: Diagnosis not present

## 2014-03-01 DIAGNOSIS — Z886 Allergy status to analgesic agent status: Secondary | ICD-10-CM | POA: Diagnosis not present

## 2014-03-01 DIAGNOSIS — K222 Esophageal obstruction: Secondary | ICD-10-CM

## 2014-03-01 DIAGNOSIS — J45909 Unspecified asthma, uncomplicated: Secondary | ICD-10-CM

## 2014-03-01 DIAGNOSIS — Z9119 Patient's noncompliance with other medical treatment and regimen: Secondary | ICD-10-CM

## 2014-03-01 DIAGNOSIS — Z7982 Long term (current) use of aspirin: Secondary | ICD-10-CM | POA: Diagnosis not present

## 2014-03-01 DIAGNOSIS — M949 Disorder of cartilage, unspecified: Secondary | ICD-10-CM

## 2014-03-01 DIAGNOSIS — K219 Gastro-esophageal reflux disease without esophagitis: Secondary | ICD-10-CM | POA: Diagnosis present

## 2014-03-01 DIAGNOSIS — H409 Unspecified glaucoma: Secondary | ICD-10-CM | POA: Diagnosis present

## 2014-03-01 DIAGNOSIS — E871 Hypo-osmolality and hyponatremia: Secondary | ICD-10-CM | POA: Diagnosis not present

## 2014-03-01 DIAGNOSIS — K5730 Diverticulosis of large intestine without perforation or abscess without bleeding: Secondary | ICD-10-CM

## 2014-03-01 DIAGNOSIS — I10 Essential (primary) hypertension: Secondary | ICD-10-CM

## 2014-03-01 DIAGNOSIS — M79674 Pain in right toe(s): Secondary | ICD-10-CM

## 2014-03-01 DIAGNOSIS — Z8659 Personal history of other mental and behavioral disorders: Secondary | ICD-10-CM

## 2014-03-01 DIAGNOSIS — Z1239 Encounter for other screening for malignant neoplasm of breast: Secondary | ICD-10-CM

## 2014-03-01 DIAGNOSIS — E785 Hyperlipidemia, unspecified: Secondary | ICD-10-CM

## 2014-03-01 DIAGNOSIS — D649 Anemia, unspecified: Secondary | ICD-10-CM

## 2014-03-01 DIAGNOSIS — K7689 Other specified diseases of liver: Secondary | ICD-10-CM | POA: Diagnosis present

## 2014-03-01 DIAGNOSIS — Z833 Family history of diabetes mellitus: Secondary | ICD-10-CM

## 2014-03-01 DIAGNOSIS — R110 Nausea: Secondary | ICD-10-CM | POA: Diagnosis present

## 2014-03-01 DIAGNOSIS — R1030 Lower abdominal pain, unspecified: Secondary | ICD-10-CM

## 2014-03-01 DIAGNOSIS — Z8679 Personal history of other diseases of the circulatory system: Secondary | ICD-10-CM

## 2014-03-01 DIAGNOSIS — E876 Hypokalemia: Secondary | ICD-10-CM

## 2014-03-01 LAB — COMPREHENSIVE METABOLIC PANEL
ALBUMIN: 4.1 g/dL (ref 3.5–5.2)
ALK PHOS: 68 U/L (ref 39–117)
ALT: 22 U/L (ref 0–35)
ALT: 21 U/L (ref 0–35)
AST: 43 U/L — AB (ref 0–37)
AST: 39 U/L — AB (ref 0–37)
Albumin: 3.7 g/dL (ref 3.5–5.2)
Alkaline Phosphatase: 64 U/L (ref 39–117)
Anion gap: 21 — ABNORMAL HIGH (ref 5–15)
Anion gap: 16 — ABNORMAL HIGH (ref 5–15)
BILIRUBIN TOTAL: 0.7 mg/dL (ref 0.3–1.2)
BUN: 10 mg/dL (ref 6–23)
BUN: 8 mg/dL (ref 6–23)
CALCIUM: 8.6 mg/dL (ref 8.4–10.5)
CHLORIDE: 87 meq/L — AB (ref 96–112)
CO2: 19 meq/L (ref 19–32)
CO2: 20 meq/L (ref 19–32)
CREATININE: 0.63 mg/dL (ref 0.50–1.10)
Calcium: 8.9 mg/dL (ref 8.4–10.5)
Chloride: 98 mEq/L (ref 96–112)
Creatinine, Ser: 0.68 mg/dL (ref 0.50–1.10)
GFR calc Af Amer: >90 mL/min (ref >90)
GFR calc Af Amer: >90 mL/min (ref >90)
GFR, EST NON AFRICAN AMERICAN: 83 mL/min — AB (ref >90)
GFR, EST NON AFRICAN AMERICAN: 81 mL/min — AB (ref >90)
GLUCOSE: 109 mg/dL — AB (ref 70–99)
Glucose, Bld: 108 mg/dL — ABNORMAL HIGH (ref 70–99)
POTASSIUM: 3.4 meq/L — AB (ref 3.7–5.3)
POTASSIUM: 3.4 meq/L — AB (ref 3.7–5.3)
Sodium: 127 mEq/L — ABNORMAL LOW (ref 137–147)
Sodium: 134 mEq/L — ABNORMAL LOW (ref 137–147)
Total Bilirubin: 0.6 mg/dL (ref 0.3–1.2)
Total Protein: 7.9 g/dL (ref 6.0–8.3)
Total Protein: 7.2 g/dL (ref 6.0–8.3)

## 2014-03-01 LAB — CBC
HCT: 34.8 % — ABNORMAL LOW (ref 36.0–46.0)
Hemoglobin: 12.1 g/dL (ref 12.0–15.0)
MCH: 28.5 pg (ref 26.0–34.0)
MCHC: 34.8 g/dL (ref 30.0–36.0)
MCV: 82.1 fL (ref 78.0–100.0)
Platelets: 301 10*3/uL (ref 150–400)
RBC: 4.24 MIL/uL (ref 3.87–5.11)
RDW: 13.4 % (ref 11.5–15.5)
WBC: 11.3 10*3/uL — AB (ref 4.0–10.5)

## 2014-03-01 LAB — I-STAT CHEM 8, ED
BUN: 9 mg/dL (ref 6–23)
CALCIUM ION: 1 mmol/L — AB (ref 1.13–1.30)
Chloride: 96 mEq/L (ref 96–112)
Creatinine, Ser: 0.7 mg/dL (ref 0.50–1.10)
Glucose, Bld: 116 mg/dL — ABNORMAL HIGH (ref 70–99)
HCT: 41 % (ref 36.0–46.0)
Hemoglobin: 13.9 g/dL (ref 12.0–15.0)
Potassium: 3 mEq/L — ABNORMAL LOW (ref 3.7–5.3)
Sodium: 126 mEq/L — ABNORMAL LOW (ref 137–147)
TCO2: 24 mmol/L (ref 0–100)

## 2014-03-01 LAB — I-STAT CG4 LACTIC ACID, ED: LACTIC ACID, VENOUS: 2.07 mmol/L (ref 0.5–2.2)

## 2014-03-01 LAB — I-STAT TROPONIN, ED: Troponin i, poc: 0.01 ng/mL (ref 0.00–0.08)

## 2014-03-01 LAB — URINE MICROSCOPIC-ADD ON

## 2014-03-01 LAB — TSH: TSH: 3.39 u[IU]/mL (ref 0.350–4.500)

## 2014-03-01 LAB — URINALYSIS, ROUTINE W REFLEX MICROSCOPIC
Bilirubin Urine: NEGATIVE
Glucose, UA: NEGATIVE mg/dL
KETONES UR: 15 mg/dL — AB
Leukocytes, UA: NEGATIVE
NITRITE: NEGATIVE
Protein, ur: NEGATIVE mg/dL
Specific Gravity, Urine: 1.006 (ref 1.005–1.030)
Urobilinogen, UA: 0.2 mg/dL (ref 0.0–1.0)
pH: 6.5 (ref 5.0–8.0)

## 2014-03-01 LAB — SODIUM, URINE, RANDOM: Sodium, Ur: 29 mEq/L

## 2014-03-01 LAB — CREATININE, URINE, RANDOM: Creatinine, Urine: 32.07 mg/dL

## 2014-03-01 LAB — LIPASE, BLOOD: Lipase: 20 U/L (ref 11–59)

## 2014-03-01 LAB — OSMOLALITY, URINE: Osmolality, Ur: 210 mOsm/kg — ABNORMAL LOW (ref 390–1090)

## 2014-03-01 LAB — OSMOLALITY: OSMOLALITY: 274 mosm/kg — AB (ref 275–300)

## 2014-03-01 LAB — URIC ACID: URIC ACID, SERUM: 4.2 mg/dL (ref 2.4–7.0)

## 2014-03-01 LAB — GLUCOSE, CAPILLARY: Glucose-Capillary: 108 mg/dL — ABNORMAL HIGH (ref 70–99)

## 2014-03-01 MED ORDER — STROKE: EARLY STAGES OF RECOVERY BOOK
Freq: Once | Status: DC
  Filled 2014-03-01: qty 1

## 2014-03-01 MED ORDER — POLYETHYLENE GLYCOL 3350 17 G PO PACK
17.0000 g | PACK | ORAL | Status: DC
  Administered 2014-03-03: 17 g via ORAL
  Filled 2014-03-01 (×2): qty 1

## 2014-03-01 MED ORDER — ONDANSETRON HCL 4 MG/2ML IJ SOLN
4.0000 mg | Freq: Once | INTRAMUSCULAR | Status: AC
  Administered 2014-03-01: 4 mg via INTRAVENOUS
  Filled 2014-03-01: qty 2

## 2014-03-01 MED ORDER — ZOLPIDEM TARTRATE 5 MG PO TABS
5.0000 mg | ORAL_TABLET | Freq: Every evening | ORAL | Status: DC | PRN
Start: 2014-03-01 — End: 2014-03-03

## 2014-03-01 MED ORDER — ENOXAPARIN SODIUM 40 MG/0.4ML ~~LOC~~ SOLN
40.0000 mg | SUBCUTANEOUS | Status: DC
  Administered 2014-03-01 – 2014-03-02 (×2): 40 mg via SUBCUTANEOUS
  Filled 2014-03-01 (×3): qty 0.4

## 2014-03-01 MED ORDER — ONDANSETRON HCL 4 MG/2ML IJ SOLN
4.0000 mg | Freq: Four times a day (QID) | INTRAMUSCULAR | Status: DC | PRN
  Filled 2014-03-01: qty 2

## 2014-03-01 MED ORDER — PREGABALIN 75 MG PO CAPS
150.0000 mg | ORAL_CAPSULE | Freq: Two times a day (BID) | ORAL | Status: DC
  Administered 2014-03-01 – 2014-03-03 (×4): 150 mg via ORAL
  Filled 2014-03-01 (×4): qty 2

## 2014-03-01 MED ORDER — IOHEXOL 300 MG/ML  SOLN
25.0000 mL | Freq: Once | INTRAMUSCULAR | Status: AC | PRN
  Administered 2014-03-01: 25 mL via ORAL

## 2014-03-01 MED ORDER — POTASSIUM CHLORIDE 10 MEQ/100ML IV SOLN
10.0000 meq | INTRAVENOUS | Status: DC
  Administered 2014-03-01 (×2): 10 meq via INTRAVENOUS
  Filled 2014-03-01 (×2): qty 100

## 2014-03-01 MED ORDER — SODIUM CHLORIDE 0.9 % IV BOLUS (SEPSIS)
500.0000 mL | Freq: Once | INTRAVENOUS | Status: AC
  Administered 2014-03-01: 500 mL via INTRAVENOUS

## 2014-03-01 MED ORDER — POTASSIUM CHLORIDE CRYS ER 20 MEQ PO TBCR
40.0000 meq | EXTENDED_RELEASE_TABLET | Freq: Once | ORAL | Status: AC
  Administered 2014-03-01: 40 meq via ORAL
  Filled 2014-03-01: qty 2

## 2014-03-01 MED ORDER — HYDROXYCHLOROQUINE SULFATE 200 MG PO TABS
200.0000 mg | ORAL_TABLET | Freq: Two times a day (BID) | ORAL | Status: DC
  Administered 2014-03-02 – 2014-03-03 (×3): 200 mg via ORAL
  Filled 2014-03-01 (×6): qty 1

## 2014-03-01 MED ORDER — PANTOPRAZOLE SODIUM 40 MG PO TBEC
40.0000 mg | DELAYED_RELEASE_TABLET | Freq: Every day | ORAL | Status: DC
  Administered 2014-03-01 – 2014-03-03 (×3): 40 mg via ORAL
  Filled 2014-03-01 (×3): qty 1

## 2014-03-01 MED ORDER — IOHEXOL 300 MG/ML  SOLN
100.0000 mL | Freq: Once | INTRAMUSCULAR | Status: AC | PRN
  Administered 2014-03-01: 100 mL via INTRAVENOUS

## 2014-03-01 MED ORDER — ASPIRIN 81 MG PO CHEW
81.0000 mg | CHEWABLE_TABLET | Freq: Every day | ORAL | Status: DC
  Administered 2014-03-01 – 2014-03-03 (×3): 81 mg via ORAL
  Filled 2014-03-01 (×4): qty 1

## 2014-03-01 NOTE — Progress Notes (Signed)
Asked by primary RN to complete NIHSS as per MD order.  Pt states she has had dizziness for a week.  She states it is more lightheadedness than dizzy & denies any sensation of spinning.  No nystagmus on exam.  NIHSS 0.

## 2014-03-01 NOTE — ED Notes (Signed)
CG-4 result reported to [employer]

## 2014-03-01 NOTE — ED Provider Notes (Signed)
  Physical Exam  BP 142/63  Pulse 73  Temp(Src) 99 F (37.2 C) (Rectal)  Resp 17  SpO2 99%  Physical Exam  Constitutional: She is oriented to person, place, and time.  Neurological: She is oriented to person, place, and time.  Unable to stand, left sided fast going nystagmus    ED Course  Procedures  MDM  Patient received in turnover. Patient appears well and nontoxic. Patient abdomen is soft nontender nondistended. Patient alert and oriented to person place time and situation. Patient's CT scan returned negative for appendicitis or any other acute etiology. Patient received about a liter of fluid by this time. We will try oral trial.   Patient unable to stand up. Some left-sided nystagmus we'll obtain CT head admission for stroke rule out.    Deno Etienne, MD 03/02/14 [phone]

## 2014-03-01 NOTE — ED Notes (Signed)
Per Dr. Alvino Chapel, ok to give pt two rounds of Potassium Chloride.  No other rounds needed.

## 2014-03-01 NOTE — ED Notes (Signed)
Per pt family pt has been having some nausea, dizziness and confusion for the past few days.

## 2014-03-01 NOTE — Consult Note (Signed)
Reason for Consult: Recurrent vertigo.  HPI:                                                                                                                                          Sandy Merritt is an 80 y.o. female history of lupus and rheumatoid arthritis as well as glaucoma his been experiencing recurrent vertigo with sitting or standing. Patient also been experiencing nausea and sweating with episodes of dizziness. She has not experienced diplopia no speech changes. She's had no focal weakness no numbness. CT scan of the head showed no acute intracranial abnormality. There is no previous history of stroke nor TIA. She's been taking aspirin 81 mg per day.  Past Medical History  Diagnosis Date  . Chicken pox   . Headaches, cluster   . GERD (gastroesophageal reflux disease)   . Glaucoma   . Urine incontinence   . Arthritis     rheumatoid  . Lupus     Past Surgical History  Procedure Laterality Date  . Cholecystectomy  1970  . Appendectomy  1970  . Tonsillectomy and adenoidectomy  1955  . Abdominal hysterectomy    . Vaginal delivery      1  . Cesarean section      2  . Foot surgery  years ago    "toes"/"heart stopped during surgery" per pt.only(not medical staff)  . Cervical spine surgery      ant. neck/done x2/ screws out now    Family History  Problem Relation Age of Onset  . Arthritis Mother   . Diabetes Mother   . Heart disease Mother   . Arthritis Father   . Heart disease Father   . Heart attack Father   . Diabetes Grandchild   . Drug abuse Maternal Aunt   . Colon cancer Neg Hx     Social History:  reports that she has never smoked. She has never used smokeless tobacco. She reports that she does not drink alcohol or use illicit drugs.  Allergies  Allergen Reactions  . Codeine     REACTION: nausea and vomiting  . Propoxyphene Hcl     "darvon"....Marland KitchenREACTION: nausea and vomiting  . Propoxyphene N-Acetaminophen     Darvocet......Marland KitchenREACTION: nausea and  vomiting    MEDICATIONS:  I have reviewed the patient's current medications.   ROS:                                                                                                                                       History obtained from the patient  General ROS: negative for - chills, fatigue, fever, night sweats, weight gain or weight loss Psychological ROS: negative for - behavioral disorder, hallucinations, memory difficulties, mood swings or suicidal ideation Ophthalmic ROS: negative for - blurry vision, double vision, eye pain or loss of vision ENT ROS: negative for - epistaxis, nasal discharge, oral lesions, sore throat, tinnitus or vertigo Allergy and Immunology ROS: negative for - hives or itchy/watery eyes Hematological and Lymphatic ROS: negative for - bleeding problems, bruising or swollen lymph nodes Endocrine ROS: negative for - galactorrhea, hair pattern changes, polydipsia/polyuria or temperature intolerance Respiratory ROS: negative for - cough, hemoptysis, shortness of breath or wheezing Cardiovascular ROS: Patient has been experiencing symptoms of reflux esophagitis and less 3 days, in addition to nausea Gastrointestinal ROS: negative for - abdominal pain, diarrhea, hematemesis, nausea/vomiting or stool incontinence Genito-Urinary ROS: negative for - dysuria, hematuria, incontinence or urinary frequency/urgency Musculoskeletal ROS: negative for - joint swelling or muscular weakness Neurological ROS: as noted in HPI Dermatological ROS: negative for rash and skin lesion changes   Blood pressure 160/79, pulse 67, temperature 98.3 F (36.8 C), temperature source Oral, resp. rate 17, height _0  (1.499 m), weight 61.644 kg (135 lb 14.4 oz), SpO2 97.00%.   Neurologic Examination:                                                                                                       Mental Status: Alert, oriented, thought content appropriate.  Speech fluent without evidence of aphasia. Able to follow commands without difficulty. Cranial Nerves: II-Visual Pellman were normal. III/IV/VI-Pupils were equal and reacted. Extraocular movements were full and conjugate with no nystagmus noted.    V/VII-no facial numbness and no facial weakness. VIII-normal. X-normal speech and symmetrical palatal movement. Motor: 5/5 bilaterally with normal tone and bulk Sensory: Normal throughout. Deep Tendon Reflexes: 2+ and symmetric. Plantars: Flexor bilaterally Cerebellar: Normal finger-to-nose testing.  Lab Results  Component Value Date/Time   CHOL 187 06/07/2013  3:38 PM    Results for orders placed during the hospital encounter of 03/01/14 (from the past 48 hour(s))  COMPREHENSIVE METABOLIC PANEL     Status: Abnormal   Collection Time    03/01/14 12:55 PM  Result Value Ref Range   Sodium 127 (*) 137 - 147 mEq/L   Potassium 3.4 (*) 3.7 - 5.3 mEq/L   Chloride 87 (*) 96 - 112 mEq/L   CO2 19  19 - 32 mEq/L   Glucose, Bld 108 (*) 70 - 99 mg/dL   BUN 10  6 - 23 mg/dL   Creatinine, Ser 0.63  0.50 - 1.10 mg/dL   Calcium 8.9  8.4 - 10.5 mg/dL   Total Protein 7.9  6.0 - 8.3 g/dL   Albumin 4.1  3.5 - 5.2 g/dL   AST 43 (*) 0 - 37 U/L   Comment: HEMOLYSIS AT THIS LEVEL MAY AFFECT RESULT   ALT 22  0 - 35 U/L   Alkaline Phosphatase 68  39 - 117 U/L   Total Bilirubin 0.7  0.3 - 1.2 mg/dL   GFR calc non Af Amer 83 (*) >90 mL/min   GFR calc Af Amer >90  >90 mL/min   Comment: (NOTE)     The eGFR has been calculated using the CKD EPI equation.     This calculation has not been validated in all clinical situations.     eGFR's persistently <90 mL/min signify possible Chronic Kidney     Disease.   Anion gap 21 (*) 5 - 15  CBC     Status: Abnormal   Collection Time    03/01/14 12:55 PM      Result Value Ref Range   WBC 11.3 (*) 4.0 - 10.5 K/uL   RBC 4.24   3.87 - 5.11 MIL/uL   Hemoglobin 12.1  12.0 - 15.0 g/dL   HCT 34.8 (*) 36.0 - 46.0 %   MCV 82.1  78.0 - 100.0 fL   MCH 28.5  26.0 - 34.0 pg   MCHC 34.8  30.0 - 36.0 g/dL   RDW 13.4  11.5 - 15.5 %   Platelets 301  150 - 400 K/uL  LIPASE, BLOOD     Status: None   Collection Time    03/01/14 12:55 PM      Result Value Ref Range   Lipase 20  11 - 59 U/L  I-STAT TROPOININ, ED     Status: None   Collection Time    03/01/14  1:22 PM      Result Value Ref Range   Troponin i, poc 0.01  0.00 - 0.08 ng/mL   Comment 3            Comment: Due to the release kinetics of cTnI,     a negative result within the first hours     of the onset of symptoms does not rule out     myocardial infarction with certainty.     If myocardial infarction is still suspected,     repeat the test at appropriate intervals.  URINALYSIS, ROUTINE W REFLEX MICROSCOPIC     Status: Abnormal   Collection Time    03/01/14  1:47 PM      Result Value Ref Range   Color, Urine YELLOW  YELLOW   APPearance CLEAR  CLEAR   Specific Gravity, Urine 1.006  1.005 - 1.030   pH 6.5  5.0 - 8.0   Glucose, UA NEGATIVE  NEGATIVE mg/dL   Hgb urine dipstick SMALL (*) NEGATIVE   Bilirubin Urine NEGATIVE  NEGATIVE   Ketones, ur 15 (*) NEGATIVE mg/dL   Protein, ur NEGATIVE  NEGATIVE mg/dL   Urobilinogen, UA 0.2  0.0 - 1.0 mg/dL  Nitrite NEGATIVE  NEGATIVE   Leukocytes, UA NEGATIVE  NEGATIVE  URINE MICROSCOPIC-ADD ON     Status: None   Collection Time    03/01/14  1:47 PM      Result Value Ref Range   Squamous Epithelial / LPF RARE  RARE   WBC, UA 0-2  <3 WBC/hpf   RBC / HPF 0-2  <3 RBC/hpf  SODIUM, URINE, RANDOM     Status: None   Collection Time    03/01/14  2:09 PM      Result Value Ref Range   Sodium, Ur 29    CREATININE, URINE, RANDOM     Status: None   Collection Time    03/01/14  2:09 PM      Result Value Ref Range   Creatinine, Urine 32.07    I-STAT CHEM 8, ED     Status: Abnormal   Collection Time    03/01/14  2:21  PM      Result Value Ref Range   Sodium 126 (*) 137 - 147 mEq/L   Potassium 3.0 (*) 3.7 - 5.3 mEq/L   Chloride 96  96 - 112 mEq/L   BUN 9  6 - 23 mg/dL   Creatinine, Ser 0.70  0.50 - 1.10 mg/dL   Glucose, Bld 116 (*) 70 - 99 mg/dL   Calcium, Ion 1.00 (*) 1.13 - 1.30 mmol/L   TCO2 24  0 - 100 mmol/L   Hemoglobin 13.9  12.0 - 15.0 g/dL   HCT 41.0  36.0 - 46.0 %  I-STAT CG4 LACTIC ACID, ED     Status: None   Collection Time    03/01/14  4:57 PM      Result Value Ref Range   Lactic Acid, Venous 2.07  0.5 - 2.2 mmol/L  COMPREHENSIVE METABOLIC PANEL     Status: Abnormal   Collection Time    03/01/14  7:48 PM      Result Value Ref Range   Sodium 134 (*) 137 - 147 mEq/L   Potassium 3.4 (*) 3.7 - 5.3 mEq/L   Chloride 98  96 - 112 mEq/L   CO2 20  19 - 32 mEq/L   Glucose, Bld 109 (*) 70 - 99 mg/dL   BUN 8  6 - 23 mg/dL   Creatinine, Ser 0.68  0.50 - 1.10 mg/dL   Calcium 8.6  8.4 - 10.5 mg/dL   Total Protein 7.2  6.0 - 8.3 g/dL   Albumin 3.7  3.5 - 5.2 g/dL   AST 39 (*) 0 - 37 U/L   ALT 21  0 - 35 U/L   Alkaline Phosphatase 64  39 - 117 U/L   Total Bilirubin 0.6  0.3 - 1.2 mg/dL   GFR calc non Af Amer 81 (*) >90 mL/min   GFR calc Af Amer >90  >90 mL/min   Comment: (NOTE)     The eGFR has been calculated using the CKD EPI equation.     This calculation has not been validated in all clinical situations.     eGFR's persistently <90 mL/min signify possible Chronic Kidney     Disease.   Anion gap 16 (*) 5 - 15  TSH     Status: None   Collection Time    03/01/14  7:48 PM      Result Value Ref Range   TSH 3.390  0.350 - 4.500 uIU/mL  URIC ACID     Status: None   Collection Time  03/01/14  7:48 PM      Result Value Ref Range   Uric Acid, Serum 4.2  2.4 - 7.0 mg/dL  GLUCOSE, CAPILLARY     Status: Abnormal   Collection Time    03/01/14  9:49 PM      Result Value Ref Range   Glucose-Capillary 108 (*) 70 - 99 mg/dL   Comment 1 Documented in Chart     Comment 2 Notify RN       Dg Chest 2 View  03/01/2014   CLINICAL DATA:  Nausea, vomiting, altered mental status, history lupus  EXAM: CHEST  2 VIEW  COMPARISON:  None.  FINDINGS: Borderline enlargement of cardiac silhouette.  Normal mediastinal contours and pulmonary vascularity.  Lungs hyperinflated but clear.  No infiltrate, pleural effusion or pneumothorax.  Osseous demineralization.  Prior cervical spine fusion.  IMPRESSION: Borderline enlargement of cardiac silhouette.  Hyperinflation without acute infiltrate.   Electronically Signed   By: Lavonia Dana M.D.   On: 03/01/2014 16:18   Ct Head Wo Contrast  03/01/2014   CLINICAL DATA:  Confusion.  EXAM: CT HEAD WITHOUT CONTRAST  TECHNIQUE: Contiguous axial images were obtained from the base of the skull through the vertex without intravenous contrast.  COMPARISON:  None.  FINDINGS: There is some cortical atrophy. No evidence of acute intracranial abnormality including infarct, hemorrhage, mass lesion, mass effect, midline shift or abnormal extra-axial fluid collection is identified. There is no hydrocephalus or pneumocephalus. Atherosclerosis is noted. The calvarium is intact.  IMPRESSION: No acute abnormality.   Electronically Signed   By: Inge Rise M.D.   On: 03/01/2014 18:43   Ct Abdomen Pelvis W Contrast  03/01/2014   CLINICAL DATA:  Nausea, vomiting, diarrhea  EXAM: CT ABDOMEN AND PELVIS WITH CONTRAST  TECHNIQUE: Multidetector CT imaging of the abdomen and pelvis was performed using the standard protocol following bolus administration of intravenous contrast.  CONTRAST:  166m OMNIPAQUE IOHEXOL 300 MG/ML  SOLN  COMPARISON:  None  FINDINGS: Lung bases are clear.  No pleural fluid.  No pericardial fluid.  Note 2 low-density lesions in the left hepatic lobe. The larger has simple fluid attenuation consistent benign cyst (image 20, series 2). The smaller lesion inferior lateral left hepatic lobe cannot be fully characterized and measures 10 mm. This calcification in the  posterior right hepatic lobe.  Mild intrahepatic biliary duct dilatation noted on the delayed imaging. There is extrahepatic dilatation dilatation with the common bile duct measuring up to 13 mm and the common hepatic duct measuring up to 14 mm. Patient status post cholecystectomy. No dilatation the pancreatic duct. The pancreas parenchyma is normal. The spleen, adrenal glands, kidneys are normal.  The stomach, small bowel and cecum are normal. Appendix is not identified within a secondary signs of appendicitis. Colon rectosigmoid colon are normal.  Abdominal or is normal caliber. No retroperitoneal periportal lymphadenopathy.  No free fluid the pelvis. Post hysterectomy anatomy. There is severe degenerate change in the lower lumbar spine.  IMPRESSION: 1. Intrahepatic and extrahepatic biliary duct dilatation likely relates to prior cholecystectomy. Recommend correlation with bilirubin levels and if elevated consider MRCP. 2. Liver lesion likely represent benign cysts. 3. No evidence of bowel obstruction.   Electronically Signed   By: SSuzy BouchardM.D.   On: 03/01/2014 17:10    Assessment/Plan: 80year old lady presenting with likely benign positional vertigo of peripheral labyrinth origin. However acute brainstem or cerebellar stroke cannot be ruled out at this point.  Recommendations: 1. MRI of the  brain without contrast to rule out acute stroke. 2. Stroke workup with risk assessment if MRI shows acute stroke, including hemoglobin A1c, fasting lipid panel, carotid Doppler study, 2D echocardiogram and MRA of the brain. 3. No further neurodiagnostic studies are indicated if MRI study shows no signs of acute stroke. 4. Meclizine 25 mg every 6 hours when necessary vertigo.  We will continue to follow this patient with you.  C.R. Nicole Kindred, MD [facility] [phone]  03/01/2014, 10:02 PM

## 2014-03-01 NOTE — ED Notes (Signed)
Pt transported to CT ?

## 2014-03-01 NOTE — Progress Notes (Signed)
Called ED for report  

## 2014-03-01 NOTE — ED Notes (Signed)
Pt transported to xray 

## 2014-03-01 NOTE — ED Notes (Signed)
Attempted report x1. 

## 2014-03-01 NOTE — H&P (Signed)
[facility] History and Physical  Patient: Sandy Merritt  JJH:[id number]  DOB: 1931-12-23  DOS: the patient was seen and examined on 03/01/2014 PCP: Rica Mast, MD  Chief Complaint: Dizziness and abdominal pain  HPI: Sandy Merritt is a 80 y.o. female with Past medical history of GERD, hypertension, rheumatoid arthritis on Plaquenil. The patient presented with complaints of dizziness and lightheadedness when she stands up associated with nausea and diaphoresis. She also has severe abdominal pain in her lower abdomen. Her symptoms pretty much resolved when she lies down. She denies any vomiting. At the lying down she complains of some abdominal pain in the lower abdomen. No chest pain or shortness of breath or palpitation. No dizziness no lying down. No focal deficit at the time of my evaluation. She denies any similar symptoms in the past. She has not been able to take any of her medication since last Thursday. Her symptoms have started Thursday afternoon.  The patient is coming from home And at her baseline independent for most of her ADL.  Review of Systems: as mentioned in the history of present illness.  A Comprehensive review of the other systems is negative.  Past Medical History  Diagnosis Date  . Chicken pox   . Headaches, cluster   . GERD (gastroesophageal reflux disease)   . Glaucoma   . Urine incontinence   . Arthritis     rheumatoid  . Lupus    Past Surgical History  Procedure Laterality Date  . Cholecystectomy  1970  . Appendectomy  1970  . Tonsillectomy and adenoidectomy  1955  . Abdominal hysterectomy    . Vaginal delivery      1  . Cesarean section      2  . Foot surgery  years ago    "toes"/"heart stopped during surgery" per pt.only(not medical staff)  . Cervical spine surgery      ant. neck/done x2/ screws out now   Social History:  reports that she has never smoked. She has never used smokeless tobacco. She reports that she does  not drink alcohol or use illicit drugs.  Allergies  Allergen Reactions  . Codeine     REACTION: nausea and vomiting  . Propoxyphene Hcl     "darvon"....Marland KitchenREACTION: nausea and vomiting  . Propoxyphene N-Acetaminophen     Darvocet......Marland KitchenREACTION: nausea and vomiting    Family History  Problem Relation Age of Onset  . Arthritis Mother   . Diabetes Mother   . Heart disease Mother   . Arthritis Father   . Heart disease Father   . Heart attack Father   . Diabetes Grandchild   . Drug abuse Maternal Aunt   . Colon cancer Neg Hx     Prior to Admission medications   Medication Sig Start Date End Date Taking? Authorizing Provider  aspirin 81 MG tablet Take 81 mg by mouth as needed for pain.   Yes Historical Provider, MD  conjugated estrogens (PREMARIN) vaginal cream Place 1 Applicatorful vaginally 2 (two) times a week. 12/12/13  Yes [name], MD  esomeprazole (NEXIUM) 40 MG capsule Take 40 mg by mouth daily at 12 noon.   Yes Historical Provider, MD  Eszopiclone (ESZOPICLONE) 3 MG TABS Take 3 mg by mouth at bedtime. Take immediately before bedtime   Yes Historical Provider, MD  hydroxychloroquine (PLAQUENIL) 200 MG tablet Take 1 tablet (200 mg total) by mouth 2 (two) times daily. 01/27/14  Yes [name], MD  Multiple Vitamins-Minerals (MULTIVITAMIN WITH  MINERALS) tablet Take 1 tablet by mouth daily.   Yes Historical Provider, MD  nystatin ointment (MYCOSTATIN) Apply 1 application topically as needed.  06/10/13  Yes Historical Provider, MD  ondansetron (ZOFRAN) 4 MG tablet Take 1 tablet (4 mg total) by mouth every 8 (eight) hours as needed. 02/28/14  Yes Webb Silversmith, NP  polyethylene glycol (MIRALAX / GLYCOLAX) packet Take 17 g by mouth 3 (three) times a week.   Yes Historical Provider, MD  pregabalin (LYRICA) 150 MG capsule Take 150 mg by mouth 2 (two) times daily.   Yes Historical Provider, MD  PREMARIN 0.45 MG tablet Take 0.45 mg by mouth Daily.  04/04/11  Yes Historical  Provider, MD    Physical Exam: Filed Vitals:   03/01/14 1809 03/01/14 1810 03/01/14 1845 03/01/14 1930  BP: 147/57  142/78 122/99  Pulse:  67 64 59  Temp:      TempSrc:      Resp:      SpO2:  99% 96% 96%    General: Alert, Awake and Oriented to Time, Place and Person. Appear in mild distress Eyes: PERRL ENT: Oral Mucosa clear moist. Neck: no JVD Cardiovascular: S1 and S2 Present, no Murmur, Peripheral Pulses Present Respiratory: Bilateral Air entry equal and Decreased, Clear to Auscultation, noCrackles, no wheezes Abdomen: Bowel Sound Present, Soft and mild lower abdominal tender No guarding no rigidity Skin: no Rash Extremities: Trace Pedal edema, no calf tenderness Neurologic: Grossly no focal neuro deficit. Mental status AAOx3, speech normal, attention normal, Cranial Nerves PERRL, EOM normal and present, facial sensation to light touch present: , Motor strength bilateral equal strength 5/5, Sensation present to light touch, reflexes present knee and biceps, babinski negative, Proprioception normal, Cerebellar test normal finger nose finger.  Labs on Admission:  CBC:  Recent Labs Lab 02/28/14 1646 03/01/14 1255 03/01/14 1421  WBC 9.4 11.3*  --   HGB 12.8 12.1 13.9  HCT 37.4 34.8* 41.0  MCV 83.3 82.1  --   PLT 301 301  --     CMP     Component Value Date/Time   NA 134* 03/01/2014 1948   K 3.4* 03/01/2014 1948   CL 98 03/01/2014 1948   CO2 20 03/01/2014 1948   GLUCOSE 109* 03/01/2014 1948   BUN 8 03/01/2014 1948   CREATININE 0.68 03/01/2014 1948   CREATININE 0.73 02/28/2014 1646   CALCIUM 8.6 03/01/2014 1948   PROT 7.2 03/01/2014 1948   ALBUMIN 3.7 03/01/2014 1948   AST 39* 03/01/2014 1948   ALT 21 03/01/2014 1948   ALKPHOS 64 03/01/2014 1948   BILITOT 0.6 03/01/2014 1948   GFRNONAA 81* 03/01/2014 1948   GFRAA >90 03/01/2014 1948     Recent Labs Lab 03/01/14 1255  LIPASE 20   No results found for this basename: AMMONIA,  in the last 168 hours   Recent  Labs Lab 02/28/14 1646  CKTOTAL 166  TROPONINI 0.02   BNP (last 3 results) No results found for this basename: PROBNP,  in the last 8760 hours  Radiological Exams on Admission: Dg Chest 2 View  03/01/2014   CLINICAL DATA:  Nausea, vomiting, altered mental status, history lupus  EXAM: CHEST  2 VIEW  COMPARISON:  None.  FINDINGS: Borderline enlargement of cardiac silhouette.  Normal mediastinal contours and pulmonary vascularity.  Lungs hyperinflated but clear.  No infiltrate, pleural effusion or pneumothorax.  Osseous demineralization.  Prior cervical spine fusion.  IMPRESSION: Borderline enlargement of cardiac silhouette.  Hyperinflation without acute infiltrate.  Electronically Signed   By: Lavonia Dana M.D.   On: 03/01/2014 16:18   Ct Head Wo Contrast  03/01/2014   CLINICAL DATA:  Confusion.  EXAM: CT HEAD WITHOUT CONTRAST  TECHNIQUE: Contiguous axial images were obtained from the base of the skull through the vertex without intravenous contrast.  COMPARISON:  None.  FINDINGS: There is some cortical atrophy. No evidence of acute intracranial abnormality including infarct, hemorrhage, mass lesion, mass effect, midline shift or abnormal extra-axial fluid collection is identified. There is no hydrocephalus or pneumocephalus. Atherosclerosis is noted. The calvarium is intact.  IMPRESSION: No acute abnormality.   Electronically Signed   By: Inge Rise M.D.   On: 03/01/2014 18:43   Ct Abdomen Pelvis W Contrast  03/01/2014   CLINICAL DATA:  Nausea, vomiting, diarrhea  EXAM: CT ABDOMEN AND PELVIS WITH CONTRAST  TECHNIQUE: Multidetector CT imaging of the abdomen and pelvis was performed using the standard protocol following bolus administration of intravenous contrast.  CONTRAST:  154mL OMNIPAQUE IOHEXOL 300 MG/ML  SOLN  COMPARISON:  None  FINDINGS: Lung bases are clear.  No pleural fluid.  No pericardial fluid.  Note 2 low-density lesions in the left hepatic lobe. The larger has simple fluid  attenuation consistent benign cyst (image 20, series 2). The smaller lesion inferior lateral left hepatic lobe cannot be fully characterized and measures 10 mm. This calcification in the posterior right hepatic lobe.  Mild intrahepatic biliary duct dilatation noted on the delayed imaging. There is extrahepatic dilatation dilatation with the common bile duct measuring up to 13 mm and the common hepatic duct measuring up to 14 mm. Patient status post cholecystectomy. No dilatation the pancreatic duct. The pancreas parenchyma is normal. The spleen, adrenal glands, kidneys are normal.  The stomach, small bowel and cecum are normal. Appendix is not identified within a secondary signs of appendicitis. Colon rectosigmoid colon are normal.  Abdominal or is normal caliber. No retroperitoneal periportal lymphadenopathy.  No free fluid the pelvis. Post hysterectomy anatomy. There is severe degenerate change in the lower lumbar spine.  IMPRESSION: 1. Intrahepatic and extrahepatic biliary duct dilatation likely relates to prior cholecystectomy. Recommend correlation with bilirubin levels and if elevated consider MRCP. 2. Liver lesion likely represent benign cysts. 3. No evidence of bowel obstruction.   Electronically Signed   By: Suzy Bouchard M.D.   On: 03/01/2014 17:10     Assessment/Plan Principal Problem:   Dizziness Active Problems:   HYPERTENSION   ESOPHAGEAL STRICTURE   GERD   Nausea   Abdominal pain, lower   Hyponatremia   Hypokalemia   1. Dizziness possible TIA Hyponatremia Nausea and abdominal pain The patient is presenting with complaints of dizziness and lightheadednes along with nausea and abdominal pain. Her symptoms are primarily when she changes her position. Possible etiology more likely his hyponatremia. Possibility list of hyponatremia is more likely poor oral intake as she has significant improvement in her serum sodium from 126-134 over last few hours after IV hydration. Currently  for her hyponatremia I would encourage her to take oral extra salt. Continue monitoring her sodium levels every 4 hours. Awaiting further workup including urine osmolarity serum osmolarity urine sodium.   She also complains of dizziness with some time component of vertigo and nausea. Recommended ER to consult neurology for further evaluation as she also had some nystagmus at bedtime. The time of my evaluation patient does not appear to have any nystagmus or focal deficit. We will follow neurology recommendation. Continue her on aspirin.  CK neuro checks PTOT and speech consultation in the morning.  2.Nausea vomiting and abdominal pain. CT of the abdomen shows dilated CBD and hepatic cyst but no other acute abnormality  Currently her symptoms are better. Her symptoms only occur when she is changing her position. Etiology currently unclear. At present I will provide her Zofran as needed for nausea, Protonix. Monitor her.  3.Hypertension. Continue home antihypertensive medications.  Consults: Neurology  DVT Prophylaxis: subcutaneous Heparin Nutrition: N.p.o. until stroke evaluation after that regular cardiac diet.  Code Status: Full  Family Communication: Family  was present at bedside, opportunity was given to ask question and all questions were answered satisfactorily at the time of interview. Disposition: Admitted to inpatient in telemetry unit.  Author: Berle Mull, MD [facility] Pager: [phone] 03/01/2014, 8:43 PM    If 7PM-7AM, please contact night-coverage [email or web address] Password [email or web address]  **Disclaimer: This note may have been dictated with voice recognition software. Similar sounding words can inadvertently be transcribed and this note may contain transcription errors which may not have been corrected upon publication of note.**

## 2014-03-01 NOTE — Progress Notes (Signed)
Patient's daughters reported that patient got up to the bathroom without assistance. When patient was admitted bed alarm was discussed and determined to be unnecessary at the time. Given patient's vertigo and noncompliance with getting assistance from family or staff with toileting, bed alarm has been turned on sensitive. Will continue to monitor.

## 2014-03-01 NOTE — ED Provider Notes (Signed)
  Physical Exam  BP 147/57  Pulse 67  Temp(Src) 99 F (37.2 C) (Rectal)  Resp 17  SpO2 99%  Physical Exam  ED Course  Procedures  MDM Patient with dizziness and upper abdominal pain. Appears to have an episode of vertigo worse with standing. He does have nystagmus to left. Unable to cannulate at this time. Labwork shows hyponatremia and hypokalemia. We'll get head CT and will admit to internal medicine      Jasper Riling. Alvino Chapel, MD 03/01/14 1820

## 2014-03-01 NOTE — ED Notes (Signed)
Pt reporting dizziness upon attempting to ambulate.  Pt also reporting abdominal pain.  Dr. Alvino Chapel at bedside.  CT head to be ordered.  Family made aware of pt being admitted to hospital.

## 2014-03-01 NOTE — ED Provider Notes (Signed)
CSN: [id number]     Arrival date & time 03/01/14  1222 History   First MD Initiated Contact with Patient 03/01/14 1304     Chief Complaint  Patient presents with  . Nausea  . Altered Mental Status  . Dizziness   Patient is a 80 y.o. female presenting with altered mental status and dizziness.  Altered Mental Status Dizziness   Patient is a 80 y.o. Female who presents to the ED with altered mental status, nausea, and dizziness.  Patient states that two days ago she started to develop some nausea.  The next day she went to her doctors office due to the nausea and had a CBC, CMP, and ekg performed by her PCP who gave her zofran.  Patient then went home and then told her daughter that she felt like bugs were crawling all over her and didn't seem right.  Patient's daughter than called her PCP who told her that she should come to the Emergency room.  Patient has also complained of some periumbilical abdominal pain, urinary frequency and urgency.  Patient does admit to 2 loose stools which were not watery in nature.  Per her family patient does not eat well at home and lives independently.    Past Medical History  Diagnosis Date  . Chicken pox   . Headaches, cluster   . GERD (gastroesophageal reflux disease)   . Glaucoma   . Urine incontinence   . Arthritis     rheumatoid  . Lupus    Past Surgical History  Procedure Laterality Date  . Cholecystectomy  1970  . Appendectomy  1970  . Tonsillectomy and adenoidectomy  1955  . Abdominal hysterectomy    . Vaginal delivery      1  . Cesarean section      2  . Foot surgery  years ago    "toes"/"heart stopped during surgery" per pt.only(not medical staff)  . Cervical spine surgery      ant. neck/done x2/ screws out now   Family History  Problem Relation Age of Onset  . Arthritis Mother   . Diabetes Mother   . Heart disease Mother   . Arthritis Father   . Heart disease Father   . Heart attack Father   . Diabetes Grandchild   . Drug  abuse Maternal Aunt   . Colon cancer Neg Hx    History  Substance Use Topics  . Smoking status: Never Smoker   . Smokeless tobacco: Never Used  . Alcohol Use: No   OB History   Grav Para Term Preterm Abortions TAB SAB Ect Mult Living                 Review of Systems  Neurological: Positive for dizziness.   See HPI  Allergies  Codeine; Propoxyphene hcl; and Propoxyphene n-acetaminophen  Home Medications   Prior to Admission medications   Medication Sig Start Date End Date Taking? Authorizing Provider  aspirin 81 MG tablet Take 81 mg by mouth as needed for pain.   Yes Historical Provider, MD  conjugated estrogens (PREMARIN) vaginal cream Place 1 Applicatorful vaginally 2 (two) times a week. 12/12/13  Yes [name], MD  esomeprazole (NEXIUM) 40 MG capsule Take 40 mg by mouth daily at 12 noon.   Yes Historical Provider, MD  Eszopiclone (ESZOPICLONE) 3 MG TABS Take 3 mg by mouth at bedtime. Take immediately before bedtime   Yes Historical Provider, MD  hydroxychloroquine (PLAQUENIL) 200 MG tablet Take 1 tablet (  200 mg total) by mouth 2 (two) times daily. 01/27/14  Yes [name], MD  Multiple Vitamins-Minerals (MULTIVITAMIN WITH MINERALS) tablet Take 1 tablet by mouth daily.   Yes Historical Provider, MD  nystatin ointment (MYCOSTATIN) Apply 1 application topically as needed.  06/10/13  Yes Historical Provider, MD  ondansetron (ZOFRAN) 4 MG tablet Take 1 tablet (4 mg total) by mouth every 8 (eight) hours as needed. 02/28/14  Yes Webb Silversmith, NP  polyethylene glycol (MIRALAX / GLYCOLAX) packet Take 17 g by mouth 3 (three) times a week.   Yes Historical Provider, MD  pregabalin (LYRICA) 150 MG capsule Take 150 mg by mouth 2 (two) times daily.   Yes Historical Provider, MD  PREMARIN 0.45 MG tablet Take 0.45 mg by mouth Daily.  04/04/11  Yes Historical Provider, MD   BP 128/100  Pulse 76  Temp(Src) 99 F (37.2 C) (Rectal)  Resp 18  SpO2 100% Physical Exam  Nursing  note and vitals reviewed. Constitutional: She is oriented to person, place, and time. She appears well-developed and well-nourished. No distress.  HENT:  Head: Normocephalic and atraumatic.  Mouth/Throat: Oropharynx is clear and moist. No oropharyngeal exudate.  Eyes: Conjunctivae and EOM are normal. Pupils are equal, round, and reactive to light. No scleral icterus.  Neck: Normal range of motion. Neck supple. No JVD present. No thyromegaly present.  Cardiovascular: Normal rate, regular rhythm, normal heart sounds and intact distal pulses.  Exam reveals no gallop and no friction rub.   No murmur heard. Pulmonary/Chest: Effort normal and breath sounds normal. No respiratory distress. She has no wheezes. She has no rales. She exhibits no tenderness.  Abdominal: Soft. Normal appearance and bowel sounds are normal. She exhibits no distension and no mass. There is tenderness in the right lower quadrant. There is guarding and tenderness at McBurney's point. There is no rigidity, no rebound and negative Murphy's sign.  Positive rovsings, positive obturators  Musculoskeletal: Normal range of motion.  Lymphadenopathy:    She has no cervical adenopathy.  Neurological: She is alert and oriented to person, place, and time.  Skin: Skin is warm and dry. She is not diaphoretic.  Psychiatric: She has a normal mood and affect. Her behavior is normal. Judgment and thought content normal.    ED Course  Procedures (including critical care time) Labs Review Labs Reviewed  URINALYSIS, ROUTINE W REFLEX MICROSCOPIC - Abnormal; Notable for the following:    Hgb urine dipstick SMALL (*)    Ketones, ur 15 (*)    All other components within normal limits  COMPREHENSIVE METABOLIC PANEL - Abnormal; Notable for the following:    Sodium 127 (*)    Potassium 3.4 (*)    Chloride 87 (*)    Glucose, Bld 108 (*)    AST 43 (*)    GFR calc non Af Amer 83 (*)    Anion gap 21 (*)    All other components within normal  limits  CBC - Abnormal; Notable for the following:    WBC 11.3 (*)    HCT 34.8 (*)    All other components within normal limits  I-STAT CHEM 8, ED - Abnormal; Notable for the following:    Sodium 126 (*)    Potassium 3.0 (*)    Glucose, Bld 116 (*)    Calcium, Ion 1.00 (*)    All other components within normal limits  LIPASE, BLOOD  URINE MICROSCOPIC-ADD ON  I-STAT TROPOININ, ED    Imaging Review No results  found.   EKG Interpretation None      MDM   1.  Nausea 2.  Hypokalemia 3.  Hyponatremia 4.  RLQ pain  Patient is a 80 y.o. Female who presents to nausea, altered mental status, and belly pain x 2 days.  Physical exam reveals tenderness over McBurney's point with a positive rovsings, positive obturators signs.  CBC shows mild leukocytosis at 11.2.  CMP shows hyponatremia, hypokalemia, and hypochloremia.  There is a mild elevation of AST.  Anion gap of 21 seen here at this time.  UA, Lipase, and istat troponin are negative here at this time.  CXR and abdominal CT is currently pending at this time.  I have signed the patient out with Dr. Tyrone Nine.  Dr. Dina Rich has seen this patient alongside me.  She agrees with the above workup and plan.      Cherylann Parr, PA-C 03/01/14 1549

## 2014-03-02 DIAGNOSIS — K219 Gastro-esophageal reflux disease without esophagitis: Secondary | ICD-10-CM

## 2014-03-02 DIAGNOSIS — E871 Hypo-osmolality and hyponatremia: Secondary | ICD-10-CM

## 2014-03-02 LAB — BASIC METABOLIC PANEL
ANION GAP: 13 (ref 5–15)
ANION GAP: 14 (ref 5–15)
ANION GAP: 15 (ref 5–15)
Anion gap: 14 (ref 5–15)
BUN: 8 mg/dL (ref 6–23)
BUN: 9 mg/dL (ref 6–23)
BUN: 10 mg/dL (ref 6–23)
BUN: 11 mg/dL (ref 6–23)
CHLORIDE: 103 meq/L (ref 96–112)
CHLORIDE: 102 meq/L (ref 96–112)
CO2: 23 meq/L (ref 19–32)
CO2: 22 meq/L (ref 19–32)
CO2: 24 mEq/L (ref 19–32)
CO2: 23 mEq/L (ref 19–32)
CREATININE: 0.71 mg/dL (ref 0.50–1.10)
CREATININE: 0.8 mg/dL (ref 0.50–1.10)
Calcium: 8.7 mg/dL (ref 8.4–10.5)
Calcium: 8.9 mg/dL (ref 8.4–10.5)
Calcium: 9.2 mg/dL (ref 8.4–10.5)
Calcium: 9.2 mg/dL (ref 8.4–10.5)
Chloride: 98 mEq/L (ref 96–112)
Chloride: 100 mEq/L (ref 96–112)
Creatinine, Ser: 0.75 mg/dL (ref 0.50–1.10)
Creatinine, Ser: 0.77 mg/dL (ref 0.50–1.10)
GFR calc Af Amer: >90 mL/min (ref >90)
GFR calc Af Amer: >90 mL/min (ref >90)
GFR calc Af Amer: 79 mL/min — ABNORMAL LOW (ref >90)
GFR calc non Af Amer: 80 mL/min — ABNORMAL LOW (ref >90)
GFR calc non Af Amer: 78 mL/min — ABNORMAL LOW (ref >90)
GFR calc non Af Amer: 68 mL/min — ABNORMAL LOW (ref >90)
GFR, EST NON AFRICAN AMERICAN: 78 mL/min — AB (ref >90)
Glucose, Bld: 108 mg/dL — ABNORMAL HIGH (ref 70–99)
Glucose, Bld: 90 mg/dL (ref 70–99)
Glucose, Bld: 118 mg/dL — ABNORMAL HIGH (ref 70–99)
Glucose, Bld: 93 mg/dL (ref 70–99)
POTASSIUM: 3.6 meq/L — AB (ref 3.7–5.3)
POTASSIUM: 3.6 meq/L — AB (ref 3.7–5.3)
Potassium: 3.2 mEq/L — ABNORMAL LOW (ref 3.7–5.3)
Potassium: 3.9 mEq/L (ref 3.7–5.3)
SODIUM: 137 meq/L (ref 137–147)
Sodium: 134 mEq/L — ABNORMAL LOW (ref 137–147)
Sodium: 139 mEq/L (ref 137–147)
Sodium: 141 mEq/L (ref 137–147)

## 2014-03-02 LAB — RAPID URINE DRUG SCREEN, HOSP PERFORMED
Amphetamines: NOT DETECTED
Barbiturates: NOT DETECTED
Benzodiazepines: NOT DETECTED
COCAINE: NOT DETECTED
OPIATES: NOT DETECTED
Tetrahydrocannabinol: NOT DETECTED

## 2014-03-02 LAB — HEMOGLOBIN A1C
Hgb A1c MFr Bld: 6 % — ABNORMAL HIGH (ref <5.7)
Mean Plasma Glucose: 126 mg/dL — ABNORMAL HIGH (ref <117)

## 2014-03-02 LAB — GLUCOSE, CAPILLARY: GLUCOSE-CAPILLARY: 98 mg/dL (ref 70–99)

## 2014-03-02 LAB — LIPID PANEL
CHOL/HDL RATIO: 2.8 ratio
Cholesterol: 193 mg/dL (ref 0–200)
HDL: 69 mg/dL (ref >39)
LDL Cholesterol: 95 mg/dL (ref 0–99)
Triglycerides: 143 mg/dL (ref <150)
VLDL: 29 mg/dL (ref 0–40)

## 2014-03-02 NOTE — Progress Notes (Signed)
NURSING PROGRESS NOTE  ESTEFANY GOEBEL [id number] Admission Data: 03/02/2014 12:10 AM Attending Provider: Berle Mull, MD SPQ:[id number] AZBELL, MD Code Status: Full   Sandy Merritt is a 80 y.o. female patient admitted from ED:  -No acute distress noted.  -No complaints of shortness of breath.  -No complaints of chest pain.   Cardiac Monitoring: Box # [id number] in place. Cardiac monitor yields:normal sinus rhythm.  Blood pressure 160/79, pulse 67, temperature 98.3 F (36.8 C), temperature source Oral, resp. rate 17, height 4\' 11"  (1.499 m), weight 61.644 kg (135 lb 14.4 oz), SpO2 97.00%.   IV Fluids:  IV in place, occlusive dsg intact without redness, IV cath antecubital left, condition patent and no redness none.   Allergies:  Codeine; Propoxyphene hcl; and Propoxyphene n-acetaminophen  Past Medical History:   has a past medical history of Chicken pox; Headaches, cluster; GERD (gastroesophageal reflux disease); Glaucoma; Urine incontinence; Arthritis; and Lupus.  Past Surgical History:   has past surgical history that includes Cholecystectomy (1970); Appendectomy (1970); Tonsillectomy and adenoidectomy (1955); Abdominal hysterectomy; Vaginal delivery; Cesarean section; Foot surgery (years ago); and Cervical spine surgery.  Social History:   reports that she has never smoked. She has never used smokeless tobacco. She reports that she does not drink alcohol or use illicit drugs.  Skin: WDL  Patient/Family oriented to room. Information packet given to patient/family. Admission inpatient armband information verified with patient/family to include name and date of birth and placed on patient arm. Side rails up x 2, fall assessment and education completed with patient/family. Patient/family able to verbalize understanding of risk associated with falls and verbalized understanding to call for assistance before getting out of bed. Call light within reach. Patient/family able to voice  and demonstrate understanding of unit orientation instructions.  Rapid response RN called for stroke swallow screen and NIH score as ordered. Will continue to monitor.

## 2014-03-02 NOTE — Progress Notes (Signed)
PATIENT DETAILS Name: Sandy Merritt Age: 80 y.o. Sex: female Date of Birth: Mar 30, 1932 Admit Date: 03/01/2014 Admitting Physician Berle Mull, MD ZOX:[id number] AZBELL, MD  Subjective: Better this morning, less dizziness. No other complaints.  Assessment/Plan: Principal Problem:   Dizziness - Suspect peripheral vertigo-likely BPPV-per PT Dix Hallpike maneuver positive - CT head negative, await MRI brain-however low suspicion for posterior circulation CVA. Appreciate neurology evaluation. - Continue with meclizine.  Active Problems: Nausea with vomiting - Likely related to above. Resolved with supportive measures. - Although CT of the abdomen shows mild intrahepatic and extrahepatic biliary ductal dilatation-LFTs are normal, suspect this is likely secondary to cholecystectomy status. Currently her abdomen exam is completely benign. Will defer further inpatient workup at this time.  Hyponatremia  - Likely secondary to dehydration from nausea and vomiting - Resolved, stop IV fluids. Continue to monitor periodically  Hypertension - BP currently controlled-currently not on any antihypertensive medications  History of rheumatoid arthritis - Stable, continue Plaquenil  Gastroesophageal reflux disease - Continue with her time  Disposition: Remain inpatient  DVT Prophylaxis: Prophylactic Lovenox  Code Status: Full code  Family Communication Daughter at bedside  Procedures:  None  CONSULTS:  neurology  Time spent 40 minutes-which includes 50% of the time with face-to-face with patient/ family and coordinating care related to the above assessment and plan.    MEDICATIONS: Scheduled Meds: .  stroke: mapping our early stages of recovery book   Does not apply Once  . aspirin  81 mg Oral Daily  . enoxaparin (LOVENOX) injection  40 mg Subcutaneous Q24H  . hydroxychloroquine  200 mg Oral BID  . pantoprazole  40 mg Oral Daily  . [START ON 03/03/2014]  polyethylene glycol  17 g Oral Once per day on Mon Wed Fri  . pregabalin  150 mg Oral BID   Continuous Infusions:  PRN Meds:.ondansetron (ZOFRAN) IV, zolpidem  Antibiotics: Anti-infectives   Start     Dose/Rate Route Frequency Ordered Stop   03/01/14 2200  hydroxychloroquine (PLAQUENIL) tablet 200 mg     200 mg Oral 2 times daily 03/01/14 2051         PHYSICAL EXAM: Vital signs in last 24 hours: Filed Vitals:   03/01/14 1930 03/01/14 2127 03/02/14 0010 03/02/14 0408  BP: 122/99 160/79 144/73 128/73  Pulse: 59 67 67 64  Temp:  98.3 F (36.8 C) 98.3 F (36.8 C) 98.4 F (36.9 C)  TempSrc:  Oral Oral Oral  Resp:   16 16  Height:  $Remove'4\' 11"'zbsIrdJ$  (1.499 m)    Weight:  61.644 kg (135 lb 14.4 oz)    SpO2: 96% 97% 94% 96%    Weight change:  Filed Weights   03/01/14 2127  Weight: 61.644 kg (135 lb 14.4 oz)   Body mass index is 27.43 kg/(m^2).   Gen Exam: Awake and alert with clear speech.   Neck: Supple, No JVD.   Chest: B/L Clear.   CVS: S1 S2 Regular, no murmurs.  Abdomen: soft, BS +, non tender, non distended.  Extremities: no edema, lower extremities warm to touch. Neurologic: Non Focal.  No nystagmus. Skin: No Rash.   Wounds: N/A.   Intake/Output from previous day:  Intake/Output Summary (Last 24 hours) at 03/02/14 1059 Last data filed at 03/02/14 0400  Gross per 24 hour  Intake      0 ml  Output    975 ml  Net   -975 ml  LAB RESULTS: CBC  Recent Labs Lab 02/28/14 1646 03/01/14 1255 03/01/14 1421  WBC 9.4 11.3*  --   HGB 12.8 12.1 13.9  HCT 37.4 34.8* 41.0  PLT 301 301  --   MCV 83.3 82.1  --   MCH 28.5 28.5  --   MCHC 34.2 34.8  --   RDW 14.5 13.4  --     Chemistries   Recent Labs Lab 03/01/14 1255 03/01/14 1421 03/01/14 1948 03/02/14 0100 03/02/14 0550 03/02/14 0925  NA 127* 126* 134* 134* 139 141  K 3.4* 3.0* 3.4* 3.2* 3.6* 3.9  CL 87* 96 98 98 103 102  CO2 19  --  $R'20 23 22 24  'UP$ GLUCOSE 108* 116* 109* 108* 90 118*  BUN $Re'10 9 8 8 9 10   'slx$ CREATININE 0.63 0.70 0.68 0.71 0.75 0.80  CALCIUM 8.9  --  8.6 8.7 8.9 9.2    CBG:  Recent Labs Lab 03/01/14 2149 03/02/14 0807  GLUCAP 108* 98    GFR Estimated Creatinine Clearance: 45.5 ml/min (by C-G formula based on Cr of 0.8).  Coagulation profile No results found for this basename: INR, PROTIME,  in the last 168 hours  Cardiac Enzymes  Recent Labs Lab 02/28/14 1646  TROPONINI 0.02    No components found with this basename: POCBNP,  No results found for this basename: DDIMER,  in the last 72 hours No results found for this basename: HGBA1C,  in the last 72 hours  Recent Labs  03/02/14 0550  CHOL 193  HDL 69  LDLCALC 95  TRIG 143  CHOLHDL 2.8    Recent Labs  03/01/14 1948  TSH 3.390   No results found for this basename: VITAMINB12, FOLATE, FERRITIN, TIBC, IRON, RETICCTPCT,  in the last 72 hours  Recent Labs  03/01/14 1255  LIPASE 20    Urine Studies No results found for this basename: UACOL, UAPR, USPG, UPH, UTP, UGL, UKET, UBIL, UHGB, UNIT, UROB, ULEU, UEPI, UWBC, URBC, UBAC, CAST, CRYS, UCOM, BILUA,  in the last 72 hours  MICROBIOLOGY: No results found for this or any previous visit (from the past 240 hour(s)).  RADIOLOGY STUDIES/RESULTS: Dg Chest 2 View  03/01/2014   CLINICAL DATA:  Nausea, vomiting, altered mental status, history lupus  EXAM: CHEST  2 VIEW  COMPARISON:  None.  FINDINGS: Borderline enlargement of cardiac silhouette.  Normal mediastinal contours and pulmonary vascularity.  Lungs hyperinflated but clear.  No infiltrate, pleural effusion or pneumothorax.  Osseous demineralization.  Prior cervical spine fusion.  IMPRESSION: Borderline enlargement of cardiac silhouette.  Hyperinflation without acute infiltrate.   Electronically Signed   By: Lavonia Dana M.D.   On: 03/01/2014 16:18   Ct Head Wo Contrast  03/01/2014   CLINICAL DATA:  Confusion.  EXAM: CT HEAD WITHOUT CONTRAST  TECHNIQUE: Contiguous axial images were obtained from the  base of the skull through the vertex without intravenous contrast.  COMPARISON:  None.  FINDINGS: There is some cortical atrophy. No evidence of acute intracranial abnormality including infarct, hemorrhage, mass lesion, mass effect, midline shift or abnormal extra-axial fluid collection is identified. There is no hydrocephalus or pneumocephalus. Atherosclerosis is noted. The calvarium is intact.  IMPRESSION: No acute abnormality.   Electronically Signed   By: Inge Rise M.D.   On: 03/01/2014 18:43   Ct Abdomen Pelvis W Contrast  03/01/2014   CLINICAL DATA:  Nausea, vomiting, diarrhea  EXAM: CT ABDOMEN AND PELVIS WITH CONTRAST  TECHNIQUE: Multidetector CT imaging of the  abdomen and pelvis was performed using the standard protocol following bolus administration of intravenous contrast.  CONTRAST:  170mL OMNIPAQUE IOHEXOL 300 MG/ML  SOLN  COMPARISON:  None  FINDINGS: Lung bases are clear.  No pleural fluid.  No pericardial fluid.  Note 2 low-density lesions in the left hepatic lobe. The larger has simple fluid attenuation consistent benign cyst (image 20, series 2). The smaller lesion inferior lateral left hepatic lobe cannot be fully characterized and measures 10 mm. This calcification in the posterior right hepatic lobe.  Mild intrahepatic biliary duct dilatation noted on the delayed imaging. There is extrahepatic dilatation dilatation with the common bile duct measuring up to 13 mm and the common hepatic duct measuring up to 14 mm. Patient status post cholecystectomy. No dilatation the pancreatic duct. The pancreas parenchyma is normal. The spleen, adrenal glands, kidneys are normal.  The stomach, small bowel and cecum are normal. Appendix is not identified within a secondary signs of appendicitis. Colon rectosigmoid colon are normal.  Abdominal or is normal caliber. No retroperitoneal periportal lymphadenopathy.  No free fluid the pelvis. Post hysterectomy anatomy. There is severe degenerate change in the  lower lumbar spine.  IMPRESSION: 1. Intrahepatic and extrahepatic biliary duct dilatation likely relates to prior cholecystectomy. Recommend correlation with bilirubin levels and if elevated consider MRCP. 2. Liver lesion likely represent benign cysts. 3. No evidence of bowel obstruction.   Electronically Signed   By: Suzy Bouchard M.D.   On: 03/01/2014 17:10   Dg Foot Complete Right  02/19/2014   3 views of the right foot demonstrates a postsurgical foot osseously  mature in nature. 2 screws 1 to the proximal phalanx of the hallux right  and 1 to the second metatarsal right. She has had been arthroplasty which  we will do a fusion at the PIPJ of the second digit right foot. She has  medial dislocation of the second toe. Otherwise osteoarthritis of the  first metatarsophalangeal joint appears to be present and I shaved base of  the proximal phalanx second toe right foot does not correspond with the  head of the second metatarsal. And what appears to be a Taylor's  bunionectomy a condylectomy and fifth met head is present. Otherwise no  fractures are noted. An old fracture to the medial navicular.   Oren Binet, MD  [facility] Pager:[phone]  If 7PM-7AM, please contact night-coverage [email or web address] Password [email or web address] 03/02/2014, 10:59 AM   LOS: 1 day   **Disclaimer: This note may have been dictated with voice recognition software. Similar sounding words can inadvertently be transcribed and this note may contain transcription errors which may not have been corrected upon publication of note.**

## 2014-03-02 NOTE — ED Provider Notes (Signed)
Medical screening examination/treatment/procedure(s) were conducted as a shared visit with non-physician practitioner(s) and myself.  I personally evaluated the patient during the encounter.   EKG Interpretation T wave inversions noted laterally new when compared to prior    This is a 80 year old female who presents with nausea, dizziness, and abdominal pain. Onset of symptoms for the last 24 hours. 2 episodes of nonbloody diarrhea. No vomiting. She's had right lower quadrant abdominal pain. On my exam she is comfortable but she started medicated. She reports improvement with fluids. Vital signs are reassuring. She is tender to palpation over the right lower quadrant without rebound or guarding.  Lab work notable for hyponatremia and hypokalemia.   Mild leukocytosis. Will obtain CT scan to rule out intra-abdominal pathology.  CT scan pending at time of signout.     Merryl Hacker, MD 03/02/14 [phone]

## 2014-03-02 NOTE — ED Provider Notes (Signed)
I saw and evaluated the patient, reviewed the resident's note and I agree with the findings and plan.   EKG Interpretation None     Patient with dizziness. Mild lymphoid abnormalities. Unable ambulate. Does have some nystagmus. Will admit to internal medicine  Jasper Riling. Alvino Chapel, MD 03/02/14 [phone]

## 2014-03-02 NOTE — Evaluation (Signed)
Physical Therapy Evaluation Patient Details Name: Sandy Merritt MRN: [id number] DOB: 1931/07/25 Today's Date: 03/02/2014   History of Present Illness  Sandy Merritt is a 80 y.o. female admitted 8/14 with Past medical history of GERD, hypertension, rheumatoid arthritis on Plaquenil. The patient presented with complaints of dizziness and lightheadedness when she stands up associated with nausea and diaphoresis. She also has severe abdominal pain in her lower abdomen. Her symptoms pretty much resolved when she lies down. Per pt she reports falling on thursday 8/13 which is when her symptoms started.  Clinical Impression  Pt admitted with above. Pt admits to falling in garage on thurs 8/13 and possible hitting head. Pt demo'd normal smooth pursuits and saccadic mvts. Pt denies dizziness and negative nystagmus with horizontal roll test. Pt did report of significant dizziness with L dix hallpike despite no nystagmus. Tested R [employer] as well and pt reports mild dizziness compared to L. Pt treated for both L and R posterior canal BPPV. Pt reports dizziness to be better than before PT came in however dizziness still present upon stand at about a 5/10. Pt unsafe to d/c home alone at this time due to impaired balance and increased falls risk. Pt requires use of RW for safe ambulation at this time. Pt to require use of RW and 24/7 supervision for safe d/c home. Daughter present agrees. Spoke with MD regarding d/c recs.    Follow Up Recommendations Outpatient PT;Supervision/Assistance - 24 hour (for vestibular rehab)    Equipment Recommendations  Rolling walker with 5" wheels    Recommendations for Other Services       Precautions / Restrictions Precautions Precautions: Fall Precaution Comments: L post canal BppV Restrictions Weight Bearing Restrictions: No      Mobility  Bed Mobility Overal bed mobility: Needs Assistance Bed Mobility: Supine to Sit;Sit to Supine     Supine to sit:  Min guard Sit to supine: Min guard   General bed mobility comments: increased time, guarded due to onset of dizziness  Transfers Overall transfer level: Needs assistance Equipment used: 1 person hand held assist Transfers: Sit to/from Stand Sit to Stand: Min guard         General transfer comment: pt very slow/cautious due to dizziness, pt desired to hold onto PTs hand  Ambulation/Gait Ambulation/Gait assistance: Min assist Ambulation Distance (Feet): 20 Feet (100 with RW) Assistive device: Rolling walker (2 wheeled);1 person hand held assist Gait Pattern/deviations: Step-through pattern;Decreased stride length;Shuffle Gait velocity: slow and guarded   General Gait Details: pt with increased stability with RW. Pt constantly reaching for counter/hallway rail and gripping PT's hand when amb without RW. Pt con't to report dizziness with ambulation however less than before she came to the hospital  Stairs            Wheelchair Mobility    Modified Rankin (Stroke Patients Only)       Balance                                             Pertinent Vitals/Pain Pain Assessment: No/denies pain    Home Living Family/patient expects to be discharged to:: Private residence Living Arrangements: Alone Available Help at Discharge: Family;Available 24 hours/day Type of Home: House Home Access: Stairs to enter Entrance Stairs-Rails: Right;Can reach both (garage R HR and front bilat HR) Entrance Stairs-Number of Steps: 2  Home Layout: One level Home Equipment: Cane - single point      Prior Function Level of Independence: Independent         Comments: pt drives, 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     Hand Dominance   Dominant Hand: Right    Extremity/Trunk Assessment   Upper Extremity Assessment: Overall WFL for tasks assessed           Lower Extremity Assessment: Overall WFL for tasks assessed      Cervical / Trunk Assessment: Kyphotic  (forward head)  Communication   Communication: No difficulties  Cognition Arousal/Alertness: Awake/alert Behavior During Therapy: WFL for tasks assessed/performed Overall Cognitive Status: Within Functional Limits for tasks assessed                      General Comments General comments (skin integrity, edema, etc.): Pt tested for posterior canal BPPV.    Exercises        Assessment/Plan    PT Assessment Patient needs continued PT services  PT Diagnosis Difficulty walking   PT Problem List Decreased strength;Decreased activity tolerance;Decreased balance;Decreased mobility (vestibular dysfunction)  PT Treatment Interventions DME instruction;Gait training;Stair training;Functional mobility training;Therapeutic activities;Therapeutic exercise (canal repositioning)   PT Goals (Current goals can be found in the Care Plan section) Acute Rehab PT Goals Patient Stated Goal: stop the dizziness PT Goal Formulation: With patient Time For Goal Achievement: 03/09/14 Potential to Achieve Goals: Good    Frequency Min 3X/week   Barriers to discharge        Co-evaluation               End of Session Equipment Utilized During Treatment: Gait belt Activity Tolerance: Patient tolerated treatment well (limited by dizziness) Patient left: in chair;with call bell/phone within reach;with family/visitor present Nurse Communication: Mobility status         Time: 8003-4917 PT Time Calculation (min): 49 min   Charges:   PT Evaluation $Initial PT Evaluation Tier I: 1 Procedure PT Treatments $Gait Training: 8-22 mins $Canalith Rep Proc: 8-22 mins   PT G Codes:          Kingsley Callander 03/02/2014, 9:15 AM  Kittie Plater, PT, DPT Pager #: [phone] Office #: [phone]

## 2014-03-03 ENCOUNTER — Encounter: Payer: Self-pay | Admitting: Internal Medicine

## 2014-03-03 ENCOUNTER — Telehealth: Payer: Self-pay | Admitting: Internal Medicine

## 2014-03-03 ENCOUNTER — Inpatient Hospital Stay (HOSPITAL_COMMUNITY): Payer: Medicare Other

## 2014-03-03 DIAGNOSIS — I10 Essential (primary) hypertension: Secondary | ICD-10-CM

## 2014-03-03 MED ORDER — ONDANSETRON 4 MG PO TBDP
4.0000 mg | ORAL_TABLET | Freq: Once | ORAL | Status: AC
Start: 2014-03-03 — End: 2014-02-28
  Administered 2014-02-28: 4 mg via ORAL

## 2014-03-03 MED ORDER — MECLIZINE HCL 25 MG PO TABS: 25.0000 mg | ORAL_TABLET | Freq: Three times a day (TID) | ORAL | Status: DC | PRN

## 2014-03-03 NOTE — Discharge Summary (Addendum)
PATIENT DETAILS Name: Sandy Merritt Age: 80 y.o. Sex: female Date of Birth: [birth year] MRN: [id number]. Admit Date: 03/01/2014 Admitting Physician: Berle Mull, MD FAO:[id number],HQIONGEX Marya Landry, MD  Recommendations for Outpatient Follow-up:  1. Gen Health Maintenance 2. Assess improvement of symptoms at next visit 3. Has mild CBD dilatation-but suspect this is secondary to cholecystectomy status. LFT's normal, abdominal exam benign. Please assess at next visit.  PRIMARY DISCHARGE DIAGNOSIS:  Principal Problem:   Dizziness Active Problems:   HYPERTENSION   ESOPHAGEAL STRICTURE   GERD   Vertigo   Nausea   Abdominal pain, lower   Hyponatremia   Hypokalemia      PAST MEDICAL HISTORY: Past Medical History  Diagnosis Date  . Chicken pox   . Headaches, cluster   . GERD (gastroesophageal reflux disease)   . Glaucoma   . Urine incontinence   . Arthritis     rheumatoid  . Lupus     DISCHARGE MEDICATIONS:   Medication List         aspirin 81 MG tablet  Take 81 mg by mouth as needed for pain.     conjugated estrogens vaginal cream  Commonly known as:  PREMARIN  Place 1 Applicatorful vaginally 2 (two) times a week.     esomeprazole 40 MG capsule  Commonly known as:  NEXIUM  Take 40 mg by mouth daily at 12 noon.     eszopiclone 3 MG Tabs  Generic drug:  Eszopiclone  Take 3 mg by mouth at bedtime. Take immediately before bedtime     hydroxychloroquine 200 MG tablet  Commonly known as:  PLAQUENIL  Take 1 tablet (200 mg total) by mouth 2 (two) times daily.     meclizine 25 MG tablet  Commonly known as:  ANTIVERT  Take 1 tablet (25 mg total) by mouth 3 (three) times daily as needed for dizziness or nausea.     multivitamin with minerals tablet  Take 1 tablet by mouth daily.     nystatin ointment  Commonly known as:  MYCOSTATIN  Apply 1 application topically as needed.     ondansetron 4 MG tablet  Commonly known as:  ZOFRAN  Take 1 tablet (4 mg total) by  mouth every 8 (eight) hours as needed.     polyethylene glycol packet  Commonly known as:  MIRALAX / GLYCOLAX  Take 17 g by mouth 3 (three) times a week.     pregabalin 150 MG capsule  Commonly known as:  LYRICA  Take 150 mg by mouth 2 (two) times daily.     PREMARIN 0.45 MG tablet  Generic drug:  estrogens (conjugated)  Take 0.45 mg by mouth Daily.        ALLERGIES:   Allergies  Allergen Reactions  . Codeine     REACTION: nausea and vomiting  . Propoxyphene Hcl     "darvon"....Marland KitchenREACTION: nausea and vomiting  . Propoxyphene N-Acetaminophen     Darvocet......Marland KitchenREACTION: nausea and vomiting    BRIEF HPI:  See H&P, Labs, Consult and Test reports for all details in brief, patient was admitted for evaluation of dizziness.  CONSULTATIONS:   neurology  PERTINENT RADIOLOGIC STUDIES: Dg Chest 2 View  03/01/2014   CLINICAL DATA:  Nausea, vomiting, altered mental status, history lupus  EXAM: CHEST  2 VIEW  COMPARISON:  None.  FINDINGS: Borderline enlargement of cardiac silhouette.  Normal mediastinal contours and pulmonary vascularity.  Lungs hyperinflated but clear.  No infiltrate, pleural effusion or pneumothorax.  Osseous demineralization.  Prior cervical spine fusion.  IMPRESSION: Borderline enlargement of cardiac silhouette.  Hyperinflation without acute infiltrate.   Electronically Signed   By: Lavonia Dana M.D.   On: 03/01/2014 16:18   Ct Head Wo Contrast  03/01/2014   CLINICAL DATA:  Confusion.  EXAM: CT HEAD WITHOUT CONTRAST  TECHNIQUE: Contiguous axial images were obtained from the base of the skull through the vertex without intravenous contrast.  COMPARISON:  None.  FINDINGS: There is some cortical atrophy. No evidence of acute intracranial abnormality including infarct, hemorrhage, mass lesion, mass effect, midline shift or abnormal extra-axial fluid collection is identified. There is no hydrocephalus or pneumocephalus. Atherosclerosis is noted. The calvarium is intact.   IMPRESSION: No acute abnormality.   Electronically Signed   By: Inge Rise M.D.   On: 03/01/2014 18:43   Mr Brain Wo Contrast  03/03/2014   CLINICAL DATA:  Dizziness and visual disturbance.  EXAM: MRI HEAD WITHOUT CONTRAST  TECHNIQUE: Multiplanar, multiecho pulse sequences of the brain and surrounding structures were obtained without intravenous contrast.  COMPARISON:  Head CT 03/01/2014.  FINDINGS: Diffusion imaging does not show any acute or subacute infarction. The brainstem and cerebellum are normal. The cerebral hemispheres show generalized atrophy but do not show any focal small or large vessel infarctions. No mass lesion, hemorrhage, hydrocephalus or extra-axial collection. No pituitary mass. No inflammatory sinus disease. No skull or skullbase lesion. Major vessels at the base of the brain show flow.  IMPRESSION: Age related atrophy. No evidence of acute or reversible finding. No focal insult.   Electronically Signed   By: Nelson Chimes M.D.   On: 03/03/2014 09:06   Ct Abdomen Pelvis W Contrast  03/01/2014   CLINICAL DATA:  Nausea, vomiting, diarrhea  EXAM: CT ABDOMEN AND PELVIS WITH CONTRAST  TECHNIQUE: Multidetector CT imaging of the abdomen and pelvis was performed using the standard protocol following bolus administration of intravenous contrast.  CONTRAST:  120mL OMNIPAQUE IOHEXOL 300 MG/ML  SOLN  COMPARISON:  None  FINDINGS: Lung bases are clear.  No pleural fluid.  No pericardial fluid.  Note 2 low-density lesions in the left hepatic lobe. The larger has simple fluid attenuation consistent benign cyst (image 20, series 2). The smaller lesion inferior lateral left hepatic lobe cannot be fully characterized and measures 10 mm. This calcification in the posterior right hepatic lobe.  Mild intrahepatic biliary duct dilatation noted on the delayed imaging. There is extrahepatic dilatation dilatation with the common bile duct measuring up to 13 mm and the common hepatic duct measuring up to 14  mm. Patient status post cholecystectomy. No dilatation the pancreatic duct. The pancreas parenchyma is normal. The spleen, adrenal glands, kidneys are normal.  The stomach, small bowel and cecum are normal. Appendix is not identified within a secondary signs of appendicitis. Colon rectosigmoid colon are normal.  Abdominal or is normal caliber. No retroperitoneal periportal lymphadenopathy.  No free fluid the pelvis. Post hysterectomy anatomy. There is severe degenerate change in the lower lumbar spine.  IMPRESSION: 1. Intrahepatic and extrahepatic biliary duct dilatation likely relates to prior cholecystectomy. Recommend correlation with bilirubin levels and if elevated consider MRCP. 2. Liver lesion likely represent benign cysts. 3. No evidence of bowel obstruction.   Electronically Signed   By: Suzy Bouchard M.D.   On: 03/01/2014 17:10   Dg Foot Complete Right  02/19/2014   3 views of the right foot demonstrates a postsurgical foot osseously  mature in nature. 2 screws 1 to the proximal phalanx of  the hallux right  and 1 to the second metatarsal right. She has had been arthroplasty which  we will do a fusion at the PIPJ of the second digit right foot. She has  medial dislocation of the second toe. Otherwise osteoarthritis of the  first metatarsophalangeal joint appears to be present and I shaved base of  the proximal phalanx second toe right foot does not correspond with the  head of the second metatarsal. And what appears to be a Taylor's  bunionectomy a condylectomy and fifth met head is present. Otherwise no  fractures are noted. An old fracture to the medial navicular.    PERTINENT LAB RESULTS: CBC:  Recent Labs  02/28/14 1646 03/01/14 1255 03/01/14 1421  WBC 9.4 11.3*  --   HGB 12.8 12.1 13.9  HCT 37.4 34.8* 41.0  PLT 301 301  --    CMET CMP     Component Value Date/Time   NA 137 03/02/2014 1143   K 3.6* 03/02/2014 1143   CL 100 03/02/2014 1143   CO2 23 03/02/2014 1143   GLUCOSE 93  03/02/2014 1143   BUN 11 03/02/2014 1143   CREATININE 0.77 03/02/2014 1143   CREATININE 0.73 02/28/2014 1646   CALCIUM 9.2 03/02/2014 1143   PROT 7.2 03/01/2014 1948   ALBUMIN 3.7 03/01/2014 1948   AST 39* 03/01/2014 1948   ALT 21 03/01/2014 1948   ALKPHOS 64 03/01/2014 1948   BILITOT 0.6 03/01/2014 1948   GFRNONAA 78* 03/02/2014 1143   GFRAA >90 03/02/2014 1143    GFR Estimated Creatinine Clearance: 45.5 ml/min (by C-G formula based on Cr of 0.77).  Recent Labs  03/01/14 1255  LIPASE 20    Recent Labs  02/28/14 1646  CKTOTAL 166  TROPONINI 0.02   No components found with this basename: POCBNP,  No results found for this basename: DDIMER,  in the last 72 hours  Recent Labs  03/02/14 0550  HGBA1C 6.0*    Recent Labs  03/02/14 0550  CHOL 193  HDL 69  LDLCALC 95  TRIG 143  CHOLHDL 2.8    Recent Labs  03/01/14 1948  TSH 3.390   No results found for this basename: VITAMINB12, FOLATE, FERRITIN, TIBC, IRON, RETICCTPCT,  in the last 72 hours Coags: No results found for this basename: PT, INR,  in the last 72 hours Microbiology: No results found for this or any previous visit (from the past 240 hour(s)).   BRIEF HOSPITAL COURSE:   Principal Problem: Dizziness  - Suspect peripheral vertigo-likely BPPV-per PT Dix Hallpike maneuver positive  - CT head negative, MRI brain negative for CVA. Appreciate neurology evaluation. Significantly improved, with almost complete resolution of her symptoms - Continue with meclizine prn on dishcarge  Active Problems:  Nausea with vomiting  - Likely related to above. Resolved with supportive measures.  - Although CT of the abdomen shows mild intrahepatic and extrahepatic biliary ductal dilatation-LFTs are normal, suspect this is likely secondary to cholecystectomy status. Currently her abdomen exam is completely benign. Will defer further inpatient workup at this time.   Hyponatremia  - Likely secondary to dehydration from nausea and  vomiting  - Resolved with IV fluids. Continue to monitor periodically   Hypertension  - BP currently controlled-currently not on any antihypertensive medications   History of Lupus - Stable, continue Plaquenil   Gastroesophageal reflux disease  - Continue with PPI  TODAY-DAY OF DISCHARGE:  Subjective:   Sandy Merritt today has no headache,no chest abdominal pain,no new weakness  tingling or numbness, feels much better wants to go home today.   Objective:   Blood pressure 130/80, pulse 67, temperature 97.6 F (36.4 C), temperature source Oral, resp. rate 20, height $RemoveBe'4\' 11"'gEUZNWXXN$  (1.499 m), weight 61.644 kg (135 lb 14.4 oz), SpO2 98.00%. No intake or output data in the 24 hours ending 03/03/14 1225 Filed Weights   03/01/14 2127  Weight: 61.644 kg (135 lb 14.4 oz)    Exam Awake Alert, Oriented *3, No new F.N deficits, Normal affect Staves.AT,PERRAL Supple Neck,No JVD, No cervical lymphadenopathy appriciated.  Symmetrical Chest wall movement, Good air movement bilaterally, CTAB RRR,No Gallops,Rubs or new Murmurs, No Parasternal Heave +ve B.Sounds, Abd Soft, Non tender, No organomegaly appriciated, No rebound -guarding or rigidity. No Cyanosis, Clubbing or edema, No new Rash or bruise  DISCHARGE CONDITION: Stable  DISPOSITION: Home  DISCHARGE INSTRUCTIONS:    Activity:  As tolerated   Diet recommendation: Heart Healthy diet      Discharge Instructions   Diet - low sodium heart healthy    Complete by:  As directed      Increase activity slowly    Complete by:  As directed            Follow-up Information   Follow up with Rica Mast, MD. Schedule an appointment as soon as possible for a visit in 1 week.   Specialty:  Internal Medicine   Contact information:   [address] [place] [address] [phone]       Total Time spent on discharge equals 45 minutes.  SignedOren Binet 03/03/2014 12:25 PM  **Disclaimer: This  note may have been dictated with voice recognition software. Similar sounding words can inadvertently be transcribed and this note may contain transcription errors which may not have been corrected upon publication of note.**

## 2014-03-03 NOTE — Evaluation (Signed)
Occupational Therapy Evaluation Patient Details Name: Sandy Merritt MRN: [id number] DOB: [birth year] Today's Date: 03/03/2014    History of Present Illness Sandy Merritt is a 80 y.o. female admitted 8/14 with Past medical history of GERD, hypertension, rheumatoid arthritis on Plaquenil. The patient presented with complaints of dizziness and lightheadedness when she stands up associated with nausea and diaphoresis. She also has severe abdominal pain in her lower abdomen. Her symptoms pretty much resolved when she lies down. Per pt she reports falling on thursday 8/13 which is when her symptoms started.   Clinical Impression   PT admitted with dizziness s/p fall . Pt currently with functional limitiations due to the deficits listed below (see OT problem list).  Pt will benefit from skilled OT to increase their independence and safety with adls and balance to allow discharge outpatient. Ot to follow acutely for dynamic balance with adl retraining.     Follow Up Recommendations  Outpatient OT    Equipment Recommendations  None recommended by OT    Recommendations for Other Services       Precautions / Restrictions Precautions Precautions: Fall Precaution Comments: L post canal BppV      Mobility Bed Mobility Overal bed mobility: Needs Assistance Bed Mobility: Supine to Sit;Sit to Supine     Supine to sit: Supervision Sit to supine: Supervision   General bed mobility comments: Pt using railing for support during transitions and taking extra time to get to EOB.   Transfers Overall transfer level: Needs assistance Equipment used: None Transfers: Sit to/from Stand Sit to Stand: Supervision         General transfer comment: very slow and cautious with motion. pt states "i just dont want to fall    Balance Overall balance assessment: Needs assistance Sitting-balance support: Feet supported;No upper extremity supported Sitting balance-Leahy Scale: Good     Standing  balance support: Single extremity supported Standing balance-Leahy Scale: Fair Standing balance comment: for dynamic tasks she is safer with at least one hand supported.                             ADL                                               Vision                     Perception     Praxis      Pertinent Vitals/Pain Pain Assessment: No/denies pain     Hand Dominance Right   Extremity/Trunk Assessment Upper Extremity Assessment Upper Extremity Assessment: Overall WFL for tasks assessed   Lower Extremity Assessment Lower Extremity Assessment: Overall WFL for tasks assessed   Cervical / Trunk Assessment Cervical / Trunk Assessment: Kyphotic   Communication Communication Communication: No difficulties   Cognition Arousal/Alertness: Awake/alert Behavior During Therapy: WFL for tasks assessed/performed Overall Cognitive Status: Within Functional Limits for tasks assessed                     General Comments       Exercises       Shoulder Instructions      Home Living Family/patient expects to be discharged to:: Private residence Living Arrangements: Alone Available Help at Discharge: Family;Available 24 hours/day Type of Home: [place]  Access: Stairs to enter [employer] of Steps: 2 Entrance Stairs-Rails: Right;Can reach both Home Layout: One level     Bathroom Shower/Tub: Walk-in shower;Door   [employer] Toilet: Standard     Home Equipment: [employer] - single point          Prior Functioning/Environment Level of Independence: Independent        Comments: pt drives, 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    OT Diagnosis: Generalized weakness   OT Problem List: Decreased strength;Decreased activity tolerance;Impaired balance (sitting and/or standing)   OT Treatment/Interventions: Self-care/ADL training;Therapeutic exercise;DME and/or AE instruction;Therapeutic activities;Balance training     OT Goals(Current goals can be found in the care plan section) Acute Rehab OT Goals Patient Stated Goal: to go home soon and return to volunteer work at [facility] OT Goal Formulation: With patient Time For Goal Achievement: 03/17/14 Potential to Achieve Goals: Good  OT Frequency: Min 2X/week   Barriers to D/C:            Co-evaluation              End of Session Equipment Utilized During Treatment: Rolling walker Nurse Communication: Mobility status;Precautions  Activity Tolerance: Patient tolerated treatment well Patient left: in bed;with call bell/phone within reach   Time: 0930-1006 OT Time Calculation (min): 36 min Charges:  OT General Charges $OT Visit: 1 Procedure OT Evaluation $Initial OT Evaluation Tier I: 1 Procedure OT Treatments $Self Care/Home Management : 23-37 mins G-Codes:    Parke Poisson B 2014-03-10, 11:36 AM Pager: [phone]

## 2014-03-03 NOTE — Progress Notes (Signed)
[name] discharged Home with daughter and will stay at home with son at night per MD order.  Discharge instructions reviewed and discussed with the patient & pt. Daughter, all questions and concerns answered. Copy of instructions and scripts given to patient.    Medication List         aspirin 81 MG tablet  Take 81 mg by mouth as needed for pain.     conjugated estrogens vaginal cream  Commonly known as:  PREMARIN  Place 1 Applicatorful vaginally 2 (two) times a week.     esomeprazole 40 MG capsule  Commonly known as:  NEXIUM  Take 40 mg by mouth daily at 12 noon.     eszopiclone 3 MG Tabs  Generic drug:  Eszopiclone  Take 3 mg by mouth at bedtime. Take immediately before bedtime     hydroxychloroquine 200 MG tablet  Commonly known as:  PLAQUENIL  Take 1 tablet (200 mg total) by mouth 2 (two) times daily.     meclizine 25 MG tablet  Commonly known as:  ANTIVERT  Take 1 tablet (25 mg total) by mouth 3 (three) times daily as needed for dizziness or nausea.     multivitamin with minerals tablet  Take 1 tablet by mouth daily.     nystatin ointment  Commonly known as:  MYCOSTATIN  Apply 1 application topically as needed.     ondansetron 4 MG tablet  Commonly known as:  ZOFRAN  Take 1 tablet (4 mg total) by mouth every 8 (eight) hours as needed.     polyethylene glycol packet  Commonly known as:  MIRALAX / GLYCOLAX  Take 17 g by mouth 3 (three) times a week.     pregabalin 150 MG capsule  Commonly known as:  LYRICA  Take 150 mg by mouth 2 (two) times daily.     PREMARIN 0.45 MG tablet  Generic drug:  estrogens (conjugated)  Take 0.45 mg by mouth Daily.        Patients skin is clean, dry and intact, no evidence of skin break down. IV site discontinued and catheter remains intact. Site without signs and symptoms of complications. Dressing and pressure applied.  Patient escorted to car by NT in a wheelchair,  no distress noted upon discharge.  Wynetta Emery,  Berk Pilot C 03/03/2014 3:04 PM

## 2014-03-03 NOTE — Care Management Note (Signed)
    Page 1 of 1   03/03/2014     12:41:12 PM CARE MANAGEMENT NOTE 03/03/2014  Patient:  Sandy Merritt, Sandy Merritt   Account Number:  [id number]  Date Initiated:  03/03/2014  Documentation initiated by:  Sandy Merritt  Subjective/Objective Assessment:   dx ams, dizziness  admit- lives alone.     Action/Plan:   pt eval- rec out pt pt.   Anticipated DC Date:  03/03/2014   Anticipated DC Plan:  [place]  CM consult      Choice offered to / List presented to:             Status of service:  Completed, signed off Medicare Important Message given?  NA - LOS <3 / Initial given by admissions (If response is "NO", the following Medicare IM given date [place] will be blank) Date Medicare IM given:   Medicare IM given by:   Date Additional Medicare IM given:   Additional Medicare IM given by:    Discharge Disposition:  HOME/SELF CARE  Per UR Regulation:  Reviewed for med. necessity/level of care/duration of stay  If discussed at [place] of Stay Meetings, dates discussed:    Comments:  03/03/14 Sandy Merritt, BSN [id number] patient lives alone, per physicla therapy eval rec out pt pt.  NCM sent referral through epic [facility] on [address]. , gave patient phone number and directions to rehab center. They will contact patient by phone to scheduled apt.

## 2014-03-03 NOTE — Progress Notes (Signed)
Patient declines stroke education pending results of MRI. Will continue to monitor.

## 2014-03-03 NOTE — Addendum Note (Signed)
Addended by: Lurlean Nanny on: 03/03/2014 02:49 PM   Modules accepted: Orders

## 2014-03-03 NOTE — Telephone Encounter (Signed)
Need appt time

## 2014-03-03 NOTE — Progress Notes (Signed)
OT Cancellation Note  Patient Details Name: Sandy Merritt MRN: [id number] DOB: 1932/04/17   Cancelled Treatment:    Reason Eval/Treat Not Completed: Patient at procedure or test/ unavailable (MRI). Pt currently off the unit  Peri Maris Pager: [phone]  03/03/2014, 8:30 AM

## 2014-03-03 NOTE — Progress Notes (Signed)
Physical Therapy Treatment Patient Details Name: Sandy Merritt MRN: [id number] DOB: 08-27-1931 Today's Date: 03/03/2014    History of Present Illness Sandy Merritt is a 80 y.o. female admitted 02/28/14 with past medical history of HTN, RA, glaucoma, and foot surgery. The patient presented with complaints of dizziness and lightheadedness when she stands up associated with nausea and diaphoresis. She also has severe abdominal pain in her lower abdomen. Her symptoms pretty much resolved when she lies down. Per pt she reports falling on thursday 8/13 which is when her symptoms started.  MRI negative for acute infarct.     PT Comments    Pt tested negative for both symptoms and nystagmus for posterior canal BPPV today.  I consider this a successful treatment since last session.  Pt reports that her vision has significantly improved since being treated as well. She is still mildly unsteady on her feet and does still get a mild sense of dysequilibrium when going to standing and with head turns during gait.  She is much safer and requires less assistance during gait with RW.  Continue to recommend OP PT for vestibular/balance therapy and 24/7 assist when first going home (pt reports her family has arranged "shifts" to accommodate this recommendation).    Follow Up Recommendations  Outpatient PT;Supervision/Assistance - 24 hour (vestibular/balance therapy)     Equipment Recommendations  Rolling walker with 5" wheels    Recommendations for Other Services   NA     Precautions / Restrictions Precautions Precautions: Fall Precaution Comments: L post canal BppV    Mobility  Bed Mobility Overal bed mobility: Modified Independent Bed Mobility: Supine to Sit;Sit to Supine     Supine to sit: Supervision Sit to supine: Supervision   General bed mobility comments: Pt using railing for support during transitions and taking extra time to get to EOB.   Transfers Overall transfer level: Needs  assistance Equipment used: None Transfers: Sit to/from Stand Sit to Stand: Supervision         General transfer comment: reliance on arms during transitions.  Supervision for safety as pt is reaching for external support from objects in the room once standing.   Ambulation/Gait Ambulation/Gait assistance: Min guard;Supervision Ambulation Distance (Feet): 200 Feet (150'x1 with HHA, 50'x 1 with RW) Assistive device: Rolling walker (2 wheeled);1 person hand held assist Gait Pattern/deviations: Step-through pattern;Staggering right;Staggering left Gait velocity: slow and guarded   General Gait Details: With no assistive device pt needs min guard hand held assist for balance and she tends to "furniture walk" in room.  With RW pt is supervision, but continues to have a slow, guarded gait pattern with increased staggering in both scenarios when preforming head turns and multitasking (walking and talking) during gait.            Balance Overall balance assessment: Needs assistance Sitting-balance support: Feet supported;No upper extremity supported Sitting balance-Leahy Scale: Good     Standing balance support: Single extremity supported Standing balance-Leahy Scale: Fair Standing balance comment: for dynamic tasks she is safer with at least one hand supported.                     Cognition Arousal/Alertness: Awake/alert Behavior During Therapy: WFL for tasks assessed/performed Overall Cognitive Status: Within Functional Limits for tasks assessed                         General Comments General comments (skin integrity, edema, etc.): Pt re  tested for posterior canal BPPV and was clear bil today with (-) nystagmus and (-) sympotms.  Pt also reports her vision has been significantly clearer since last PT treated her for posterior canal BPPV.  She continues to have mild symptoms during gait, so x1 exercises may be beneficial next session. I continued to encourage RW use  at home for increased independence and decreased fall risk.       Pertinent Vitals/Pain Pain Assessment: No/denies pain    Home Living Family/patient expects to be discharged to:: Private residence Living Arrangements: Alone Available Help at Discharge: Family;Available 24 hours/day Type of Home: House Home Access: Stairs to enter Entrance Stairs-Rails: Right;Can reach both Home Layout: One level Home Equipment: Cane - single point      Prior Function Level of Independence: Independent      Comments: pt drives, 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   PT Goals (current goals can now be found in the care plan section) Acute Rehab PT Goals Patient Stated Goal: stop the dizziness Progress towards PT goals: Progressing toward goals    Frequency  Min 3X/week    PT Plan Current plan remains appropriate       End of Session Equipment Utilized During Treatment: Gait belt Activity Tolerance: Patient tolerated treatment well Patient left: in bed;with call bell/phone within reach     Time: 1033-1050 PT Time Calculation (min): 17 min  Charges:  $Gait Training: 8-22 mins            Mckinzy Fuller B. [place], [place], DPT [phone]   03/03/2014, 11:00 AM

## 2014-03-03 NOTE — Telephone Encounter (Signed)
[facility] called in and stated patient was seen in the ED for dizzyness and is needing a hospital follow up.

## 2014-03-03 NOTE — Progress Notes (Signed)
Subjective: Patient still has mild dizziness but states it has 90% improved.   Objective: Current vital signs: BP 130/80  Pulse 67  Temp(Src) 97.6 F (36.4 C) (Oral)  Resp 20  Ht 4\' 11"  (1.499 m)  Wt 61.644 kg (135 lb 14.4 oz)  BMI 27.43 kg/m2  SpO2 98% Vital signs in last 24 hours: Temp:  [97.6 F (36.4 C)-98.6 F (37 C)] 97.6 F (36.4 C) (08/17 1035) Pulse Rate:  [65-70] 67 (08/17 1035) Resp:  [16-20] 20 (08/17 1035) BP: (109-132)/(61-80) 130/80 mmHg (08/17 1035) SpO2:  [96 %-98 %] 98 % (08/17 1035)  Intake/Output from previous day:   Intake/Output this shift:   Nutritional status: Cardiac  Neurologic Exam: General: NAD Mental Status: Alert, oriented, thought content appropriate.  Speech fluent without evidence of aphasia.  Able to follow 3 step commands without difficulty. Cranial Nerves: II: Discs flat bilaterally; Visual Leath grossly normal, pupils equal, round, reactive to light and accommodation III,IV, VI: ptosis not present, extra-ocular motions intact bilaterally V,VII: smile symmetric, facial light touch sensation normal bilaterally VIII: hearing normal bilaterally IX,X: gag reflex present XI: bilateral shoulder shrug XII: midline tongue extension without atrophy or fasciculations  Motor: Right : Upper extremity   5/5    Left:     Upper extremity   5/5  Lower extremity   5/5     Lower extremity   5/5 Tone and bulk:normal tone throughout; no atrophy noted Sensory: Pinprick and light touch intact throughout, bilaterally Deep Tendon Reflexes:  Right: Upper Extremity   Left: Upper extremity   biceps (C-5 to C-6) 2/4   biceps (C-5 to C-6) 2/4 tricep (C7) 2/4    triceps (C7) 2/4 Brachioradialis (C6) 2/4  Brachioradialis (C6) 2/4  Lower Extremity Lower Extremity  quadriceps (L-2 to L-4) 2/4   quadriceps (L-2 to L-4) 2/4 Achilles (S1) 2/4   Achilles (S1) 2/4  Plantars: Right: downgoing   Left: downgoing Cerebellar: normal finger-to-nose,  normal  heel-to-shin test CV: pulses palpable throughout    Lab Results: Basic Metabolic Panel:  Recent Labs Lab 03/01/14 1948 03/02/14 0100 03/02/14 0550 03/02/14 0925 03/02/14 1143  NA 134* 134* 139 141 137  K 3.4* 3.2* 3.6* 3.9 3.6*  CL 98 98 103 102 100  CO2 20 23 22 24 23   GLUCOSE 109* 108* 90 118* 93  BUN 8 8 9 10 11   CREATININE 0.68 0.71 0.75 0.80 0.77  CALCIUM 8.6 8.7 8.9 9.2 9.2    Liver Function Tests:  Recent Labs Lab 02/28/14 1646 03/01/14 1255 03/01/14 1948  AST 25 43* 39*  ALT 18 22 21   ALKPHOS 59 68 64  BILITOT 0.6 0.7 0.6  PROT 7.6 7.9 7.2  ALBUMIN 4.4 4.1 3.7    Recent Labs Lab 03/01/14 1255  LIPASE 20   No results found for this basename: AMMONIA,  in the last 168 hours  CBC:  Recent Labs Lab 02/28/14 1646 03/01/14 1255 03/01/14 1421  WBC 9.4 11.3*  --   HGB 12.8 12.1 13.9  HCT 37.4 34.8* 41.0  MCV 83.3 82.1  --   PLT 301 301  --     Cardiac Enzymes:  Recent Labs Lab 02/28/14 1646  CKTOTAL 166  TROPONINI 0.02    Lipid Panel:  Recent Labs Lab 03/02/14 0550  CHOL 193  TRIG 143  HDL 69  CHOLHDL 2.8  VLDL 29  LDLCALC 95    CBG:  Recent Labs Lab 03/01/14 2149 03/02/14 0807  GLUCAP 108* 98  Microbiology: Results for orders placed in visit on 11/21/13  CULTURE, URINE COMPREHENSIVE     Status: None   Collection Time    11/21/13  2:23 PM      Result Value Ref Range Status   Culture ESCHERICHIA COLI   Final   Colony Count 50,000 COLONIES/ML   Final   Organism ID, Bacteria ESCHERICHIA COLI   Final    Coagulation Studies: No results found for this basename: LABPROT, INR,  in the last 72 hours  Imaging: Dg Chest 2 View  03/01/2014   CLINICAL DATA:  Nausea, vomiting, altered mental status, history lupus  EXAM: CHEST  2 VIEW  COMPARISON:  None.  FINDINGS: Borderline enlargement of cardiac silhouette.  Normal mediastinal contours and pulmonary vascularity.  Lungs hyperinflated but clear.  No infiltrate, pleural  effusion or pneumothorax.  Osseous demineralization.  Prior cervical spine fusion.  IMPRESSION: Borderline enlargement of cardiac silhouette.  Hyperinflation without acute infiltrate.   Electronically Signed   By: Lavonia Dana M.D.   On: 03/01/2014 16:18   Ct Head Wo Contrast  03/01/2014   CLINICAL DATA:  Confusion.  EXAM: CT HEAD WITHOUT CONTRAST  TECHNIQUE: Contiguous axial images were obtained from the base of the skull through the vertex without intravenous contrast.  COMPARISON:  None.  FINDINGS: There is some cortical atrophy. No evidence of acute intracranial abnormality including infarct, hemorrhage, mass lesion, mass effect, midline shift or abnormal extra-axial fluid collection is identified. There is no hydrocephalus or pneumocephalus. Atherosclerosis is noted. The calvarium is intact.  IMPRESSION: No acute abnormality.   Electronically Signed   By: Inge Rise M.D.   On: 03/01/2014 18:43   Mr Brain Wo Contrast  03/03/2014   CLINICAL DATA:  Dizziness and visual disturbance.  EXAM: MRI HEAD WITHOUT CONTRAST  TECHNIQUE: Multiplanar, multiecho pulse sequences of the brain and surrounding structures were obtained without intravenous contrast.  COMPARISON:  Head CT 03/01/2014.  FINDINGS: Diffusion imaging does not show any acute or subacute infarction. The brainstem and cerebellum are normal. The cerebral hemispheres show generalized atrophy but do not show any focal small or large vessel infarctions. No mass lesion, hemorrhage, hydrocephalus or extra-axial collection. No pituitary mass. No inflammatory sinus disease. No skull or skullbase lesion. Major vessels at the base of the brain show flow.  IMPRESSION: Age related atrophy. No evidence of acute or reversible finding. No focal insult.   Electronically Signed   By: Nelson Chimes M.D.   On: 03/03/2014 09:06   Ct Abdomen Pelvis W Contrast  03/01/2014   CLINICAL DATA:  Nausea, vomiting, diarrhea  EXAM: CT ABDOMEN AND PELVIS WITH CONTRAST   TECHNIQUE: Multidetector CT imaging of the abdomen and pelvis was performed using the standard protocol following bolus administration of intravenous contrast.  CONTRAST:  170mL OMNIPAQUE IOHEXOL 300 MG/ML  SOLN  COMPARISON:  None  FINDINGS: Lung bases are clear.  No pleural fluid.  No pericardial fluid.  Note 2 low-density lesions in the left hepatic lobe. The larger has simple fluid attenuation consistent benign cyst (image 20, series 2). The smaller lesion inferior lateral left hepatic lobe cannot be fully characterized and measures 10 mm. This calcification in the posterior right hepatic lobe.  Mild intrahepatic biliary duct dilatation noted on the delayed imaging. There is extrahepatic dilatation dilatation with the common bile duct measuring up to 13 mm and the common hepatic duct measuring up to 14 mm. Patient status post cholecystectomy. No dilatation the pancreatic duct. The pancreas parenchyma is normal.  The spleen, adrenal glands, kidneys are normal.  The stomach, small bowel and cecum are normal. Appendix is not identified within a secondary signs of appendicitis. Colon rectosigmoid colon are normal.  Abdominal or is normal caliber. No retroperitoneal periportal lymphadenopathy.  No free fluid the pelvis. Post hysterectomy anatomy. There is severe degenerate change in the lower lumbar spine.  IMPRESSION: 1. Intrahepatic and extrahepatic biliary duct dilatation likely relates to prior cholecystectomy. Recommend correlation with bilirubin levels and if elevated consider MRCP. 2. Liver lesion likely represent benign cysts. 3. No evidence of bowel obstruction.   Electronically Signed   By: Suzy Bouchard M.D.   On: 03/01/2014 17:10    Medications:  Scheduled: .  stroke: mapping our early stages of recovery book   Does not apply Once  . aspirin  81 mg Oral Daily  . enoxaparin (LOVENOX) injection  40 mg Subcutaneous Q24H  . hydroxychloroquine  200 mg Oral BID  . pantoprazole  40 mg Oral Daily  .  polyethylene glycol  17 g Oral Once per day on Mon Wed Fri  . pregabalin  150 mg Oral BID    Assessment/Plan: 81-YO female with vertigo which has resolved almost completely. MRI brain negative for acute intracranial process. Likely BPPV.   No further workup recommended at this time. Neurology S/O.  Etta Quill PA-C [facility] [phone]  03/03/2014, 11:53 AM

## 2014-03-04 NOTE — Telephone Encounter (Signed)
Please make a 92min visit in 1-2 weeks. If none available, we can look at the schedule together.e

## 2014-03-05 NOTE — Telephone Encounter (Signed)
Patient has been scheduled and is aware of her appointment on 9.3.15 @ 2:30

## 2014-03-05 NOTE — Telephone Encounter (Signed)
Sandy Merritt please help me

## 2014-03-06 ENCOUNTER — Ambulatory Visit: Payer: Medicare Other | Attending: Internal Medicine | Admitting: Physical Therapy

## 2014-03-06 DIAGNOSIS — R269 Unspecified abnormalities of gait and mobility: Secondary | ICD-10-CM | POA: Insufficient documentation

## 2014-03-06 DIAGNOSIS — IMO0001 Reserved for inherently not codable concepts without codable children: Secondary | ICD-10-CM | POA: Insufficient documentation

## 2014-03-06 DIAGNOSIS — R42 Dizziness and giddiness: Secondary | ICD-10-CM | POA: Insufficient documentation

## 2014-03-20 ENCOUNTER — Encounter: Payer: Self-pay | Admitting: Internal Medicine

## 2014-03-20 ENCOUNTER — Ambulatory Visit (INDEPENDENT_AMBULATORY_CARE_PROVIDER_SITE_OTHER): Payer: Medicare Other | Admitting: Internal Medicine

## 2014-03-20 ENCOUNTER — Telehealth: Payer: Self-pay | Admitting: Internal Medicine

## 2014-03-20 VITALS — BP 130/76 | HR 64 | Temp 98.0°F | Ht 59.0 in | Wt 140.0 lb

## 2014-03-20 DIAGNOSIS — L259 Unspecified contact dermatitis, unspecified cause: Secondary | ICD-10-CM | POA: Insufficient documentation

## 2014-03-20 DIAGNOSIS — H8110 Benign paroxysmal vertigo, unspecified ear: Secondary | ICD-10-CM | POA: Insufficient documentation

## 2014-03-20 DIAGNOSIS — R110 Nausea: Secondary | ICD-10-CM

## 2014-03-20 MED ORDER — TRIAMCINOLONE ACETONIDE 0.1 % EX CREA: 1.0000 "application " | TOPICAL_CREAM | Freq: Two times a day (BID) | CUTANEOUS | Status: DC

## 2014-03-20 NOTE — Patient Instructions (Signed)
Start Triamcinolone to rash on left arm twice daily.  Follow up for recheck 2 weeks.

## 2014-03-20 NOTE — Telephone Encounter (Signed)
Pt needs 2 week recheck. Please advise where to add pt.msn

## 2014-03-20 NOTE — Assessment & Plan Note (Signed)
Symptoms have completely resolved. Question if viral infection may have led to symptoms. Reviewed recent CT which showed mild dilation of CBD, however this may be secondary to previous cholecystectomy. Will recheck electrolytes and LFTs with labs today. If any elevation of LFTs, plan for GI evaluation with possible MRCP.

## 2014-03-20 NOTE — Assessment & Plan Note (Signed)
Rash most consistent with contact dermatitis. Will start topical triamcinolone. Follow up prn if symptoms are not improving and in 2 weeks for recheck.

## 2014-03-20 NOTE — Assessment & Plan Note (Signed)
Symptoms have resolved. Reviewed MRI brain and CT brain from recent admission which were normal. Continue to monitor.

## 2014-03-20 NOTE — Progress Notes (Signed)
Subjective:    Patient ID: Sandy Merritt, female    DOB: 05-17-1932, 80 y.o.   MRN: [id number]  HPI 80YO female presents for hospital follow up.  8/15 admitted [facility] for dizziness and confusion. MRI brain was normal. Diagnosed with BPPV. Symptoms of dizziness have improved. No further nausea.  Rash - left arm present x 1 week. Occasionally itchy. Using some OTC cream with no improvement. No new lotions, soaps, detergents.  Review of Systems  Constitutional: Negative for fever, chills, appetite change, fatigue and unexpected weight change.  HENT: Negative for congestion.   Eyes: Negative for visual disturbance.  Respiratory: Negative for shortness of breath.   Cardiovascular: Negative for chest pain and leg swelling.  Gastrointestinal: Negative for nausea, vomiting, abdominal pain, diarrhea, constipation and blood in stool.  Musculoskeletal: Positive for arthralgias and myalgias.  Skin: Negative for color change and rash.  Neurological: Positive for dizziness. Negative for weakness, light-headedness and headaches.  Hematological: Negative for adenopathy. Does not bruise/bleed easily.  Psychiatric/Behavioral: Negative for sleep disturbance, dysphoric mood and decreased concentration. The patient is not nervous/anxious.        Objective:    BP 130/76  Pulse 64  Temp(Src) 98 F (36.7 C) (Oral)  Ht 4\' 11"  (1.499 m)  Wt 140 lb (63.504 kg)  BMI 28.26 kg/m2  SpO2 97% Physical Exam  Constitutional: She is oriented to person, place, and time. She appears well-developed and well-nourished. No distress.  HENT:  Head: Normocephalic and atraumatic.  Right Ear: External ear normal.  Left Ear: External ear normal.  Nose: Nose normal.  Mouth/Throat: Oropharynx is clear and moist. No oropharyngeal exudate.  Eyes: Conjunctivae and EOM are normal. Pupils are equal, round, and reactive to light. Right eye exhibits no discharge.  Neck: Normal range of motion. Neck supple. No  thyromegaly present.  Cardiovascular: Normal rate, regular rhythm, normal heart sounds and intact distal pulses.  Exam reveals no gallop and no friction rub.   No murmur heard. Pulmonary/Chest: Effort normal. No respiratory distress. She has no wheezes. She has no rales.  Abdominal: Soft. Bowel sounds are normal. She exhibits no distension and no mass. There is no tenderness. There is no rebound and no guarding.  Musculoskeletal: Normal range of motion. She exhibits no edema and no tenderness.  Lymphadenopathy:    She has no cervical adenopathy.  Neurological: She is alert and oriented to person, place, and time. No cranial nerve deficit. Coordination normal.  Skin: Skin is warm and dry. Rash noted. Rash is pustular (left arm around medial anticubital fossa and proximal to wrist). She is not diaphoretic. No erythema. No pallor.  Psychiatric: She has a normal mood and affect. Her behavior is normal. Judgment and thought content normal.          Assessment & Plan:   Problem List Items Addressed This Visit     Unprioritized   Benign paroxysmal positional vertigo - Primary     Symptoms have resolved. Reviewed MRI brain and CT brain from recent admission which were normal. Continue to monitor.    Relevant Orders      Comprehensive metabolic panel   Contact dermatitis     Rash most consistent with contact dermatitis. Will start topical triamcinolone. Follow up prn if symptoms are not improving and in 2 weeks for recheck.    Relevant Medications      TRIAMCINOLONE ACETONIDE 0.1% EX CREA   Nausea     Symptoms have completely resolved. Question if viral infection may  have led to symptoms. Reviewed recent CT which showed mild dilation of CBD, however this may be secondary to previous cholecystectomy. Will recheck electrolytes and LFTs with labs today. If any elevation of LFTs, plan for GI evaluation with possible MRCP.        Return in about 2 weeks (around 04/03/2014) for Recheck.

## 2014-03-20 NOTE — Progress Notes (Signed)
Pre visit review using our clinic review tool, if applicable. No additional management support is needed unless otherwise documented below in the visit note. 

## 2014-03-20 NOTE — Telephone Encounter (Signed)
Friday Sept 18th at 10am there is an open 79min physical slot

## 2014-03-20 NOTE — Telephone Encounter (Signed)
Please advise 

## 2014-03-21 ENCOUNTER — Telehealth: Payer: Self-pay | Admitting: Internal Medicine

## 2014-03-21 LAB — COMPREHENSIVE METABOLIC PANEL
ALT: 24 U/L (ref 0–35)
AST: 31 U/L (ref 0–37)
Albumin: 3.9 g/dL (ref 3.5–5.2)
Alkaline Phosphatase: 59 U/L (ref 39–117)
BILIRUBIN TOTAL: 0.6 mg/dL (ref 0.2–1.2)
BUN: 14 mg/dL (ref 6–23)
CO2: 30 mEq/L (ref 19–32)
Calcium: 9.2 mg/dL (ref 8.4–10.5)
Chloride: 102 mEq/L (ref 96–112)
Creatinine, Ser: 0.9 mg/dL (ref 0.4–1.2)
GFR: 67.51 mL/min (ref >60.00)
GLUCOSE: 86 mg/dL (ref 70–99)
Potassium: 4.6 mEq/L (ref 3.5–5.1)
SODIUM: 138 meq/L (ref 135–145)
TOTAL PROTEIN: 7.3 g/dL (ref 6.0–8.3)

## 2014-03-21 NOTE — Telephone Encounter (Signed)
LMTCB to give details of 9/18 appt.msn

## 2014-03-21 NOTE — Telephone Encounter (Signed)
Please call and see if pt can do this date and time

## 2014-04-04 ENCOUNTER — Ambulatory Visit (INDEPENDENT_AMBULATORY_CARE_PROVIDER_SITE_OTHER): Payer: Medicare Other | Admitting: Internal Medicine

## 2014-04-04 ENCOUNTER — Encounter: Payer: Self-pay | Admitting: Internal Medicine

## 2014-04-04 VITALS — BP 124/80 | HR 67 | Temp 97.9°F | Ht 59.0 in | Wt 141.5 lb

## 2014-04-04 DIAGNOSIS — H8110 Benign paroxysmal vertigo, unspecified ear: Secondary | ICD-10-CM

## 2014-04-04 DIAGNOSIS — L259 Unspecified contact dermatitis, unspecified cause: Secondary | ICD-10-CM

## 2014-04-04 DIAGNOSIS — G609 Hereditary and idiopathic neuropathy, unspecified: Secondary | ICD-10-CM | POA: Insufficient documentation

## 2014-04-04 NOTE — Progress Notes (Signed)
Pre visit review using our clinic review tool, if applicable. No additional management support is needed unless otherwise documented below in the visit note. 

## 2014-04-04 NOTE — Assessment & Plan Note (Signed)
Symptoms have resolved. Will monitor for any recurrence. 

## 2014-04-04 NOTE — Assessment & Plan Note (Signed)
Symptoms improved with Lyrica. Will continue.

## 2014-04-04 NOTE — Assessment & Plan Note (Signed)
Resolved without intervention. Will continue to monitor.

## 2014-04-04 NOTE — Progress Notes (Signed)
   Subjective:    Patient ID: Sandy Merritt [place], female    DOB: 02-07-1932, 80 y.o.   MRN: [id number]  HPI 80YO female presents for follow up.  Dizziness has resolved.  Rash on left arm has resolved without intervention.  No concerns today. Feeling well. Notes that her daughter is concerned about her use of Lyrica, however this medication has made huge improvement in nerve pain, without noted side effects. She does not want to stop medication.  Review of Systems  Constitutional: Negative for fever, chills, appetite change, fatigue and unexpected weight change.  Eyes: Negative for visual disturbance.  Respiratory: Negative for shortness of breath.   Cardiovascular: Negative for chest pain and leg swelling.  Gastrointestinal: Negative for abdominal pain.  Musculoskeletal: Negative for arthralgias and myalgias.  Skin: Negative for color change and rash.  Neurological: Negative for dizziness and light-headedness.  Hematological: Negative for adenopathy. Does not bruise/bleed easily.  Psychiatric/Behavioral: Negative for sleep disturbance and dysphoric mood. The patient is not nervous/anxious.        Objective:    BP 124/80  Pulse 67  Temp(Src) 97.9 F (36.6 C) (Oral)  Ht 4\' 11"  (1.499 m)  Wt 141 lb 8 oz (64.184 kg)  BMI 28.56 kg/m2  SpO2 98% Physical Exam  Constitutional: She is oriented to person, place, and time. She appears well-developed and well-nourished. No distress.  HENT:  Head: Normocephalic and atraumatic.  Right Ear: External ear normal.  Left Ear: External ear normal.  Nose: Nose normal.  Mouth/Throat: Oropharynx is clear and moist.  Eyes: Conjunctivae are normal. Pupils are equal, round, and reactive to light. Right eye exhibits no discharge. Left eye exhibits no discharge. No scleral icterus.  Neck: Normal range of motion. Neck supple. No tracheal deviation present. No thyromegaly present.  Cardiovascular: Normal rate, regular rhythm, normal heart sounds and  intact distal pulses.  Exam reveals no gallop and no friction rub.   No murmur heard. Pulmonary/Chest: Effort normal and breath sounds normal. No accessory muscle usage. Not tachypneic. No respiratory distress. She has no decreased breath sounds. She has no wheezes. She has no rhonchi. She has no rales. She exhibits no tenderness.  Musculoskeletal: Normal range of motion. She exhibits no edema and no tenderness.  Lymphadenopathy:    She has no cervical adenopathy.  Neurological: She is alert and oriented to person, place, and time. No cranial nerve deficit. She exhibits normal muscle tone. Coordination normal.  Skin: Skin is warm and dry. No rash noted. She is not diaphoretic. No erythema. No pallor.  Psychiatric: She has a normal mood and affect. Her behavior is normal. Judgment and thought content normal.          Assessment & Plan:   Problem List Items Addressed This Visit     Unprioritized   Benign paroxysmal positional vertigo - Primary     Symptoms have resolved. Will monitor for any recurrence.    Contact dermatitis     Resolved without intervention. Will continue to monitor.    Unspecified hereditary and idiopathic peripheral neuropathy     Symptoms improved with Lyrica. Will continue.        Return in about 4 months (around 08/04/2014) for Wellness Visit.

## 2014-04-04 NOTE — Patient Instructions (Signed)
Continue current medications.  Follow up in January for annual exam.

## 2014-05-01 ENCOUNTER — Other Ambulatory Visit: Payer: Self-pay | Admitting: Internal Medicine

## 2014-05-01 NOTE — Telephone Encounter (Signed)
Rx faxed

## 2014-05-01 NOTE — Telephone Encounter (Signed)
Last refill 9.10.15.  Last OV 9.18.15.  Not prescribed by you before.  Please advise refill

## 2014-05-06 ENCOUNTER — Ambulatory Visit (INDEPENDENT_AMBULATORY_CARE_PROVIDER_SITE_OTHER)
Admission: RE | Admit: 2014-05-06 | Discharge: 2014-05-06 | Disposition: A | Discharge status: Home / Self Care | Payer: Medicare Other | Source: Ambulatory Visit | Attending: Internal Medicine | Admitting: Internal Medicine

## 2014-05-06 ENCOUNTER — Telehealth: Payer: Self-pay | Admitting: Internal Medicine

## 2014-05-06 ENCOUNTER — Encounter: Payer: Self-pay | Admitting: *Deleted

## 2014-05-06 ENCOUNTER — Encounter: Payer: Self-pay | Admitting: Internal Medicine

## 2014-05-06 ENCOUNTER — Other Ambulatory Visit: Payer: Self-pay | Admitting: Internal Medicine

## 2014-05-06 ENCOUNTER — Ambulatory Visit (INDEPENDENT_AMBULATORY_CARE_PROVIDER_SITE_OTHER): Payer: Medicare Other | Admitting: Internal Medicine

## 2014-05-06 VITALS — BP 144/72 | HR 70 | Temp 98.2°F | Ht 59.0 in | Wt 141.2 lb

## 2014-05-06 DIAGNOSIS — J209 Acute bronchitis, unspecified: Secondary | ICD-10-CM

## 2014-05-06 DIAGNOSIS — R059 Cough, unspecified: Secondary | ICD-10-CM

## 2014-05-06 DIAGNOSIS — R05 Cough: Secondary | ICD-10-CM

## 2014-05-06 MED ORDER — AZITHROMYCIN 250 MG PO TABS: ORAL_TABLET | ORAL | Status: DC

## 2014-05-06 MED ORDER — BENZONATATE 200 MG PO CAPS: 200.0000 mg | ORAL_CAPSULE | Freq: Two times a day (BID) | ORAL | Status: DC | PRN

## 2014-05-06 NOTE — Telephone Encounter (Signed)
Patient Information:  Caller Name: Buffi  Phone: [phone]  Patient: Sandy Merritt  Gender: Female  DOB: 05-Jan-1932  Age: 80 Years  PCP: Ronette Deter (Adults only)  Office Follow Up:  Does the office need to follow up with this patient?: Yes  Instructions For The Office: Disposition is see today or tomorrow. Appts are full. Pt refused appts at other locations. She would go to [place] but they are booked today also. Please call her if there is a cancellation before Thursday.   Symptoms  Reason For Call & Symptoms: Pt is calling for an appt today and was given one on Thursday 05/01/14 by office/then transferred to nurse for triage. Pt feels like her symptoms cant wait until then/she wants to be seen today. Pt has a cough that she has had x 1 week. No fever. The cough is productive of dark sputum. Pt is afebrile. Pt denies any wheezing or difficulty breathing.  Reviewed Health History In EMR: Yes  Reviewed Medications In EMR: Yes  Reviewed Allergies In EMR: Yes  Reviewed Surgeries / Procedures: Yes  Date of Onset of Symptoms: 04/29/2014  Treatments Tried: cough drops; warm salt water throat gargle prn;  Treatments Tried Worked: No  Guideline(s) Used:  Cough  Disposition Per Guideline:   See Today or Tomorrow in Office  Reason For Disposition Reached:   Continuous (nonstop) coughing interferes with work or school and no improvement using cough treatment per Care Advice  Advice Given:  N/A  Patient Will Follow Care Advice:  YES

## 2014-05-06 NOTE — Patient Instructions (Addendum)
Start Azithromycin to help fight lung infection.  Use Tessalon twice daily as needed for cough.  Call immediately if you have worsening shortness of breath, fever, chest pain.  Follow up for recheck in 3-4 weeks.

## 2014-05-06 NOTE — Progress Notes (Signed)
Pre visit review using our clinic review tool, if applicable. No additional management support is needed unless otherwise documented below in the visit note. 

## 2014-05-06 NOTE — Assessment & Plan Note (Signed)
Symptoms and exam most consistent with acute bronchitis. Will start Azithromycin and use Tessalon as needed for cough. Encouraged rest, adequate fluids. Follow up if symptoms are not improving or in 3-4 weeks for recheck.

## 2014-05-06 NOTE — Telephone Encounter (Signed)
Pt notified and verbalized understanding.

## 2014-05-06 NOTE — Progress Notes (Signed)
   Subjective:    Patient ID: Sandy Merritt, female    DOB: Nov 11, 1931, 80 y.o.   MRN: [id number]  HPI 81YO female presents for acute visit.  Sick for two weeks with cough. Bringing up green colored sputum. Had some fever initially, but not recently. Feels short of breath at times.No chest pain. Volunteers at hospital, so numerous sick contacts. Trying OTC meds with no improvement. CXR today was normal.  Review of Systems  Constitutional: Positive for fever and fatigue. Negative for chills and unexpected weight change.  HENT: Negative for congestion, ear discharge, ear pain, facial swelling, hearing loss, mouth sores, nosebleeds, postnasal drip, rhinorrhea, sinus pressure, sneezing, sore throat, tinnitus, trouble swallowing and voice change.   Eyes: Negative for pain, discharge, redness and visual disturbance.  Respiratory: Positive for cough and shortness of breath. Negative for chest tightness, wheezing and stridor.   Cardiovascular: Negative for chest pain, palpitations and leg swelling.  Musculoskeletal: Negative for arthralgias, myalgias, neck pain and neck stiffness.  Skin: Negative for color change and rash.  Neurological: Negative for dizziness, weakness, light-headedness and headaches.  Hematological: Negative for adenopathy.       Objective:    BP 144/72  Pulse 70  Temp(Src) 98.2 F (36.8 C) (Oral)  Ht 4\' 11"  (1.499 m)  Wt 141 lb 4 oz (64.071 kg)  BMI 28.51 kg/m2  SpO2 95% Physical Exam  Constitutional: She is oriented to person, place, and time. She appears well-developed and well-nourished. No distress.  HENT:  Head: Normocephalic and atraumatic.  Right Ear: External ear normal.  Left Ear: External ear normal.  Nose: Nose normal.  Mouth/Throat: Oropharynx is clear and moist. No oropharyngeal exudate.  Eyes: Conjunctivae are normal. Pupils are equal, round, and reactive to light. Right eye exhibits no discharge. Left eye exhibits no discharge. No scleral  icterus.  Neck: Normal range of motion. Neck supple. No tracheal deviation present. No thyromegaly present.  Cardiovascular: Normal rate, regular rhythm, normal heart sounds and intact distal pulses.  Exam reveals no gallop and no friction rub.   No murmur heard. Pulmonary/Chest: Effort normal. No accessory muscle usage. Not tachypneic. No respiratory distress. She has no decreased breath sounds. She has no wheezes. She has rhonchi (scattered). She has no rales. She exhibits no tenderness.  Musculoskeletal: Normal range of motion. She exhibits no edema and no tenderness.  Lymphadenopathy:    She has no cervical adenopathy.  Neurological: She is alert and oriented to person, place, and time. No cranial nerve deficit. She exhibits normal muscle tone. Coordination normal.  Skin: Skin is warm and dry. No rash noted. She is not diaphoretic. No erythema. No pallor.  Psychiatric: She has a normal mood and affect. Her behavior is normal. Judgment and thought content normal.          Assessment & Plan:   Problem List Items Addressed This Visit     Unprioritized   Acute bronchitis - Primary     Symptoms and exam most consistent with acute bronchitis. Will start Azithromycin and use Tessalon as needed for cough. Encouraged rest, adequate fluids. Follow up if symptoms are not improving or in 3-4 weeks for recheck.    Relevant Medications      benzonatate (TESSALON) capsule      azithromycin (ZITHROMAX) tablet       Return in about 4 weeks (around 06/03/2014) for Recheck.

## 2014-05-06 NOTE — Telephone Encounter (Signed)
Could we work her in at 4:30pm today?

## 2014-05-07 ENCOUNTER — Ambulatory Visit: Payer: Medicare Other | Admitting: Internal Medicine

## 2014-06-09 ENCOUNTER — Ambulatory Visit: Payer: Medicare Other | Admitting: Internal Medicine

## 2014-06-10 ENCOUNTER — Other Ambulatory Visit: Payer: Self-pay | Admitting: Internal Medicine

## 2014-06-10 NOTE — Telephone Encounter (Signed)
Last OV 10.20.15, last refill 9.3.15.  Advise refill

## 2014-06-24 ENCOUNTER — Ambulatory Visit
Admission: RE | Admit: 2014-06-24 | Discharge: 2014-06-24 | Disposition: A | Discharge status: Home / Self Care | Payer: Medicare Other | Source: Ambulatory Visit | Attending: Internal Medicine | Admitting: Internal Medicine

## 2014-06-24 ENCOUNTER — Other Ambulatory Visit: Payer: Self-pay | Admitting: Internal Medicine

## 2014-06-24 ENCOUNTER — Ambulatory Visit: Payer: Medicare Other

## 2014-06-24 DIAGNOSIS — Z1231 Encounter for screening mammogram for malignant neoplasm of breast: Secondary | ICD-10-CM

## 2014-06-26 ENCOUNTER — Other Ambulatory Visit: Payer: Self-pay | Admitting: Internal Medicine

## 2014-06-26 DIAGNOSIS — R928 Other abnormal and inconclusive findings on diagnostic imaging of breast: Secondary | ICD-10-CM

## 2014-06-30 ENCOUNTER — Telehealth: Payer: Self-pay

## 2014-06-30 NOTE — Telephone Encounter (Signed)
The patient called and is hoping for a premarin medication.  She states [employer] mentioned she might need a prior authorization for this medication.

## 2014-07-01 MED ORDER — ESTROGENS CONJUGATED 0.45 MG PO TABS: 0.4500 mg | ORAL_TABLET | Freq: Every day | ORAL | Status: DC

## 2014-07-01 NOTE — Telephone Encounter (Signed)
Fine to refill 

## 2014-07-01 NOTE — Telephone Encounter (Signed)
Historical med.  Please advise refill.

## 2014-07-13 ENCOUNTER — Other Ambulatory Visit: Payer: Self-pay | Admitting: Internal Medicine

## 2014-07-14 NOTE — Telephone Encounter (Signed)
Phoned to pharmacy 

## 2014-07-14 NOTE — Telephone Encounter (Signed)
Ok refill? 

## 2014-07-15 ENCOUNTER — Other Ambulatory Visit: Payer: Self-pay | Admitting: Internal Medicine

## 2014-07-15 ENCOUNTER — Ambulatory Visit
Admission: RE | Admit: 2014-07-15 | Discharge: 2014-07-15 | Disposition: A | Discharge status: Home / Self Care | Payer: Medicare Other | Source: Ambulatory Visit | Attending: Internal Medicine | Admitting: Internal Medicine

## 2014-07-15 ENCOUNTER — Other Ambulatory Visit: Payer: Self-pay

## 2014-07-15 DIAGNOSIS — R928 Other abnormal and inconclusive findings on diagnostic imaging of breast: Secondary | ICD-10-CM

## 2014-07-15 LAB — HM MAMMOGRAPHY

## 2014-07-16 ENCOUNTER — Other Ambulatory Visit: Payer: Self-pay | Admitting: Internal Medicine

## 2014-07-24 ENCOUNTER — Ambulatory Visit: Payer: Medicare Other | Admitting: Internal Medicine

## 2014-07-27 ENCOUNTER — Other Ambulatory Visit: Payer: Self-pay | Admitting: Internal Medicine

## 2014-07-28 NOTE — Telephone Encounter (Signed)
Ok refill? 

## 2014-07-28 NOTE — Telephone Encounter (Signed)
Called to pharmacy 

## 2014-07-31 ENCOUNTER — Encounter: Payer: Self-pay | Admitting: Internal Medicine

## 2014-07-31 ENCOUNTER — Ambulatory Visit (INDEPENDENT_AMBULATORY_CARE_PROVIDER_SITE_OTHER): Payer: Medicare Other | Admitting: Internal Medicine

## 2014-07-31 VITALS — BP 131/70 | HR 73 | Temp 97.9°F | Ht 59.75 in | Wt 141.2 lb

## 2014-07-31 DIAGNOSIS — I10 Essential (primary) hypertension: Secondary | ICD-10-CM

## 2014-07-31 DIAGNOSIS — Z0000 Encounter for general adult medical examination without abnormal findings: Secondary | ICD-10-CM

## 2014-07-31 DIAGNOSIS — G4700 Insomnia, unspecified: Secondary | ICD-10-CM

## 2014-07-31 LAB — CBC WITH DIFFERENTIAL/PLATELET
BASOS PCT: 0.8 % (ref 0.0–3.0)
Basophils Absolute: 0.1 10*3/uL (ref 0.0–0.1)
Eosinophils Absolute: 0.1 10*3/uL (ref 0.0–0.7)
Eosinophils Relative: 1 % (ref 0.0–5.0)
HCT: 36.2 % (ref 36.0–46.0)
HEMOGLOBIN: 11.8 g/dL — AB (ref 12.0–15.0)
LYMPHS ABS: 2.5 10*3/uL (ref 0.7–4.0)
Lymphocytes Relative: 39.3 % (ref 12.0–46.0)
MCHC: 32.6 g/dL (ref 30.0–36.0)
MCV: 86.8 fl (ref 78.0–100.0)
MONO ABS: 0.5 10*3/uL (ref 0.1–1.0)
Monocytes Relative: 7.7 % (ref 3.0–12.0)
NEUTROS PCT: 51.2 % (ref 43.0–77.0)
Neutro Abs: 3.2 10*3/uL (ref 1.4–7.7)
PLATELETS: 224 10*3/uL (ref 150.0–400.0)
RBC: 4.18 Mil/uL (ref 3.87–5.11)
RDW: 14 % (ref 11.5–15.5)
WBC: 6.3 10*3/uL (ref 4.0–10.5)

## 2014-07-31 LAB — COMPREHENSIVE METABOLIC PANEL
ALK PHOS: 58 U/L (ref 39–117)
ALT: 18 U/L (ref 0–35)
AST: 25 U/L (ref 0–37)
Albumin: 4.2 g/dL (ref 3.5–5.2)
BILIRUBIN TOTAL: 0.3 mg/dL (ref 0.2–1.2)
BUN: 16 mg/dL (ref 6–23)
CALCIUM: 9.2 mg/dL (ref 8.4–10.5)
CO2: 28 mEq/L (ref 19–32)
Chloride: 102 mEq/L (ref 96–112)
Creatinine, Ser: 0.87 mg/dL (ref 0.40–1.20)
GFR: 66.56 mL/min (ref >60.00)
GLUCOSE: 120 mg/dL — AB (ref 70–99)
Potassium: 3.9 mEq/L (ref 3.5–5.1)
Sodium: 139 mEq/L (ref 135–145)
TOTAL PROTEIN: 7.5 g/dL (ref 6.0–8.3)

## 2014-07-31 LAB — LIPID PANEL
CHOL/HDL RATIO: 3
Cholesterol: 199 mg/dL (ref 0–200)
HDL: 74 mg/dL (ref >39.00)
LDL CALC: 94 mg/dL (ref 0–99)
NONHDL: 125
TRIGLYCERIDES: 155 mg/dL — AB (ref 0.0–149.0)
VLDL: 31 mg/dL (ref 0.0–40.0)

## 2014-07-31 LAB — MICROALBUMIN / CREATININE URINE RATIO
Creatinine,U: 37.4 mg/dL
MICROALB/CREAT RATIO: 1.9 mg/g (ref 0.0–30.0)
Microalb, Ur: <0.7 mg/dL (ref 0.0–1.9)

## 2014-07-31 MED ORDER — ESOMEPRAZOLE MAGNESIUM 40 MG PO CPDR: DELAYED_RELEASE_CAPSULE | ORAL | Status: DC

## 2014-07-31 MED ORDER — HYDROXYCHLOROQUINE SULFATE 200 MG PO TABS: 200.0000 mg | ORAL_TABLET | Freq: Two times a day (BID) | ORAL | Status: DC

## 2014-07-31 MED ORDER — PREGABALIN 150 MG PO CAPS: 150.0000 mg | ORAL_CAPSULE | Freq: Two times a day (BID) | ORAL | Status: DC

## 2014-07-31 MED ORDER — ESTROGENS CONJUGATED 0.45 MG PO TABS: 0.4500 mg | ORAL_TABLET | Freq: Every day | ORAL | Status: DC

## 2014-07-31 MED ORDER — TRAZODONE HCL 50 MG PO TABS: 25.0000 mg | ORAL_TABLET | Freq: Every evening | ORAL | Status: DC | PRN

## 2014-07-31 NOTE — Assessment & Plan Note (Signed)
Will stop Lunesta and start Trazodone, as Lunesta not covered by insurance. She will call with update next week.

## 2014-07-31 NOTE — Assessment & Plan Note (Signed)
General medical exam normal today including breast exam. Pelvic exam deferred given last PAP 2014 was normal. Mammogram UTD and reviewed, repeat in 12/2014. Colonoscopy UTD. Immunizations UTD, plan next Pneumovax in 01/2015. Labs today including CBC, CMP, lipids, Vit D. Encouraged healthy diet and exercise.

## 2014-07-31 NOTE — Patient Instructions (Addendum)
STOP Lunesta and start Trazodone $RemoveBeforeD'25mg'foPeGmNogKtnnA$ -$RemoveB'50mg'bddWHzQC$  at bedtime.  Health Maintenance Adopting a healthy lifestyle and getting preventive care can go a long way to promote health and wellness. Talk with your health care provider about what schedule of regular examinations is right for you. This is a good chance for you to check in with your provider about disease prevention and staying healthy. In between checkups, there are plenty of things you can do on your own. Experts have done a lot of research about which lifestyle changes and preventive measures are most likely to keep you healthy. Ask your health care provider for more information. WEIGHT AND DIET  Eat a healthy diet  Be sure to include plenty of vegetables, fruits, low-fat dairy products, and lean protein.  Do not eat a lot of foods high in solid fats, added sugars, or salt.  Get regular exercise. This is one of the most important things you can do for your health.  Most adults should exercise for at least 150 minutes each week. The exercise should increase your heart rate and make you sweat (moderate-intensity exercise).  Most adults should also do strengthening exercises at least twice a week. This is in addition to the moderate-intensity exercise.  Maintain a healthy weight  Body mass index (BMI) is a measurement that can be used to identify possible weight problems. It estimates body fat based on height and weight. Your health care provider can help determine your BMI and help you achieve or maintain a healthy weight.  For females 39 years of age and older:   A BMI below 18.5 is considered underweight.  A BMI of 18.5 to 24.9 is normal.  A BMI of 25 to 29.9 is considered overweight.  A BMI of 30 and above is considered obese.  Watch levels of cholesterol and blood lipids  You should start having your blood tested for lipids and cholesterol at 79 years of age, then have this test every 5 years.  You may need to have your  cholesterol levels checked more often if:  Your lipid or cholesterol levels are high.  You are older than 79 years of age.  You are at high risk for heart disease.  CANCER SCREENING   Lung Cancer  Lung cancer screening is recommended for adults 110-57 years old who are at high risk for lung cancer because of a history of smoking.  A yearly low-dose CT scan of the lungs is recommended for people who:  Currently smoke.  Have quit within the past 15 years.  Have at least a 30-pack-year history of smoking. A pack year is smoking an average of one pack of cigarettes a day for 1 year.  Yearly screening should continue until it has been 15 years since you quit.  Yearly screening should stop if you develop a health problem that would prevent you from having lung cancer treatment.  Breast Cancer  Practice breast self-awareness. This means understanding how your breasts normally appear and feel.  It also means doing regular breast self-exams. Let your health care provider know about any changes, no matter how small.  If you are in your 20s or 30s, you should have a clinical breast exam (CBE) by a health care provider every 1-3 years as part of a regular health exam.  If you are 10 or older, have a CBE every year. Also consider having a breast X-ray (mammogram) every year.  If you have a family history of breast cancer, talk to your health  care provider about genetic screening.  If you are at high risk for breast cancer, talk to your health care provider about having an MRI and a mammogram every year.  Breast cancer gene (BRCA) assessment is recommended for women who have family members with BRCA-related cancers. BRCA-related cancers include:  Breast.  Ovarian.  Tubal.  Peritoneal cancers.  Results of the assessment will determine the need for genetic counseling and BRCA1 and BRCA2 testing. Cervical Cancer Routine pelvic examinations to screen for cervical cancer are no  longer recommended for nonpregnant women who are considered low risk for cancer of the pelvic organs (ovaries, uterus, and vagina) and who do not have symptoms. A pelvic examination may be necessary if you have symptoms including those associated with pelvic infections. Ask your health care provider if a screening pelvic exam is right for you.   The Pap test is the screening test for cervical cancer for women who are considered at risk.  If you had a hysterectomy for a problem that was not cancer or a condition that could lead to cancer, then you no longer need Pap tests.  If you are older than 65 years, and you have had normal Pap tests for the past 10 years, you no longer need to have Pap tests.  If you have had past treatment for cervical cancer or a condition that could lead to cancer, you need Pap tests and screening for cancer for at least 20 years after your treatment.  If you no longer get a Pap test, assess your risk factors if they change (such as having a new sexual partner). This can affect whether you should start being screened again.  Some women have medical problems that increase their chance of getting cervical cancer. If this is the case for you, your health care provider may recommend more frequent screening and Pap tests.  The human papillomavirus (HPV) test is another test that may be used for cervical cancer screening. The HPV test looks for the virus that can cause cell changes in the cervix. The cells collected during the Pap test can be tested for HPV.  The HPV test can be used to screen women 26 years of age and older. Getting tested for HPV can extend the interval between normal Pap tests from three to five years.  An HPV test also should be used to screen women of any age who have unclear Pap test results.  After 79 years of age, women should have HPV testing as often as Pap tests.  Colorectal Cancer  This type of cancer can be detected and often  prevented.  Routine colorectal cancer screening usually begins at 79 years of age and continues through 79 years of age.  Your health care provider may recommend screening at an earlier age if you have risk factors for colon cancer.  Your health care provider may also recommend using home test kits to check for hidden blood in the stool.  A small camera at the end of a tube can be used to examine your colon directly (sigmoidoscopy or colonoscopy). This is done to check for the earliest forms of colorectal cancer.  Routine screening usually begins at age 65.  Direct examination of the colon should be repeated every 5-10 years through 79 years of age. However, you may need to be screened more often if early forms of precancerous polyps or small growths are found. Skin Cancer  Check your skin from head to toe regularly.  Tell your health  care provider about any new moles or changes in moles, especially if there is a change in a mole's shape or color.  Also tell your health care provider if you have a mole that is larger than the size of a pencil eraser.  Always use sunscreen. Apply sunscreen liberally and repeatedly throughout the day.  Protect yourself by wearing long sleeves, pants, a wide-brimmed hat, and sunglasses whenever you are outside. HEART DISEASE, DIABETES, AND HIGH BLOOD PRESSURE   Have your blood pressure checked at least every 1-2 years. High blood pressure causes heart disease and increases the risk of stroke.  If you are between 78 years and 37 years old, ask your health care provider if you should take aspirin to prevent strokes.  Have regular diabetes screenings. This involves taking a blood sample to check your fasting blood sugar level.  If you are at a normal weight and have a low risk for diabetes, have this test once every three years after 79 years of age.  If you are overweight and have a high risk for diabetes, consider being tested at a younger age or more  often. PREVENTING INFECTION  Hepatitis B  If you have a higher risk for hepatitis B, you should be screened for this virus. You are considered at high risk for hepatitis B if:  You were born in a country where hepatitis B is common. Ask your health care provider which countries are considered high risk.  Your parents were born in a high-risk country, and you have not been immunized against hepatitis B (hepatitis B vaccine).  You have HIV or AIDS.  You use needles to inject street drugs.  You live with someone who has hepatitis B.  You have had sex with someone who has hepatitis B.  You get hemodialysis treatment.  You take certain medicines for conditions, including cancer, organ transplantation, and autoimmune conditions. Hepatitis C  Blood testing is recommended for:  Everyone born from [birth year] through 1963.  Anyone with known risk factors for hepatitis C. Sexually transmitted infections (STIs)  You should be screened for sexually transmitted infections (STIs) including gonorrhea and chlamydia if:  You are sexually active and are younger than 79 years of age.  You are older than 79 years of age and your health care provider tells you that you are at risk for this type of infection.  Your sexual activity has changed since you were last screened and you are at an increased risk for chlamydia or gonorrhea. Ask your health care provider if you are at risk.  If you do not have HIV, but are at risk, it may be recommended that you take a prescription medicine daily to prevent HIV infection. This is called pre-exposure prophylaxis (PrEP). You are considered at risk if:  You are sexually active and do not regularly use condoms or know the HIV status of your partner(s).  You take drugs by injection.  You are sexually active with a partner who has HIV. Talk with your health care provider about whether you are at high risk of being infected with HIV. If you choose to begin PrEP, you  should first be tested for HIV. You should then be tested every 3 months for as long as you are taking PrEP.  PREGNANCY   If you are premenopausal and you may become pregnant, ask your health care provider about preconception counseling.  If you may become pregnant, take 400 to 800 micrograms (mcg) of folic acid every day.  If you want to prevent pregnancy, talk to your health care provider about birth control (contraception). OSTEOPOROSIS AND MENOPAUSE   Osteoporosis is a disease in which the bones lose minerals and strength with aging. This can result in serious bone fractures. Your risk for osteoporosis can be identified using a bone density scan.  If you are 7 years of age or older, or if you are at risk for osteoporosis and fractures, ask your health care provider if you should be screened.  Ask your health care provider whether you should take a calcium or vitamin D supplement to lower your risk for osteoporosis.  Menopause may have certain physical symptoms and risks.  Hormone replacement therapy may reduce some of these symptoms and risks. Talk to your health care provider about whether hormone replacement therapy is right for you.  HOME CARE INSTRUCTIONS   Schedule regular health, dental, and eye exams.  Stay current with your immunizations.   Do not use any tobacco products including cigarettes, chewing tobacco, or electronic cigarettes.  If you are pregnant, do not drink alcohol.  If you are breastfeeding, limit how much and how often you drink alcohol.  Limit alcohol intake to no more than 1 drink per day for nonpregnant women. One drink equals 12 ounces of beer, 5 ounces of wine, or 1 ounces of hard liquor.  Do not use street drugs.  Do not share needles.  Ask your health care provider for help if you need support or information about quitting drugs.  Tell your health care provider if you often feel depressed.  Tell your health care provider if you have ever  been abused or do not feel safe at home. Document Released: 01/17/2011 Document Revised: 11/18/2013 Document Reviewed: 06/05/2013 [facility] Patient Information 2015 [place], [place]. This information is not intended to replace advice given to you by your health care provider. Make sure you discuss any questions you have with your health care provider.

## 2014-07-31 NOTE — Progress Notes (Signed)
Subjective:    Patient ID: Sandy Merritt, female    DOB: August 25, 1931, 81 y.o.   MRN: [id number]  HPI  The patient is here for annual Medicare wellness examination and management of other chronic and acute problems.   The risk factors are reflected in the social history.  The roster of all physicians providing medical care to patient - is listed in the Snapshot section of the chart.  Activities of daily living:  The patient is 100% independent in all ADLs: dressing, toileting, feeding as well as independent mobility. Lives alone. No pets in home.  Home safety : The patient has smoke detectors in the home. Has home security system. They wear seatbelts.  There are no firearms at home. There is no violence in the home.   There is no risks for hepatitis, STDs or HIV. There is no history of blood transfusion. They have no travel history to infectious disease endemic areas of the world.  The patient has not seen their dentist in the last six month. Dentist - unsure They have seen their eye doctor in the last year. Opthalmologist - Dr. Linton Flemings They have deferred audiologic testing in the last year.   They do not  have excessive sun exposure. Discussed the need for sun protection: hats, long sleeves and use of sunscreen if there is significant sun exposure. Dermatologist - Dr. Nevada Crane GYN - Dr. Helane Rima GI - Dr. Johna Roles Rheum - Dr. Corena Pilgrim  Diet: the importance of a healthy diet is discussed. They do have a healthy diet.  The benefits of regular aerobic exercise were discussed. She exercises by walking.  Works as Psychologist, occupational at [employer].  Depression screen: there are no signs or vegative symptoms of depression- irritability, change in appetite, anhedonia, sadness/tearfullness.  Cognitive assessment: the patient manages all their financial and personal affairs and is actively engaged. They could relate day,date,year and events.  HCPOA - daughter, Lucile Shutters  The following portions of the  patient's history were reviewed and updated as appropriate: allergies, current medications, past family history, past medical history,  past surgical history, past social history  and problem list.  Visual acuity was not assessed per patient preference since she has regular follow up with her ophthalmologist. Hearing and body mass index were assessed and reviewed.   During the course of the visit the patient was educated and counseled about appropriate screening and preventive services including : fall prevention , diabetes screening, nutrition counseling, colorectal cancer screening, and recommended immunizations.     Past medical, surgical, family and social history per today's encounter.  Review of Systems  Constitutional: Negative for fever, chills, appetite change, fatigue and unexpected weight change.  Eyes: Negative for visual disturbance.  Respiratory: Negative for shortness of breath.   Cardiovascular: Negative for chest pain and leg swelling.  Gastrointestinal: Negative for nausea, vomiting, abdominal pain, diarrhea and constipation.  Skin: Negative for color change and rash.  Hematological: Negative for adenopathy. Does not bruise/bleed easily.  Psychiatric/Behavioral: Positive for sleep disturbance. Negative for dysphoric mood. The patient is not nervous/anxious.        Objective:    BP 131/70 mmHg  Pulse 73  Temp(Src) 97.9 F (36.6 C) (Oral)  Ht 4' 11.75" (1.518 m)  Wt 141 lb 4 oz (64.071 kg)  BMI 27.80 kg/m2  SpO2 98% Physical Exam  Constitutional: She is oriented to person, place, and time. She appears well-developed and well-nourished. No distress.  HENT:  Head: Normocephalic and atraumatic.  Right Ear: External  ear normal.  Left Ear: External ear normal.  Nose: Nose normal.  Mouth/Throat: Oropharynx is clear and moist. No oropharyngeal exudate.  Eyes: Conjunctivae are normal. Pupils are equal, round, and reactive to light. Right eye exhibits no discharge. Left  eye exhibits no discharge. No scleral icterus.  Neck: Normal range of motion. Neck supple. No tracheal deviation present. No thyromegaly present.  Cardiovascular: Normal rate, regular rhythm, normal heart sounds and intact distal pulses.  Exam reveals no gallop and no friction rub.   No murmur heard. Pulmonary/Chest: Effort normal and breath sounds normal. No accessory muscle usage. No tachypnea. No respiratory distress. She has no decreased breath sounds. She has no wheezes. She has no rales. She exhibits no tenderness. Right breast exhibits no inverted nipple, no mass, no nipple discharge, no skin change and no tenderness. Left breast exhibits no inverted nipple, no mass, no nipple discharge, no skin change and no tenderness. Breasts are symmetrical.  Abdominal: Soft. Bowel sounds are normal. She exhibits no distension and no mass. There is no tenderness. There is no rebound and no guarding.  Musculoskeletal: Normal range of motion. She exhibits no edema or tenderness.  Lymphadenopathy:    She has no cervical adenopathy.  Neurological: She is alert and oriented to person, place, and time. No cranial nerve deficit. She exhibits normal muscle tone. Coordination normal.  Skin: Skin is warm and dry. No rash noted. She is not diaphoretic. No erythema. No pallor.  Psychiatric: She has a normal mood and affect. Her behavior is normal. Judgment and thought content normal.          Assessment & Plan:   Problem List Items Addressed This Visit      Unprioritized   Insomnia    Will stop Lunesta and start Trazodone, as Lunesta not covered by insurance. She will call with update next week.      Relevant Medications   traZODone (DESYREL) tablet   Medicare annual wellness visit, subsequent - Primary    General medical exam normal today including breast exam. Pelvic exam deferred given last PAP 2014 was normal. Mammogram UTD and reviewed, repeat in 12/2014. Colonoscopy UTD. Immunizations UTD, plan next  Pneumovax in 01/2015. Labs today including CBC, CMP, lipids, Vit D. Encouraged healthy diet and exercise.      Relevant Orders   CBC with Differential   Comprehensive metabolic panel   Lipid panel   Microalbumin / creatinine urine ratio   Vit D  25 hydroxy (rtn osteoporosis monitoring)       Return in about 3 months (around 10/30/2014) for Recheck.

## 2014-08-01 LAB — VITAMIN D 25 HYDROXY (VIT D DEFICIENCY, FRACTURES): VITD: 34.78 ng/mL (ref 30.00–100.00)

## 2014-08-05 ENCOUNTER — Telehealth: Payer: Self-pay | Admitting: Internal Medicine

## 2014-08-05 NOTE — Telephone Encounter (Signed)
The patient said that [facility] sent her a letter saying that they need additional information from her doctor within 14 days of original request to approve the ESZOPICLONE. Please call [place] at [phone].

## 2014-08-05 NOTE — Telephone Encounter (Signed)
Spoke with pt, Pt states that she has never tried any other medication.  Asked her if she has tried the Trazodone that was prescribed to her last week, Pt stated that she has not.  Pt seemed hesitant to trying the new medication.  Asked her to try it for a week and give me a call next week with an update, as the insurance company is going to require her to try other medications. Pt agreed and will call me next week.

## 2014-08-05 NOTE — Telephone Encounter (Signed)
They will need documentation that she failed another medication.  Please ask her which other medications she has tried and failed. They will not approve the PA if she hasn't tried formulary meds.

## 2014-08-19 ENCOUNTER — Encounter: Payer: Self-pay | Admitting: Internal Medicine

## 2014-08-19 ENCOUNTER — Ambulatory Visit (INDEPENDENT_AMBULATORY_CARE_PROVIDER_SITE_OTHER): Payer: Medicare Other | Admitting: Internal Medicine

## 2014-08-19 VITALS — BP 140/79 | HR 70 | Temp 97.7°F | Ht 59.75 in | Wt 141.0 lb

## 2014-08-19 DIAGNOSIS — G4700 Insomnia, unspecified: Secondary | ICD-10-CM

## 2014-08-19 MED ORDER — ESZOPICLONE 3 MG PO TABS: 3.0000 mg | ORAL_TABLET | Freq: Every day | ORAL | Status: DC

## 2014-08-19 NOTE — Progress Notes (Signed)
Pre visit review using our clinic review tool, if applicable. No additional management support is needed unless otherwise documented below in the visit note. 

## 2014-08-19 NOTE — Patient Instructions (Signed)
We will submit a new request for Lunesta.

## 2014-08-19 NOTE — Progress Notes (Signed)
   Subjective:    Patient ID: Sandy Merritt, female    DOB: Sep 06, 1931, 81 y.o.   MRN: [id number]  HPI 81YO female presents for follow up.  Started Trazodone to help with insomnia. Unable to fall asleep until midnight. Feeling tired during day. Frustrated by inability to sleep.  Past medical, surgical, family and social history per today's encounter.  Review of Systems  Constitutional: Positive for fatigue. Negative for fever, chills, appetite change and unexpected weight change.  Eyes: Negative for visual disturbance.  Respiratory: Negative for shortness of breath.   Cardiovascular: Negative for chest pain and leg swelling.  Gastrointestinal: Negative for abdominal pain.  Skin: Negative for color change and rash.  Hematological: Negative for adenopathy. Does not bruise/bleed easily.  Psychiatric/Behavioral: Positive for sleep disturbance. Negative for dysphoric mood. The patient is not nervous/anxious.        Objective:    BP 140/79 mmHg  Pulse 70  Temp(Src) 97.7 F (36.5 C) (Oral)  Ht 4' 11.75" (1.518 m)  Wt 141 lb (63.957 kg)  BMI 27.76 kg/m2  SpO2 99% Physical Exam  Constitutional: She is oriented to person, place, and time. She appears well-developed and well-nourished. No distress.  HENT:  Head: Normocephalic and atraumatic.  Right Ear: External ear normal.  Left Ear: External ear normal.  Nose: Nose normal.  Mouth/Throat: Oropharynx is clear and moist. No oropharyngeal exudate.  Eyes: Conjunctivae are normal. Pupils are equal, round, and reactive to light. Right eye exhibits no discharge. Left eye exhibits no discharge. No scleral icterus.  Neck: Normal range of motion. Neck supple. No tracheal deviation present. No thyromegaly present.  Cardiovascular: Normal rate, regular rhythm, normal heart sounds and intact distal pulses.  Exam reveals no gallop and no friction rub.   No murmur heard. Pulmonary/Chest: Effort normal and breath sounds normal. No respiratory  distress. She has no wheezes. She has no rales. She exhibits no tenderness.  Musculoskeletal: Normal range of motion. She exhibits no edema or tenderness.  Lymphadenopathy:    She has no cervical adenopathy.  Neurological: She is alert and oriented to person, place, and time. No cranial nerve deficit. She exhibits normal muscle tone. Coordination normal.  Skin: Skin is warm and dry. No rash noted. She is not diaphoretic. No erythema. No pallor.  Psychiatric: She has a normal mood and affect. Her behavior is normal. Judgment and thought content normal.          Assessment & Plan:   Problem List Items Addressed This Visit    None       No Follow-up on file.

## 2014-08-19 NOTE — Assessment & Plan Note (Signed)
Worsening symptoms of insomnia and fatigue on Trazodone. Will again try to get Lunesta approved as this worked so well for her in the past.

## 2014-09-18 ENCOUNTER — Emergency Department: Payer: Self-pay | Admitting: Emergency Medicine

## 2014-09-18 ENCOUNTER — Telehealth: Payer: Self-pay | Admitting: *Deleted

## 2014-09-18 NOTE — Telephone Encounter (Signed)
Patient stated she is sob  She is having severe left side back and chest pain  It is worse when she takes a breath  She feels like she is having to gasp for air at times  She was unable to describe pain  She stated she just wanted to make an appointment for next week because her children are coming in town   I instructed patient to go to the ED now  She refused and stated she could not be stuck in the hospital when her children were coming to visit  She stated she will go to urgent care right now  I instructed her to please call me and let me know that she went   I instructed patient to contact EMS if her situation becomes emergent  Patient verbalized understanding

## 2014-09-18 NOTE — Telephone Encounter (Signed)
Pt is having chest pains, all in left side, in the back.  "Does not think it will kill her right now" but does not want to get it any worst Tries to breath, and it hurts.  Pt c/o of Chest Pain: STAT if CP now or developed within 24 hours  1. Are you having CP right now? Only when she takes a deep breath  2. Are you experiencing any other symptoms (ex. SOB, nausea, vomiting, sweating)? No just hurts   3. How long have you been experiencing CP? 3 to 4 days  4. Is your CP continuous or coming and going? Mostly when she takes a deep breath.   5. Have you taken Nitroglycerin? No, never ?

## 2014-09-21 ENCOUNTER — Other Ambulatory Visit: Payer: Self-pay | Admitting: Internal Medicine

## 2014-10-07 ENCOUNTER — Telehealth: Payer: Self-pay | Admitting: *Deleted

## 2014-10-07 NOTE — Telephone Encounter (Signed)
OK. Fine to have 52min evaluation tomorrow.

## 2014-10-07 NOTE — Telephone Encounter (Signed)
Pt states that this morning when she woke up her legs and feet were numb, she began to get out of the bed thinking that standing would help them "wake up", which resulted in her falling and hitting her head on some books on a table, she tried to stand again and fell hitting her head on the light switch.  No nausea, vomiting or any lacerations  .She is concerned about why her legs were numb.

## 2014-10-08 ENCOUNTER — Ambulatory Visit (INDEPENDENT_AMBULATORY_CARE_PROVIDER_SITE_OTHER): Payer: Medicare Other | Admitting: Internal Medicine

## 2014-10-08 ENCOUNTER — Encounter: Payer: Self-pay | Admitting: Internal Medicine

## 2014-10-08 VITALS — BP 139/81 | HR 80 | Temp 97.5°F | Ht 59.75 in | Wt 138.2 lb

## 2014-10-08 DIAGNOSIS — R42 Dizziness and giddiness: Secondary | ICD-10-CM | POA: Diagnosis not present

## 2014-10-08 MED ORDER — MECLIZINE HCL 25 MG PO TABS: 25.0000 mg | ORAL_TABLET | Freq: Three times a day (TID) | ORAL | Status: DC | PRN

## 2014-10-08 NOTE — Progress Notes (Signed)
Subjective:    Patient ID: Sandy Merritt, female    DOB: 10/25/1931, 81 y.o.   MRN: [id number]  HPI  81YO female presents for acute visit.  Dizziness - Started feeling dizzy on Sunday. Monday morning, woke up at 4am to use restroom. Was unable to move legs. Legs and feet were numb. Slid off bed and onto floor. Could not stand. After a few minutes, was able to stand, but hit head on table when trying to stand up. Also hit head on wall when standing. No LOC. After a few min, was able to stand to take a shower. Legs were fine after that. 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 Felt some dizziness during that time. Mild nausea, no vomiting. Occasional headaches which are chronic. Feeling tired. No chest pain, dyspnea.  Has had episodes of vertigo in the past.  Past medical, surgical, family and social history per today's encounter.  Review of Systems  Constitutional: Negative for fever, chills, appetite change, fatigue and unexpected weight change.  Eyes: Negative for visual disturbance.  Respiratory: Negative for shortness of breath.   Cardiovascular: Negative for chest pain and leg swelling.  Gastrointestinal: Positive for nausea. Negative for abdominal pain, diarrhea, constipation and blood in stool.  Musculoskeletal: Negative for myalgias and arthralgias.  Skin: Negative for color change and rash.  Neurological: Positive for dizziness, weakness, numbness and headaches. Negative for tremors, seizures, syncope, facial asymmetry and light-headedness.  Hematological: Negative for adenopathy. Does not bruise/bleed easily.  Psychiatric/Behavioral: Negative for dysphoric mood. The patient is not nervous/anxious.        Objective:    BP 139/81 mmHg  Pulse 80  Temp(Src) 97.5 F (36.4 C) (Oral)  Ht 4' 11.75" (1.518 m)  Wt 138 lb 4 oz (62.71 kg)  BMI 27.21 kg/m2  SpO2 100% Physical Exam  Constitutional: She is oriented to person, place, and time. She  appears well-developed and well-nourished. No distress.  HENT:  Head: Normocephalic and atraumatic.    Right Ear: Tympanic membrane and external ear normal.  Left Ear: Tympanic membrane and external ear normal.  Nose: Nose normal.  Mouth/Throat: Oropharynx is clear and moist. No oropharyngeal exudate.  Eyes: Conjunctivae are normal. Pupils are equal, round, and reactive to light. Right eye exhibits no discharge. Left eye exhibits no discharge. No scleral icterus.  Neck: Normal range of motion. Neck supple. No tracheal deviation present. No thyromegaly present.  Cardiovascular: Normal rate, regular rhythm, normal heart sounds and intact distal pulses.  Exam reveals no gallop and no friction rub.   No murmur heard. Pulmonary/Chest: Effort normal and breath sounds normal. No respiratory distress. She has no wheezes. She has no rales. She exhibits no tenderness.  Musculoskeletal: Normal range of motion. She exhibits no edema or tenderness.  Lymphadenopathy:    She has no cervical adenopathy.  Neurological: She is alert and oriented to person, place, and time. No cranial nerve deficit. She exhibits normal muscle tone. Coordination normal.  Skin: Skin is warm and dry. No rash noted. She is not diaphoretic. No erythema. No pallor.  Psychiatric: She has a normal mood and affect. Her behavior is normal. Judgment and thought content normal.          Assessment & Plan:  Over 56min of which >50% spent in face-to-face contact with patient discussing plan of care  Problem List Items Addressed This Visit      Unprioritized   Vertigo - Primary    Recent episode  of vertigo. Similar to previous event in 02/2014. No improvement that time with HallPike maneuver and PT. Reviewed CT head and MRI brain from that time, which were normal. Discussed repeating head CT given head injury. She would like to hold off for now. Risk of ICH low, given mechanism of injury, no LOC, and no persistent or severe headache  or nausea. Will start Meclizine to help control symptoms. Follow up tomorrow.      Relevant Medications   meclizine (ANTIVERT) tablet       Return in about 1 day (around 10/09/2014) for Recheck.

## 2014-10-08 NOTE — Progress Notes (Signed)
Pre visit review using our clinic review tool, if applicable. No additional management support is needed unless otherwise documented below in the visit note. 

## 2014-10-08 NOTE — Patient Instructions (Signed)
Start Meclizine 25mg  up to three times daily for dizziness.  Increase fluid intake as much as possible.  Follow up tomorrow or sooner if symptoms worsening.

## 2014-10-08 NOTE — Assessment & Plan Note (Addendum)
Recent episode of vertigo. Similar to previous event in 02/2014. No improvement that time with HallPike maneuver and PT. Reviewed CT head and MRI brain from that time, which were normal. Discussed repeating head CT given head injury. She would like to hold off for now. Risk of ICH low, given mechanism of injury, no LOC, and no persistent or severe headache or nausea. Will start Meclizine to help control symptoms. Follow up tomorrow.

## 2014-10-09 ENCOUNTER — Ambulatory Visit: Payer: Medicare Other | Admitting: Internal Medicine

## 2014-10-09 ENCOUNTER — Encounter: Payer: Self-pay | Admitting: Internal Medicine

## 2014-10-09 ENCOUNTER — Ambulatory Visit (INDEPENDENT_AMBULATORY_CARE_PROVIDER_SITE_OTHER): Payer: Medicare Other | Admitting: Internal Medicine

## 2014-10-09 VITALS — BP 123/77 | HR 69 | Temp 97.8°F | Ht 59.75 in | Wt 138.4 lb

## 2014-10-09 DIAGNOSIS — R42 Dizziness and giddiness: Secondary | ICD-10-CM

## 2014-10-09 NOTE — Patient Instructions (Signed)
Continue Meclizine.  Continue increased fluid intake.

## 2014-10-09 NOTE — Progress Notes (Signed)
   Subjective:    Patient ID: Sandy [place], female    DOB: [birth year], 81 y.o.   MRN: [id number]  HPI 81YO female presents for follow up.  Last seen yesterday for dizziness and fall at home.  Dizziness has improved. No further nausea. Feels tired, but this is improving.[place] starting Meclizine with some improvement in dizziness.. No new concerns today. Feeling more like herself. Tolerating a full diet. Trying to increase fluid intake.   Past medical, surgical, family and social history per today's encounter.  Review of Systems  Constitutional: Positive for fatigue. Negative for fever, chills, appetite change and unexpected weight change.  Eyes: Negative for visual disturbance.  Respiratory: Negative for shortness of breath.   Cardiovascular: Negative for chest pain and leg swelling.  Gastrointestinal: Negative for nausea, vomiting, abdominal pain and diarrhea.  Skin: Negative for color change and rash.  Neurological: Positive for dizziness. Negative for syncope, weakness, numbness and headaches.  Hematological: Negative for adenopathy. Does not bruise/bleed easily.  Psychiatric/Behavioral: Negative for dysphoric mood. The patient is not nervous/anxious.        Objective:    BP 123/77 mmHg  Pulse 69  Temp(Src) 97.8 F (36.6 C) (Oral)  Ht 4' 11.75" (1.518 m)  Wt 138 lb 6 oz (62.766 kg)  BMI 27.24 kg/m2  SpO2 97% Physical Exam  Constitutional: She is oriented to person, place, and time. She appears well-developed and well-nourished. No distress.  HENT:  Head: Normocephalic and atraumatic.  Right Ear: External ear normal.  Left Ear: External ear normal.  Nose: Nose normal.  Mouth/Throat: Oropharynx is clear and moist. No oropharyngeal exudate.  Eyes: Conjunctivae are normal. Pupils are equal, round, and reactive to light. Right eye exhibits no discharge. Left eye exhibits no discharge. No scleral icterus.  Neck: Normal range of motion. Neck supple. No tracheal  deviation present. No thyromegaly present.  Cardiovascular: Normal rate, regular rhythm, normal heart sounds and intact distal pulses.  Exam reveals no gallop and no friction rub.   No murmur heard. Pulmonary/Chest: Effort normal and breath sounds normal. No respiratory distress. She has no wheezes. She has no rales. She exhibits no tenderness.  Musculoskeletal: Normal range of motion. She exhibits no edema or tenderness.  Lymphadenopathy:    She has no cervical adenopathy.  Neurological: She is alert and oriented to person, place, and time. No cranial nerve deficit. She exhibits normal muscle tone. Coordination normal.  Skin: Skin is warm and dry. No rash noted. She is not diaphoretic. No erythema. No pallor.  Psychiatric: She has a normal mood and affect. Her behavior is normal. Judgment and thought content normal.          Assessment & Plan:   Problem List Items Addressed This Visit      Unprioritized   Vertigo - Primary    Symptoms are improving. Suspect BPV. Will continue prn Meclizine. Continue increased fluid intake. Follow up prn and in 4 weeks.          Return in about 4 weeks (around 11/06/2014) for Recheck.

## 2014-10-09 NOTE — Assessment & Plan Note (Signed)
Symptoms are improving. Suspect BPV. Will continue prn Meclizine. Continue increased fluid intake. Follow up prn and in 4 weeks.

## 2014-10-09 NOTE — Progress Notes (Signed)
Pre visit review using our clinic review tool, if applicable. No additional management support is needed unless otherwise documented below in the visit note. 

## 2014-10-28 ENCOUNTER — Encounter: Payer: Self-pay | Admitting: Internal Medicine

## 2014-10-28 ENCOUNTER — Ambulatory Visit (INDEPENDENT_AMBULATORY_CARE_PROVIDER_SITE_OTHER): Payer: Medicare Other | Admitting: Internal Medicine

## 2014-10-28 VITALS — BP 143/65 | HR 62 | Temp 97.5°F | Ht 59.75 in | Wt 138.5 lb

## 2014-10-28 DIAGNOSIS — R42 Dizziness and giddiness: Secondary | ICD-10-CM | POA: Diagnosis not present

## 2014-10-28 DIAGNOSIS — D649 Anemia, unspecified: Secondary | ICD-10-CM | POA: Diagnosis not present

## 2014-10-28 DIAGNOSIS — H6120 Impacted cerumen, unspecified ear: Secondary | ICD-10-CM | POA: Insufficient documentation

## 2014-10-28 DIAGNOSIS — H6121 Impacted cerumen, right ear: Secondary | ICD-10-CM | POA: Diagnosis not present

## 2014-10-28 DIAGNOSIS — G44219 Episodic tension-type headache, not intractable: Secondary | ICD-10-CM | POA: Insufficient documentation

## 2014-10-28 LAB — CBC WITH DIFFERENTIAL/PLATELET
Basophils Absolute: 0.1 10*3/uL (ref 0.0–0.1)
Basophils Relative: 1.1 % (ref 0.0–3.0)
EOS ABS: 0.1 10*3/uL (ref 0.0–0.7)
Eosinophils Relative: 1.6 % (ref 0.0–5.0)
HCT: 35.7 % — ABNORMAL LOW (ref 36.0–46.0)
Hemoglobin: 11.8 g/dL — ABNORMAL LOW (ref 12.0–15.0)
LYMPHS ABS: 2.6 10*3/uL (ref 0.7–4.0)
Lymphocytes Relative: 35.3 % (ref 12.0–46.0)
MCHC: 33.2 g/dL (ref 30.0–36.0)
MCV: 83.6 fl (ref 78.0–100.0)
Monocytes Absolute: 0.5 10*3/uL (ref 0.1–1.0)
Monocytes Relative: 6.7 % (ref 3.0–12.0)
NEUTROS ABS: 4.1 10*3/uL (ref 1.4–7.7)
Neutrophils Relative %: 55.3 % (ref 43.0–77.0)
PLATELETS: 313 10*3/uL (ref 150.0–400.0)
RBC: 4.27 Mil/uL (ref 3.87–5.11)
RDW: 13.9 % (ref 11.5–15.5)
WBC: 7.4 10*3/uL (ref 4.0–10.5)

## 2014-10-28 MED ORDER — MECLIZINE HCL 25 MG PO TABS: 25.0000 mg | ORAL_TABLET | Freq: Three times a day (TID) | ORAL | Status: DC | PRN

## 2014-10-28 MED ORDER — CYCLOBENZAPRINE HCL 5 MG PO TABS: 5.0000 mg | ORAL_TABLET | Freq: Three times a day (TID) | ORAL | Status: DC | PRN

## 2014-10-28 NOTE — Progress Notes (Signed)
Pre visit review using our clinic review tool, if applicable. No additional management support is needed unless otherwise documented below in the visit note. 

## 2014-10-28 NOTE — Progress Notes (Signed)
Subjective:    Patient ID: Sandy Merritt, female    DOB: 06-17-1932, 81 y.o.   MRN: [id number]  HPI  81YO female presents for follow up.  Last seen 3/24 for vertigo  Symptoms of vertigo have improved. Having some headaches. Seem more frequent over last few weeks. Typically right sided, but changes. Also has some neck pain, worsened with activity such as vacuuming. Takes occasional aspirin for pain with relief. Some nasal congestion noted recently, but this comes and goes. No photo or phonophobia. No nausea or vomiting.   Past medical, surgical, family and social history per today's encounter.  Review of Systems  Constitutional: Negative for fever, chills, appetite change, fatigue and unexpected weight change.  HENT: Positive for congestion.   Eyes: Negative for visual disturbance.  Respiratory: Negative for shortness of breath.   Cardiovascular: Negative for chest pain and leg swelling.  Gastrointestinal: Negative for abdominal pain.  Skin: Negative for color change and rash.  Neurological: Positive for headaches. Negative for dizziness, seizures, syncope, speech difficulty, weakness and numbness.  Hematological: Negative for adenopathy. Does not bruise/bleed easily.  Psychiatric/Behavioral: Negative for dysphoric mood. The patient is not nervous/anxious.        Objective:    BP 143/65 mmHg  Pulse 62  Temp(Src) 97.5 F (36.4 C) (Oral)  Ht 4' 11.75" (1.518 m)  Wt 138 lb 8 oz (62.823 kg)  BMI 27.26 kg/m2  SpO2 98% Physical Exam  Constitutional: She is oriented to person, place, and time. She appears well-developed and well-nourished. No distress.  HENT:  Head: Normocephalic and atraumatic.  Right Ear: External ear normal.  Left Ear: Tympanic membrane, external ear and ear canal normal.  Nose: Nose normal.  Mouth/Throat: Oropharynx is clear and moist. No oropharyngeal exudate.  Right ear canal cerumen impaction  Eyes: Conjunctivae are normal. Pupils are equal,  round, and reactive to light. Right eye exhibits no discharge. Left eye exhibits no discharge. No scleral icterus.  Neck: Normal range of motion. Neck supple. No tracheal deviation present. No thyromegaly present.  Cardiovascular: Normal rate, regular rhythm, normal heart sounds and intact distal pulses.  Exam reveals no gallop and no friction rub.   No murmur heard. Pulmonary/Chest: Effort normal and breath sounds normal. No respiratory distress. She has no wheezes. She has no rales. She exhibits no tenderness.  Musculoskeletal: Normal range of motion. She exhibits no edema or tenderness.  Lymphadenopathy:    She has no cervical adenopathy.  Neurological: She is alert and oriented to person, place, and time. No cranial nerve deficit. She exhibits normal muscle tone. Coordination normal.  Skin: Skin is warm and dry. No rash noted. She is not diaphoretic. No erythema. No pallor.  Psychiatric: She has a normal mood and affect. Her behavior is normal. Judgment and thought content normal.          Assessment & Plan:   Problem List Items Addressed This Visit      Unprioritized   Absolute anemia    Will repeat CBC today and check stool IFOB.      Relevant Orders   CBC w/Diff   Fecal occult blood, imunochemical   Cerumen impaction    Right ear canal cerumen impaction. Recommended lavage, however pt declines. Will start prn Debrox OTC. Recheck 4 weeks.      Episodic tension-type headache, not intractable    Symptoms and exam most consistent with tension headaches, related to chronic neck pain.  Will start Flexeril 5mg  tid prn. Follow up in  4 weeks.  Reviewed MRI brain from 02/2014 which was normal      Relevant Medications   cyclobenzaprine (FLEXERIL) tablet   Vertigo - Primary    BPV. Symptoms of vertigo have improved. Will continue prn Meclizine.       Relevant Medications   meclizine (ANTIVERT) tablet       Return in about 4 weeks (around 11/25/2014) for Recheck.

## 2014-10-28 NOTE — Patient Instructions (Signed)
Labs today to check blood counts. Please submit a stool sample to check for blood.  Please keep a log of your headaches.   Start Flexeril 5mg  up to three times daily as needed for headache. This medication can make you drowsy.

## 2014-10-28 NOTE — Assessment & Plan Note (Signed)
Will repeat CBC today and check stool IFOB.

## 2014-10-28 NOTE — Assessment & Plan Note (Signed)
Right ear canal cerumen impaction. Recommended lavage, however pt declines. Will start prn Debrox OTC. Recheck 4 weeks.

## 2014-10-28 NOTE — Assessment & Plan Note (Signed)
BPV. Symptoms of vertigo have improved. Will continue prn Meclizine.

## 2014-10-28 NOTE — Assessment & Plan Note (Signed)
Symptoms and exam most consistent with tension headaches, related to chronic neck pain.  Will start Flexeril 5mg  tid prn. Follow up in 4 weeks.  Reviewed MRI brain from 02/2014 which was normal

## 2014-10-29 ENCOUNTER — Encounter: Payer: Self-pay | Admitting: Internal Medicine

## 2014-10-31 ENCOUNTER — Other Ambulatory Visit (INDEPENDENT_AMBULATORY_CARE_PROVIDER_SITE_OTHER): Payer: Medicare Other

## 2014-10-31 ENCOUNTER — Other Ambulatory Visit: Payer: Self-pay | Admitting: *Deleted

## 2014-10-31 DIAGNOSIS — Z1211 Encounter for screening for malignant neoplasm of colon: Secondary | ICD-10-CM

## 2014-10-31 LAB — FECAL OCCULT BLOOD, IMMUNOCHEMICAL: Fecal Occult Bld: NEGATIVE

## 2014-11-03 ENCOUNTER — Telehealth: Payer: Self-pay

## 2014-11-03 NOTE — Telephone Encounter (Signed)
PA denied, paperwork placed in Dr. Thomes Dinning box.

## 2014-11-03 NOTE — Telephone Encounter (Signed)
PA required for Cyclobenzaprine Rx. PA completed on cover my meds. Awaiting response at this time

## 2014-11-06 ENCOUNTER — Encounter: Payer: Self-pay | Admitting: *Deleted

## 2014-11-23 ENCOUNTER — Other Ambulatory Visit: Payer: Self-pay | Admitting: Internal Medicine

## 2014-12-11 ENCOUNTER — Other Ambulatory Visit: Payer: Self-pay | Admitting: Internal Medicine

## 2014-12-11 NOTE — Telephone Encounter (Signed)
Ok to refill? Last seen on 10/28/2014. Rx approved in January with 3 refills.

## 2014-12-11 NOTE — Telephone Encounter (Signed)
Fine to fill. 

## 2014-12-12 ENCOUNTER — Other Ambulatory Visit: Payer: Self-pay | Admitting: *Deleted

## 2014-12-12 MED ORDER — PREGABALIN 150 MG PO CAPS: 150.0000 mg | ORAL_CAPSULE | Freq: Two times a day (BID) | ORAL | Status: DC

## 2014-12-12 NOTE — Telephone Encounter (Signed)
Medication was approved by Dr Gilford Rile, was refused by accident.  Rx signed by Flonnie Overman and sent to pharmacy

## 2014-12-17 ENCOUNTER — Telehealth: Payer: Self-pay

## 2014-12-17 NOTE — Telephone Encounter (Signed)
I did not see a medication on med list for constipation.  Please advise refill

## 2014-12-17 NOTE — Telephone Encounter (Signed)
The patient called hoping to get a medicine called in for constipation.  She states she has being using a powder that was prescribed, but has forgotten the name of the [place] on [address]   Pt callback - [phone]

## 2014-12-17 NOTE — Telephone Encounter (Signed)
Miralax 17gm po daily prn, mixed with a beverage. This is OTC

## 2014-12-18 NOTE — Telephone Encounter (Signed)
Spoke with pt, advised of MDs message.  Pt verbalized understanding 

## 2014-12-30 ENCOUNTER — Other Ambulatory Visit: Payer: Self-pay | Admitting: Internal Medicine

## 2014-12-30 ENCOUNTER — Encounter: Payer: Self-pay | Admitting: Internal Medicine

## 2014-12-30 DIAGNOSIS — R921 Mammographic calcification found on diagnostic imaging of breast: Secondary | ICD-10-CM

## 2015-01-15 ENCOUNTER — Other Ambulatory Visit: Payer: Self-pay | Admitting: Internal Medicine

## 2015-02-12 ENCOUNTER — Ambulatory Visit
Admission: RE | Admit: 2015-02-12 | Discharge: 2015-02-12 | Disposition: A | Discharge status: Home / Self Care | Payer: Medicare Other | Source: Ambulatory Visit | Attending: Internal Medicine | Admitting: Internal Medicine

## 2015-02-12 ENCOUNTER — Other Ambulatory Visit: Payer: Self-pay | Admitting: Internal Medicine

## 2015-02-12 DIAGNOSIS — R921 Mammographic calcification found on diagnostic imaging of breast: Secondary | ICD-10-CM

## 2015-02-13 ENCOUNTER — Telehealth: Payer: Self-pay

## 2015-02-13 NOTE — Telephone Encounter (Signed)
Error

## 2015-02-13 NOTE — Telephone Encounter (Signed)
Rx faxed to pharmacy  

## 2015-02-13 NOTE — Telephone Encounter (Signed)
Last refilled 08/19/14 for #30 with 4 refills. Last office visit 10/28/14. Ok to refill?

## 2015-02-16 ENCOUNTER — Other Ambulatory Visit: Payer: Self-pay | Admitting: Internal Medicine

## 2015-03-03 ENCOUNTER — Encounter: Payer: Self-pay | Admitting: Internal Medicine

## 2015-03-03 ENCOUNTER — Ambulatory Visit (INDEPENDENT_AMBULATORY_CARE_PROVIDER_SITE_OTHER): Payer: Medicare Other | Admitting: Internal Medicine

## 2015-03-03 VITALS — BP 112/59 | HR 73 | Temp 98.1°F | Ht 59.75 in | Wt 139.2 lb

## 2015-03-03 DIAGNOSIS — L609 Nail disorder, unspecified: Secondary | ICD-10-CM

## 2015-03-03 DIAGNOSIS — L988 Other specified disorders of the skin and subcutaneous tissue: Secondary | ICD-10-CM

## 2015-03-03 DIAGNOSIS — M79671 Pain in right foot: Secondary | ICD-10-CM | POA: Insufficient documentation

## 2015-03-03 DIAGNOSIS — N649 Disorder of breast, unspecified: Secondary | ICD-10-CM | POA: Diagnosis not present

## 2015-03-03 DIAGNOSIS — C44511 Basal cell carcinoma of skin of breast: Secondary | ICD-10-CM | POA: Insufficient documentation

## 2015-03-03 DIAGNOSIS — L608 Other nail disorders: Secondary | ICD-10-CM

## 2015-03-03 NOTE — Assessment & Plan Note (Signed)
Will get plain xray to evaluate pain and hardware from previous surgery. Referral to Dr. Milinda Pointer in podiatry.

## 2015-03-03 NOTE — Progress Notes (Signed)
Pre visit review using our clinic review tool, if applicable. No additional management support is needed unless otherwise documented below in the visit note. 

## 2015-03-03 NOTE — Progress Notes (Signed)
Subjective:    Patient ID: Sandy Merritt, female    DOB: 05-27-1932, 81 y.o.   MRN: [id number]  HPI  81YO female presents for acute visit.  Toe pain - right foot/toe pain over ball of foot. Notes surgery in this area 2 years ago. Concerned that a "screw" is poking out of her foot, leading to callous formation and pain. Would like to have further evaluation. No redness, drainage. No fever, chills. Notes pain when any weight is applied to area.  Skin lesion right breast  - at site of previous biopsy, has redness of skin which has been persistent. Not itching or painful.  Concerned this may be recurrent skin cancer.  Past Medical History  Diagnosis Date  . Chicken pox   . Headaches, cluster   . GERD (gastroesophageal reflux disease)   . Glaucoma   . Urine incontinence   . Arthritis     rheumatoid  . Lupus    Family History  Problem Relation Age of Onset  . Arthritis Mother   . Diabetes Mother   . Heart disease Mother   . Arthritis Father   . Heart disease Father   . Heart attack Father   . Diabetes Grandchild   . Drug abuse Maternal Aunt   . Colon cancer Neg Hx    Past Surgical History  Procedure Laterality Date  . Cholecystectomy  1970  . Appendectomy  1970  . Tonsillectomy and adenoidectomy  1955  . Abdominal hysterectomy    . Vaginal delivery      1  . Cesarean section      2  . Foot surgery  years ago    "toes"/"heart stopped during surgery" per pt.only(not medical staff)  . Cervical spine surgery      ant. neck/done x2/ screws out now   Social History   Social History  . Marital Status: Divorced    Spouse Name: N/A  . Number of Children: N/A  . Years of Education: N/A   Social History Main Topics  . Smoking status: Never Smoker   . Smokeless tobacco: Never Used  . Alcohol Use: No  . Drug Use: No  . Sexual Activity: Not Asked   Other Topics Concern  . None   Social History Narrative   Lives in [place] alone. 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     Review of Systems  Constitutional: Negative for fever, chills, appetite change, fatigue and unexpected weight change.  Eyes: Negative for visual disturbance.  Respiratory: Negative for shortness of breath.   Cardiovascular: Negative for chest pain and leg swelling.  Gastrointestinal: Negative for abdominal pain, diarrhea and constipation.  Musculoskeletal: Positive for joint swelling, arthralgias and gait problem.  Skin: Positive for color change and wound. Negative for rash.  Hematological: Negative for adenopathy. Does not bruise/bleed easily.  Psychiatric/Behavioral: Negative for sleep disturbance and dysphoric mood. The patient is not nervous/anxious.        Objective:    BP 112/59 mmHg  Pulse 73  Temp(Src) 98.1 F (36.7 C) (Oral)  Ht 4' 11.75" (1.518 m)  Wt 139 lb 4 oz (63.163 kg)  BMI 27.41 kg/m2  SpO2 96% Physical Exam  Constitutional: She is oriented to person, place, and time. She appears well-developed and well-nourished. No distress.  HENT:  Head: Normocephalic and atraumatic.  Right Ear: External ear normal.  Left Ear: External ear normal.  Nose: Nose normal.  Mouth/Throat: Oropharynx is clear and moist. No oropharyngeal exudate.  Eyes: Conjunctivae are normal. Pupils  are equal, round, and reactive to light. Right eye exhibits no discharge. Left eye exhibits no discharge. No scleral icterus.  Neck: Normal range of motion. Neck supple. No tracheal deviation present. No thyromegaly present.  Cardiovascular: Normal rate, regular rhythm, normal heart sounds and intact distal pulses.  Exam reveals no gallop and no friction rub.   No murmur heard. Pulmonary/Chest: Effort normal and breath sounds normal. No respiratory distress. She has no decreased breath sounds. She has no wheezes. She has no rhonchi. She has no rales. She exhibits no tenderness. Right breast exhibits skin change.    Musculoskeletal: Normal range of motion. She exhibits no edema or tenderness.        Feet:  Lymphadenopathy:    She has no cervical adenopathy.  Neurological: She is alert and oriented to person, place, and time. No cranial nerve deficit. She exhibits normal muscle tone. Coordination normal.  Skin: Skin is warm and dry. No rash noted. She is not diaphoretic. No erythema. No pallor.  Psychiatric: She has a normal mood and affect. Her behavior is normal. Judgment and thought content normal.          Assessment & Plan:   Problem List Items Addressed This Visit      Unprioritized   Right foot pain - Primary    Will get plain xray to evaluate pain and hardware from previous surgery. Referral to Dr. Milinda Pointer in podiatry.      Relevant Orders   DG Foot Complete Right   Skin lesion of breast    Skin lesion right breast. Previous biopsy showed BCC. Will set up dermatology evaluation for possible biopsy/excision of recurrent area.      Relevant Orders   Ambulatory referral to Dermatology   Toenail deformity    Will set up podiatry evaluation for toenail removal.      Relevant Orders   Ambulatory referral to Podiatry       Return in about 4 weeks (around 03/31/2015) for Recheck.

## 2015-03-03 NOTE — Assessment & Plan Note (Signed)
Skin lesion right breast. Previous biopsy showed BCC. Will set up dermatology evaluation for possible biopsy/excision of recurrent area.

## 2015-03-03 NOTE — Assessment & Plan Note (Signed)
Will set up podiatry evaluation for toenail removal.

## 2015-03-03 NOTE — Patient Instructions (Addendum)
Please go to [facility] to get xray of right foot.  We will set up visit with Dr. Milinda Pointer.  We will also set up a visit with Dr. Nehemiah Massed in dermatology.

## 2015-03-04 ENCOUNTER — Ambulatory Visit
Admission: RE | Admit: 2015-03-04 | Discharge: 2015-03-04 | Disposition: A | Discharge status: Home / Self Care | Payer: Medicare Other | Source: Ambulatory Visit | Attending: Internal Medicine | Admitting: Internal Medicine

## 2015-03-04 DIAGNOSIS — M2011 Hallux valgus (acquired), right foot: Secondary | ICD-10-CM | POA: Diagnosis not present

## 2015-03-04 DIAGNOSIS — M79671 Pain in right foot: Secondary | ICD-10-CM | POA: Diagnosis present

## 2015-03-04 DIAGNOSIS — M19071 Primary osteoarthritis, right ankle and foot: Secondary | ICD-10-CM | POA: Insufficient documentation

## 2015-03-10 ENCOUNTER — Telehealth: Payer: Self-pay | Admitting: *Deleted

## 2015-03-10 ENCOUNTER — Other Ambulatory Visit: Payer: Self-pay | Admitting: Internal Medicine

## 2015-03-10 NOTE — Telephone Encounter (Signed)
Pt's dtr, Shireen Quan states pt is scheduled for surgery on 03/11/2015, and she would like to discuss aftercare.  I reviewed pt's appt schedule, she is scheduled in [place] with Dr. Milinda Pointer to discuss a toe deformity and a white spot on her foot.  I reviewed pt's 33 and Tressie Ellis is on the list, I informed her the pt was scheduled for a doctor visit, but could possibly be a surgery consult, and she may want to accompany the pt, to get instructions if surgery is scheduled.  Sharone agreed.

## 2015-03-11 ENCOUNTER — Encounter: Payer: Self-pay | Admitting: Podiatry

## 2015-03-11 ENCOUNTER — Ambulatory Visit: Payer: Medicare Other

## 2015-03-11 ENCOUNTER — Ambulatory Visit (INDEPENDENT_AMBULATORY_CARE_PROVIDER_SITE_OTHER): Payer: Medicare Other | Admitting: Podiatry

## 2015-03-11 ENCOUNTER — Ambulatory Visit: Payer: Medicare Other | Admitting: Internal Medicine

## 2015-03-11 DIAGNOSIS — L600 Ingrowing nail: Secondary | ICD-10-CM

## 2015-03-11 DIAGNOSIS — Q828 Other specified congenital malformations of skin: Secondary | ICD-10-CM | POA: Diagnosis not present

## 2015-03-11 DIAGNOSIS — M7751 Other enthesopathy of right foot: Secondary | ICD-10-CM

## 2015-03-11 DIAGNOSIS — M2041 Other hammer toe(s) (acquired), right foot: Secondary | ICD-10-CM

## 2015-03-11 DIAGNOSIS — M2580 Other specified joint disorders, unspecified joint: Secondary | ICD-10-CM

## 2015-03-11 DIAGNOSIS — R52 Pain, unspecified: Secondary | ICD-10-CM

## 2015-03-11 DIAGNOSIS — M778 Other enthesopathies, not elsewhere classified: Secondary | ICD-10-CM

## 2015-03-11 DIAGNOSIS — L84 Corns and callosities: Secondary | ICD-10-CM

## 2015-03-11 DIAGNOSIS — M779 Enthesopathy, unspecified: Secondary | ICD-10-CM

## 2015-03-11 NOTE — Telephone Encounter (Signed)
Last OV 8.16.16.  Please advise refill

## 2015-03-11 NOTE — Telephone Encounter (Signed)
rx faxed

## 2015-03-11 NOTE — Progress Notes (Signed)
   Subjective:    Patient ID: Sandy Merritt, female    DOB: 17-Sep-1931, 81 y.o.   MRN: [id number]  HPI I HAVE SOME PAIN ON MY RIGHT GREAT TOE AND A CALLUS LIKE SPOT ON THE BOTTOM OF MY RIGHT FOOT AND I HAD SURGERY ON THE RIGHT FOOT 5 YEARS AGO AND THE SECOND TOE IS LAYING OVER THE BIG TOE AND HURTS ALL THE TIME, AND A CALLUS ON THE BOTTOM OF MY FOOT AND HAD AN X-RAY DONE LAST WEEK AT [facility] FOR MY RIGHT FOOT AND THE BIG TOENAIL AND 2ND TOENAIL THE NAILS ARE COMING OFF  She presents today with her son and daughter requesting a surgical consult for her right foot. She states that she would like to have the screws removed from the second toe and the first metatarsal so that the toes will lay down flat. She is also concerned about the painful toenails second and first digits left foot.  Review of Systems  All other systems reviewed and are negative.      Objective:   Physical Exam: She is a 81 year old white female in no acute distress. Vital signs are stable she is alert and oriented 3. Pulses are strongly palpable. Neurologic sensorium is intact per Semmes-Weinstein monofilament. Deep tendon reflexes are intact. Muscle strength +5 over 5 dorsiflexion plantar flexors and inverters everters all into the musculature is intact. Orthopedic evaluation demonstrates an overlapping hammertoe deformity second digit of the right foot. Mild hallux valgus deformity is present. A previous surgery performed by a different practice failed to alleviate her symptoms. She has painful second metatarsophalangeal joint right foot and a painful second digit which is rigid on evaluation. Radiographic evaluation. Radiographs taken in the office today demonstrate the screw to the second metatarsal from a previous second metatarsal osteotomy and a screw to the hallux right from a previous Akin osteotomy. These are not the cause of her problems. Cutaneous evaluation of a straight supple well-hydrated cutis she has a very  thick large reactive hyperkeratotic lesion just beneath the tibial sesamoid of the right foot. Radiographs and firm hypertrophic tibial sesamoid right foot. Cutaneous evaluation there was a supple well-hydrated cutis with exception of a solitary porokeratotic lesions sub-first metatarsophalangeal joint. She also has dystrophic painful nails hallux and second digit left foot.        Assessment & Plan:  Assessment: Painful overlapping hammertoe deformity second right with osteoarthritic changes. Hypertrophic tibial sesamoid resulting in a porokeratotic lesion plantar aspect first metatarsophalangeal joint right foot pre-ulcerative. Painful toenail deformities hallux and second digit left.  Plan: Discussed etiology pathology conservative versus surgical therapies at this point we are going to request medical clearance from Dr. Ronette Deter. We consented her today for a disarticulation amputation second digit right foot. Excision of a hypertrophic tibial sesamoid right foot. Surgical excision of nails #1 and #2 and the left foot. We went over the consent form today line by line number by number giving her and her children the opportunity to ask questions they saw fit regarding these procedures. I answered the questions to the best of my ability in layman's terms they understood this were amenable to it and signed all 3 pages of the consent form. This surgery will take place at the [facility] with IV sedation and local and aesthetic. This is provided we have medical clearance from Dr. Gilford Rile.

## 2015-03-20 ENCOUNTER — Encounter: Payer: Self-pay | Admitting: Gastroenterology

## 2015-03-20 ENCOUNTER — Encounter: Payer: Self-pay | Admitting: Internal Medicine

## 2015-03-25 ENCOUNTER — Other Ambulatory Visit: Payer: Self-pay | Admitting: Internal Medicine

## 2015-03-25 NOTE — Telephone Encounter (Signed)
rx faxed

## 2015-03-25 NOTE — Telephone Encounter (Signed)
Last OV 8.16.16.  Please advise refill 

## 2015-04-02 ENCOUNTER — Other Ambulatory Visit: Payer: Self-pay | Admitting: Internal Medicine

## 2015-04-02 ENCOUNTER — Telehealth: Payer: Self-pay | Admitting: *Deleted

## 2015-04-02 NOTE — Telephone Encounter (Signed)
Last OV 8.16.16.  Please advise refill 

## 2015-04-02 NOTE — Telephone Encounter (Signed)
I left patient a message to give me a call.  I need to ask if she would like to schedule surgery with Dr. Milinda Pointer.

## 2015-04-03 NOTE — Telephone Encounter (Signed)
This Rx was just refilled and faxed last week.

## 2015-04-06 ENCOUNTER — Ambulatory Visit: Payer: Medicare Other | Admitting: Internal Medicine

## 2015-04-07 NOTE — Telephone Encounter (Signed)
"  I came up to see the doctor.  I want to get surgery for my Hammer Toes.  I'd like to do it as soon as possible.  I hope it will be soon because I'm tired of waiting.  Help me get this thing behind me.  I'll be home all afternoon."

## 2015-04-07 NOTE — Telephone Encounter (Signed)
"  Please call my mother.  We need to schedule surgery if it was approved.  If you can't reach her, you can call me.  We'd appreciate a call back.  Thank you."

## 2015-04-08 ENCOUNTER — Encounter: Payer: Self-pay | Admitting: *Deleted

## 2015-04-08 NOTE — Telephone Encounter (Signed)
"  I'm calling regarding my mother's surgery.  She's confused and she's making me confused.  She said she was supposed to get clearance then you all would call.  I don't know what is going on."  We have not received clearance from Dr. Gilford Rile.  "Well it's been 3 weeks and you haven't heard anything?"  Letter has not been sent.  I will send it today.  Once I get a response, I will call.  "Can you call us both when it's approved.  I want to stay on top of it because she gets confused."  Okay, I'll let you know.  I faxed medical clearance letter to Dr. Ronette Deter.

## 2015-04-09 ENCOUNTER — Telehealth: Payer: Self-pay | Admitting: *Deleted

## 2015-04-09 NOTE — Telephone Encounter (Signed)
Received surgical clearance request from Dr Milinda Pointer at [facility], Per Dr Gilford Rile, Pt will need to schedule an appt for surgical clearance, Call and left vm for pt to return my call

## 2015-04-10 NOTE — Telephone Encounter (Signed)
I called and left patient a message to call me back.  I need to inform her that she will need to be seen by Dr. Gilford Rile before medical clearance can be given.  I called and spoke to her daughter Adonis Huguenin and informed her.  She stated they would get that taken care of.  "Do you think she is too old to have this done?  She is 81 years old."  I don't know.  "Okay, I'll call Dr. Thomes Dinning office."

## 2015-04-10 NOTE — Telephone Encounter (Signed)
"  You called me and left a message to call you."  Yes, I was calling to let you know that Dr. Gilford Rile needs to see you for a check-up before she can give medical clearance. You will need to call her and schedule an appointment.  I called and spoke to your daughter early.  "Okay, maybe she's already scheduled an appointment.  I will call and check with her first before I call over there.  Thanks for calling."

## 2015-04-12 ENCOUNTER — Other Ambulatory Visit: Payer: Self-pay | Admitting: Internal Medicine

## 2015-04-13 NOTE — Telephone Encounter (Signed)
Okay to refill? 

## 2015-04-15 ENCOUNTER — Telehealth: Payer: Self-pay | Admitting: Internal Medicine

## 2015-04-15 NOTE — Telephone Encounter (Signed)
Patient confused,hallucinating and forgetting . Her daughter wants the physician to reconsider the patient taking a sleeping pill and also to be evaluated for dementia.  Both of the patient's sisters have had Alzheimer per the patient's daughter. Daughter stated that she thinks she is taking more than the prescribed dosage. Sandy Merritt [phone].

## 2015-04-17 ENCOUNTER — Other Ambulatory Visit: Payer: Self-pay | Admitting: Internal Medicine

## 2015-04-17 NOTE — Telephone Encounter (Signed)
You are seeing her next week, Sandy Merritt spoke with the daughter and she is comfortable waiting until then.

## 2015-04-19 NOTE — Telephone Encounter (Signed)
This would be highly unusual for her. 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 She needs to be seen as soon as possible if having hallucinations.

## 2015-04-21 ENCOUNTER — Telehealth: Payer: Self-pay

## 2015-04-21 NOTE — Telephone Encounter (Signed)
Needs to be seen in a visit.. 59min

## 2015-04-21 NOTE — Telephone Encounter (Signed)
Patient called the triage line, requesting a sleeping pill.  Please advise, no orders for one.  Thanks

## 2015-04-21 NOTE — Telephone Encounter (Signed)
Pt daughter does not want pt to know that she called regarding the sleeping pill daughter feels her memory is going due to her waking up at night and taking another pill. Daughter says she wants you to use your judgement. Daughter wants the question ask how's her memory. Thank You!

## 2015-04-21 NOTE — Telephone Encounter (Signed)
She has an appointment scheduled for the 10/6.

## 2015-04-23 ENCOUNTER — Telehealth: Payer: Self-pay | Admitting: Internal Medicine

## 2015-04-23 ENCOUNTER — Encounter: Payer: Self-pay | Admitting: Internal Medicine

## 2015-04-23 ENCOUNTER — Ambulatory Visit (INDEPENDENT_AMBULATORY_CARE_PROVIDER_SITE_OTHER): Payer: Medicare Other | Admitting: Internal Medicine

## 2015-04-23 ENCOUNTER — Ambulatory Visit: Payer: Medicare Other | Admitting: Internal Medicine

## 2015-04-23 VITALS — BP 104/57 | HR 68 | Temp 98.0°F | Ht 60.0 in | Wt 137.1 lb

## 2015-04-23 DIAGNOSIS — Z79899 Other long term (current) drug therapy: Secondary | ICD-10-CM

## 2015-04-23 DIAGNOSIS — R300 Dysuria: Secondary | ICD-10-CM

## 2015-04-23 DIAGNOSIS — F05 Delirium due to known physiological condition: Secondary | ICD-10-CM

## 2015-04-23 DIAGNOSIS — R410 Disorientation, unspecified: Secondary | ICD-10-CM

## 2015-04-23 LAB — POCT URINALYSIS DIPSTICK
BILIRUBIN UA: NEGATIVE
Blood, UA: NEGATIVE
Glucose, UA: NEGATIVE
KETONES UA: NEGATIVE
Leukocytes, UA: NEGATIVE
Nitrite, UA: NEGATIVE
PH UA: 6.5
PROTEIN UA: NEGATIVE
SPEC GRAV UA: 1.01
Urobilinogen, UA: 0.2

## 2015-04-23 LAB — COMPREHENSIVE METABOLIC PANEL
ALBUMIN: 4.1 g/dL (ref 3.5–5.2)
ALT: 16 U/L (ref 0–35)
AST: 21 U/L (ref 0–37)
Alkaline Phosphatase: 53 U/L (ref 39–117)
BILIRUBIN TOTAL: 0.4 mg/dL (ref 0.2–1.2)
BUN: 14 mg/dL (ref 6–23)
CALCIUM: 9.2 mg/dL (ref 8.4–10.5)
CHLORIDE: 99 meq/L (ref 96–112)
CO2: 27 meq/L (ref 19–32)
CREATININE: 0.89 mg/dL (ref 0.40–1.20)
GFR: 64.71 mL/min (ref >60.00)
Glucose, Bld: 116 mg/dL — ABNORMAL HIGH (ref 70–99)
Potassium: 4 mEq/L (ref 3.5–5.1)
Sodium: 137 mEq/L (ref 135–145)
Total Protein: 7.5 g/dL (ref 6.0–8.3)

## 2015-04-23 LAB — CBC
HEMATOCRIT: 36.5 % (ref 36.0–46.0)
Hemoglobin: 11.9 g/dL — ABNORMAL LOW (ref 12.0–15.0)
MCHC: 32.6 g/dL (ref 30.0–36.0)
MCV: 84.6 fl (ref 78.0–100.0)
Platelets: 277 10*3/uL (ref 150.0–400.0)
RBC: 4.32 Mil/uL (ref 3.87–5.11)
RDW: 15 % (ref 11.5–15.5)
WBC: 7.9 10*3/uL (ref 4.0–10.5)

## 2015-04-23 LAB — TSH: TSH: 1.32 u[IU]/mL (ref 0.35–4.50)

## 2015-04-23 LAB — VITAMIN B12: VITAMIN B 12: 464 pg/mL (ref 211–911)

## 2015-04-23 NOTE — Telephone Encounter (Signed)
Pt needs refill on Eszopiclone 3 mg tab sleeping pill.msn

## 2015-04-23 NOTE — Progress Notes (Signed)
Pre visit review using our clinic review tool, if applicable. No additional management support is needed unless otherwise documented below in the visit note. 

## 2015-04-23 NOTE — Assessment & Plan Note (Signed)
Recent phone call to our office by pt daughter, Adonis Huguenin, reporting confusion. Pt oriented to person, place, time. She is able to hold detailed conversation without difficulty. She performs clock face drawing without difficulty. Will check UA today, given recent dysuria, question if she may have had UTI. Will also check TSH, CBC, B12. Reviewed MRI brain from 2015 which was normal except for mild atrophy. Follow up 2 weeks and prn.

## 2015-04-23 NOTE — Telephone Encounter (Signed)
Pt called about her sleeping pills being called into the pharmacy. Pt says she's been up since 3pm. Pharmacy is [facility]. Thank You!

## 2015-04-23 NOTE — Telephone Encounter (Signed)
I am waiting for Dr. Gilford Rile to review this.

## 2015-04-23 NOTE — Assessment & Plan Note (Signed)
Recent episode of mild dysuria. Will check UA with labs today.

## 2015-04-23 NOTE — Patient Instructions (Signed)
Labs today     Follow up in 2 weeks

## 2015-04-23 NOTE — Progress Notes (Signed)
Subjective:    Patient ID: Sandy Merritt, female    DOB: 1932/01/25, 81 y.o.   MRN: [id number]  HPI  81YO female presents for follow up.  Her daughter recently called our office and Dr. Stephenie Acres, stating that she seemed more confused. 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 Daughter had called to say she was not able to work there. She is angry at her daughter for this. Pt lives in [place]. Daughter lives in [place]. She thinks her daughter, Adonis Huguenin, has been going through her personal belongings. She does not trust her daughter, Adonis Huguenin.  She denies any confusion.  Other daughter, [name], lives in [place] and is health care POA.  Physically, feeling well. Recently had some burning with urination.  Managing own finances. Has not missed any payments. Pays $2600 per month and food and electric. Pays telephone separately. Date/Time: Thursday, 10/6 Able to draw clock face without any difficulty.    Wt Readings from Last 3 Encounters:  04/23/15 137 lb 2 oz (62.199 kg)  03/03/15 139 lb 4 oz (63.163 kg)  10/28/14 138 lb 8 oz (62.823 kg)   BP Readings from Last 3 Encounters:  04/23/15 104/57  03/03/15 112/59  10/28/14 143/65    Past Medical History  Diagnosis Date  . Chicken pox   . Headaches, cluster   . GERD (gastroesophageal reflux disease)   . Glaucoma   . Urine incontinence   . Arthritis     rheumatoid  . Lupus ([place])    Family History  Problem Relation Age of Onset  . Arthritis Mother   . Diabetes Mother   . Heart disease Mother   . Arthritis Father   . Heart disease Father   . Heart attack Father   . Diabetes Grandchild   . Drug abuse Maternal Aunt   . Colon cancer Neg Hx    Past Surgical History  Procedure Laterality Date  . Cholecystectomy  1970  . Appendectomy  1970  . Tonsillectomy and adenoidectomy  1955  . Abdominal hysterectomy    . Vaginal delivery      1  . Cesarean section      2  . Foot surgery  years ago    "toes"/"heart stopped during surgery" per pt.only(not medical staff)  . Cervical spine surgery      ant. neck/done x2/ screws out now   Social History   Social History  . Marital Status: Divorced    Spouse Name: N/A  . Number of Children: N/A  . Years of Education: N/A   Social History Main Topics  . Smoking status: Never Smoker   . Smokeless tobacco: Never Used  . Alcohol Use: No  . Drug Use: No  . Sexual Activity: Not Asked   Other Topics Concern  . None   Social History Narrative   Lives in [place] alone. 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    Review of Systems  Constitutional: Negative for fever, chills, appetite change, fatigue and unexpected weight change.  Eyes: Negative for visual disturbance.  Respiratory: Negative for shortness of breath.   Cardiovascular: Negative for chest pain and leg swelling.  Gastrointestinal: Negative for nausea, vomiting, abdominal pain, diarrhea and constipation.  Musculoskeletal: Negative for myalgias and arthralgias.  Skin: Negative for color change and rash.  Hematological: Negative for adenopathy. Does not bruise/bleed easily.  Psychiatric/Behavioral: Negative for behavioral problems, confusion, sleep disturbance, dysphoric mood, decreased concentration and agitation. The patient is not nervous/anxious.  Objective:    BP 104/57 mmHg  Pulse 68  Temp(Src) 98 F (36.7 C) (Oral)  Ht 5' (1.524 m)  Wt 137 lb 2 oz (62.199 kg)  BMI 26.78 kg/m2  SpO2 95% Physical Exam  Constitutional: She is oriented to person, place, and time. She appears well-developed and well-nourished. No distress.  HENT:  Head: Normocephalic and atraumatic.  Right Ear: External ear normal.  Left Ear: External ear normal.  Nose: Nose normal.  Mouth/Throat: Oropharynx is clear and moist. No oropharyngeal exudate.  Eyes: Conjunctivae are normal. Pupils are equal, round, and reactive to light. Right eye exhibits no discharge. Left eye exhibits no  discharge. No scleral icterus.  Neck: Normal range of motion. Neck supple. No tracheal deviation present. No thyromegaly present.  Cardiovascular: Normal rate, regular rhythm, normal heart sounds and intact distal pulses.  Exam reveals no gallop and no friction rub.   No murmur heard. Pulmonary/Chest: Effort normal and breath sounds normal. No respiratory distress. She has no wheezes. She has no rales. She exhibits no tenderness.  Musculoskeletal: Normal range of motion. She exhibits no edema or tenderness.  Lymphadenopathy:    She has no cervical adenopathy.  Neurological: She is alert and oriented to person, place, and time. No cranial nerve deficit. She exhibits normal muscle tone. Coordination normal.  Skin: Skin is warm and dry. No rash noted. She is not diaphoretic. No erythema. No pallor.  Psychiatric: She has a normal mood and affect. Her speech is normal and behavior is normal. Judgment and thought content normal. Cognition and memory are normal.  Able to complete drawing of clock face without difficulty          Assessment & Plan:   Problem List Items Addressed This Visit      Unprioritized   Dysuria    Recent episode of mild dysuria. Will check UA with labs today.      Relevant Orders   POCT Urinalysis Dipstick   Subacute confusional state - Primary    Recent phone call to our office by pt daughter, Adonis Huguenin, reporting confusion. Pt oriented to person, place, time. She is able to hold detailed conversation without difficulty. She performs clock face drawing without difficulty. Will check UA today, given recent dysuria, question if she may have had UTI. Will also check TSH, CBC, B12. Reviewed MRI brain from 2015 which was normal except for mild atrophy. Follow up 2 weeks and prn.          Return in about 2 weeks (around 05/07/2015) for Recheck.

## 2015-04-23 NOTE — Telephone Encounter (Signed)
Please advise, your visit note didn't discuss this today!

## 2015-04-24 NOTE — Telephone Encounter (Signed)
I saw her yesterday. Fine to refill Lunesta

## 2015-04-27 ENCOUNTER — Other Ambulatory Visit: Payer: Self-pay

## 2015-04-27 MED ORDER — ESZOPICLONE 3 MG PO TABS: 3.0000 mg | ORAL_TABLET | Freq: Every day | ORAL | Status: DC

## 2015-04-27 NOTE — Telephone Encounter (Signed)
Refilled and faxed to pharmacy

## 2015-05-06 ENCOUNTER — Other Ambulatory Visit: Payer: Self-pay | Admitting: Internal Medicine

## 2015-05-06 NOTE — Telephone Encounter (Signed)
Pharmacy didn't receive faxed Rx on 04/27/15, Per Dr Gilford Rile okay to give 3 refills

## 2015-05-12 ENCOUNTER — Ambulatory Visit: Payer: Medicare Other | Admitting: Gastroenterology

## 2015-05-14 ENCOUNTER — Ambulatory Visit: Payer: Medicare Other | Admitting: Internal Medicine

## 2015-05-19 ENCOUNTER — Ambulatory Visit: Payer: Medicare Other | Admitting: Gastroenterology

## 2015-05-19 ENCOUNTER — Ambulatory Visit (INDEPENDENT_AMBULATORY_CARE_PROVIDER_SITE_OTHER): Payer: Medicare Other | Admitting: Nurse Practitioner

## 2015-05-19 VITALS — BP 112/82 | HR 66 | Temp 97.9°F | Resp 12 | Ht 60.0 in | Wt 129.0 lb

## 2015-05-19 DIAGNOSIS — R350 Frequency of micturition: Secondary | ICD-10-CM

## 2015-05-19 LAB — POCT URINALYSIS DIPSTICK
Bilirubin, UA: NEGATIVE
Glucose, UA: NEGATIVE
KETONES UA: NEGATIVE
Leukocytes, UA: NEGATIVE
Nitrite, UA: NEGATIVE
PROTEIN UA: NEGATIVE
Urobilinogen, UA: 0.2
pH, UA: 5.5

## 2015-05-19 LAB — BASIC METABOLIC PANEL
BUN: 12 mg/dL (ref 6–23)
CALCIUM: 9.6 mg/dL (ref 8.4–10.5)
CHLORIDE: 96 meq/L (ref 96–112)
CO2: 28 meq/L (ref 19–32)
Creatinine, Ser: 0.87 mg/dL (ref 0.40–1.20)
GFR: 66.42 mL/min (ref >60.00)
GLUCOSE: 109 mg/dL — AB (ref 70–99)
Potassium: 4.1 mEq/L (ref 3.5–5.1)
SODIUM: 133 meq/L — AB (ref 135–145)

## 2015-05-19 LAB — HEMOGLOBIN A1C: Hgb A1c MFr Bld: 5.6 % (ref 4.6–6.5)

## 2015-05-19 NOTE — Progress Notes (Signed)
Pre visit review using our clinic review tool, if applicable. No additional management support is needed unless otherwise documented below in the visit note. 

## 2015-05-19 NOTE — Progress Notes (Signed)
Patient ID: Sandy Merritt, female    DOB: 1932-05-24  Age: 81 y.o. MRN: [id number]  CC: Urinary Frequency   HPI Sandy Merritt presents for CC of 2 weeks of Nocturia.   1) 2 weeks ago noticed she has to wake up to urinate. She has to go soon after and she feels like it is a large amount. This is a reported change from previous. 3 am   Coffee- 1 cup stopped 2-3 days  Water 7-8 glasses Stops drinking after supper 6:30 pm   Drinks 1 glass 9-10 pm.  Wakes up at 3 am to urinate 3-4 x at night  Orthopnea- Denies  Leg Swelling- denies   Drinking more fluids? Yes  Sweating at night over the last 2 weeks.     History Sandy Merritt has a past medical history of Chicken pox; Headaches, cluster; GERD (gastroesophageal reflux disease); Glaucoma; Urine incontinence; Arthritis; and Lupus ([place]).   She has past surgical history that includes Cholecystectomy (1970); Appendectomy (1970); Tonsillectomy and adenoidectomy (1955); Abdominal hysterectomy; Vaginal delivery; Cesarean section; Foot surgery (years ago); and Cervical spine surgery.   Her family history includes Arthritis in her father and mother; Diabetes in her grandchild and mother; Drug abuse in her maternal aunt; Heart attack in her father; Heart disease in her father and mother. There is no history of Colon cancer.She reports that she has never smoked. She has never used smokeless tobacco. She reports that she does not drink alcohol or use illicit drugs.  Outpatient Prescriptions Prior to Visit  Medication Sig Dispense Refill  . Apoaequorin (PREVAGEN PO) Take by mouth.    Marland Kitchen aspirin 81 MG tablet Take 81 mg by mouth as needed for pain.    Marland Kitchen conjugated estrogens (PREMARIN) vaginal cream Place 1 Applicatorful vaginally 2 (two) times a week. 42.5 g 6  . cyclobenzaprine (FLEXERIL) 5 MG tablet Take 1 tablet (5 mg total) by mouth 3 (three) times daily as needed for muscle spasms. 30 tablet 1  . esomeprazole (NEXIUM) 40 MG capsule TAKE ONE CAPSULE  BY MOUTH DAILY BEFORE BREAKFAST 30 capsule 11  . estrogens, conjugated, (PREMARIN) 0.45 MG tablet Take 1 tablet (0.45 mg total) by mouth daily. 30 tablet 11  . Eszopiclone 3 MG TABS TAKE 1 TABLET BY MOUTH DAILY AT BEDTIME 30 tablet 3  . hydroxychloroquine (PLAQUENIL) 200 MG tablet Take 1 tablet (200 mg total) by mouth 2 (two) times daily. 180 tablet 2  . LYRICA 150 MG capsule TAKE 1 CAPSULE BY MOUTH 2 TIMES A DAY 60 capsule 1  . meclizine (ANTIVERT) 25 MG tablet Take 1 tablet (25 mg total) by mouth 3 (three) times daily as needed for dizziness. 30 tablet 0  . Multiple Vitamins-Minerals (MULTIVITAMIN WITH MINERALS) tablet Take 1 tablet by mouth daily.    Marland Kitchen nystatin ointment (MYCOSTATIN) Apply 1 application topically as needed.     . traZODone (DESYREL) 50 MG tablet TAKE 0.5-1 TABLETS (25-50 MG TOTAL) BY MOUTH AT BEDTIME AS NEEDED FOR SLEEP. 30 tablet 3  . triamcinolone cream (KENALOG) 0.1 % APPLY TO AFFECTED AREA TWICE A DAY 30 g 0   No facility-administered medications prior to visit.    ROS Review of Systems  Constitutional: Positive for diaphoresis. Negative for fever, chills and fatigue.  Endocrine: Positive for polydipsia and polyuria. Negative for polyphagia.  Genitourinary: Positive for dysuria, urgency and frequency. Negative for hematuria, flank pain, decreased urine volume, difficulty urinating and pelvic pain.  Skin: Negative for rash.    Objective:  BP 112/82 mmHg  Pulse 66  Temp(Src) 97.9 F (36.6 C)  Resp 12  Ht 5' (1.524 m)  Wt 129 lb (58.514 kg)  BMI 25.19 kg/m2  SpO2 98%  Physical Exam  Constitutional: She is oriented to person, place, and time. She appears well-developed and well-nourished. No distress.  HENT:  Head: Normocephalic and atraumatic.  Right Ear: External ear normal.  Left Ear: External ear normal.  Abdominal: There is no CVA tenderness.  Neurological: She is alert and oriented to person, place, and time.  Skin: Skin is warm and dry. No rash  noted. She is not diaphoretic.  Psychiatric: She has a normal mood and affect. Her behavior is normal. Judgment and thought content normal.   Assessment & Plan:   Sandy Merritt was seen today for urinary frequency.  Diagnoses and all orders for this visit:  Urinary frequency -     POCT Urinalysis Dipstick -     Urine culture -     Basic Metabolic Panel (BMET) -     Hemoglobin A1c   I am having Sandy Merritt maintain her multivitamin with minerals, aspirin, nystatin ointment, conjugated estrogens, triamcinolone cream, Apoaequorin (PREVAGEN PO), estrogens (conjugated), hydroxychloroquine, esomeprazole, meclizine, cyclobenzaprine, traZODone, LYRICA, and Eszopiclone.  No orders of the defined types were placed in this encounter.     Follow-up: Return if symptoms worsen or fail to improve.

## 2015-05-19 NOTE — Patient Instructions (Signed)
We will contact you with results. If everything is normal we will call you with next steps

## 2015-05-22 LAB — URINE CULTURE: Colony Count: >=100000

## 2015-05-23 ENCOUNTER — Other Ambulatory Visit: Payer: Self-pay | Admitting: Internal Medicine

## 2015-05-24 ENCOUNTER — Encounter: Payer: Self-pay | Admitting: Nurse Practitioner

## 2015-05-24 ENCOUNTER — Other Ambulatory Visit: Payer: Self-pay | Admitting: Nurse Practitioner

## 2015-05-24 DIAGNOSIS — R350 Frequency of micturition: Secondary | ICD-10-CM | POA: Insufficient documentation

## 2015-05-24 MED ORDER — NITROFURANTOIN MONOHYD MACRO 100 MG PO CAPS: 100.0000 mg | ORAL_CAPSULE | Freq: Two times a day (BID) | ORAL | Status: DC

## 2015-05-24 NOTE — Assessment & Plan Note (Signed)
Will continue to follow up with her since this is new onset. POCT urine unconvincing for UTI, will obtain culture, A1C and BMET today. Will follow

## 2015-05-25 ENCOUNTER — Telehealth: Payer: Self-pay | Admitting: *Deleted

## 2015-05-25 NOTE — Telephone Encounter (Signed)
Patient has requested a call back on results for her urine specimen results.

## 2015-05-25 NOTE — Telephone Encounter (Signed)
Refill on Lyrica

## 2015-05-27 NOTE — Telephone Encounter (Signed)
No answer or vm to leave message. 

## 2015-06-01 ENCOUNTER — Ambulatory Visit (INDEPENDENT_AMBULATORY_CARE_PROVIDER_SITE_OTHER): Payer: Medicare Other | Admitting: Nurse Practitioner

## 2015-06-01 VITALS — BP 132/78 | HR 73 | Temp 98.1°F | Resp 12 | Ht 60.0 in | Wt 130.2 lb

## 2015-06-01 DIAGNOSIS — R410 Disorientation, unspecified: Secondary | ICD-10-CM

## 2015-06-01 DIAGNOSIS — F05 Delirium due to known physiological condition: Secondary | ICD-10-CM | POA: Diagnosis not present

## 2015-06-01 DIAGNOSIS — R110 Nausea: Secondary | ICD-10-CM

## 2015-06-01 DIAGNOSIS — R21 Rash and other nonspecific skin eruption: Secondary | ICD-10-CM | POA: Diagnosis not present

## 2015-06-01 MED ORDER — POLYETHYLENE GLYCOL 3350 17 GM/SCOOP PO POWD: 17.0000 g | Freq: Every day | ORAL | Status: DC

## 2015-06-01 MED ORDER — TRIAMCINOLONE ACETONIDE 0.1 % EX CREA: TOPICAL_CREAM | Freq: Two times a day (BID) | CUTANEOUS | Status: DC

## 2015-06-01 MED ORDER — NYSTATIN 100000 UNIT/GM EX OINT: 1.0000 "application " | TOPICAL_OINTMENT | Freq: Two times a day (BID) | CUTANEOUS | Status: DC

## 2015-06-01 NOTE — Patient Instructions (Addendum)
Use both creams on the red area on your right side twice daily for 2 weeks (thin layers) Colace (Docusate Sodium) helpful and found over the counter for stool softner    Constipation, Adult Constipation is when a person has fewer than three bowel movements a week, has difficulty having a bowel movement, or has stools that are dry, hard, or larger than normal. As people grow older, constipation is more common. A low-fiber diet, not taking in enough fluids, and taking certain medicines may make constipation worse.  CAUSES   Certain medicines, such as antidepressants, pain medicine, iron supplements, antacids, and water pills.   Certain diseases, such as diabetes, irritable bowel syndrome (IBS), thyroid disease, or depression.   Not drinking enough water.   Not eating enough fiber-rich foods.   Stress or travel.   Lack of physical activity or exercise.   Ignoring the urge to have a bowel movement.   Using laxatives too much.  SIGNS AND SYMPTOMS   Having fewer than three bowel movements a week.   Straining to have a bowel movement.   Having stools that are hard, dry, or larger than normal.   Feeling full or bloated.   Pain in the lower abdomen.   Not feeling relief after having a bowel movement.  DIAGNOSIS  Your health care provider will take a medical history and perform a physical exam. Further testing may be done for severe constipation. Some tests may include:  A barium enema X-ray to examine your rectum, colon, and, sometimes, your small intestine.   A sigmoidoscopy to examine your lower colon.   A colonoscopy to examine your entire colon. TREATMENT  Treatment will depend on the severity of your constipation and what is causing it. Some dietary treatments include drinking more fluids and eating more fiber-rich foods. Lifestyle treatments may include regular exercise. If these diet and lifestyle recommendations do not help, your health care provider may  recommend taking over-the-counter laxative medicines to help you have bowel movements. Prescription medicines may be prescribed if over-the-counter medicines do not work.  HOME CARE INSTRUCTIONS   Eat foods that have a lot of fiber, such as fruits, vegetables, whole grains, and beans.  Limit foods high in fat and processed sugars, such as french fries, hamburgers, cookies, candies, and soda.   A fiber supplement may be added to your diet if you cannot get enough fiber from foods.   Drink enough fluids to keep your urine clear or pale yellow.   Exercise regularly or as directed by your health care provider.   Go to the restroom when you have the urge to go. Do not hold it.   Only take over-the-counter or prescription medicines as directed by your health care provider. Do not take other medicines for constipation without talking to your health care provider first.  [place] IF:   You have bright red blood in your stool.   Your constipation lasts for more than 4 days or gets worse.   You have abdominal or rectal pain.   You have thin, pencil-like stools.   You have unexplained weight loss. MAKE SURE YOU:   Understand these instructions.  Will watch your condition.  Will get help right away if you are not doing well or get worse.   This information is not intended to replace advice given to you by your health care provider. Make sure you discuss any questions you have with your health care provider.   Document Released: 04/01/2004 Document  Revised: 07/25/2014 Document Reviewed: 04/15/2013 Elsevier Interactive Patient Education [employer].

## 2015-06-01 NOTE — Progress Notes (Signed)
Pre visit review using our clinic review tool, if applicable. No additional management support is needed unless otherwise documented below in the visit note. 

## 2015-06-01 NOTE — Progress Notes (Signed)
Patient ID: Sandy Merritt, female    DOB: 1931-11-11  Age: 81 y.o. MRN: [id number]  CC: Constipation   HPI Sandy Merritt presents for constipation x 1 week approx. Patient is a poor historian. She is not accompanied today.   1) Week and half of no bowel movements (this kept changing in duration throughout her history)  Enemas- 1 week ago she stated and was helpful  Milk of magnesia- one day, helpful  "Getting worse" recently she reports Burping increased, decreased appetite, nausea  Drinking water daily   She denies taking Meclizine recently or Flexeril. She does seem confused about her medications today.   The following medications have constipation as a side effect: Meclizine nexium Flexeril lyrica Trazodone  2) Patient mentions a right arm rash before leaving the room. She did not remember taking kenalog in the past. Unsure of how long it has been there or of any changes.   History Sandy Merritt has a past medical history of Chicken pox; Headaches, cluster; GERD (gastroesophageal reflux disease); Glaucoma; Urine incontinence; Arthritis; and Lupus ([place]).   She has past surgical history that includes Cholecystectomy (1970); Appendectomy (1970); Tonsillectomy and adenoidectomy (1955); Abdominal hysterectomy; Vaginal delivery; Cesarean section; Foot surgery (years ago); and Cervical spine surgery.   Her family history includes Arthritis in her father and mother; Diabetes in her grandchild and mother; Drug abuse in her maternal aunt; Heart attack in her father; Heart disease in her father and mother. There is no history of Colon cancer.She reports that she has never smoked. She has never used smokeless tobacco. She reports that she does not drink alcohol or use illicit drugs.  Outpatient Prescriptions Prior to Visit  Medication Sig Dispense Refill  . Apoaequorin (PREVAGEN PO) Take by mouth.    Marland Kitchen aspirin 81 MG tablet Take 81 mg by mouth as needed for pain.    Marland Kitchen conjugated estrogens  (PREMARIN) vaginal cream Place 1 Applicatorful vaginally 2 (two) times a week. 42.5 g 6  . esomeprazole (NEXIUM) 40 MG capsule TAKE ONE CAPSULE BY MOUTH DAILY BEFORE BREAKFAST 30 capsule 11  . estrogens, conjugated, (PREMARIN) 0.45 MG tablet Take 1 tablet (0.45 mg total) by mouth daily. 30 tablet 11  . Eszopiclone 3 MG TABS TAKE 1 TABLET BY MOUTH DAILY AT BEDTIME 30 tablet 3  . hydroxychloroquine (PLAQUENIL) 200 MG tablet Take 1 tablet (200 mg total) by mouth 2 (two) times daily. 180 tablet 2  . LYRICA 150 MG capsule TAKE 1 CAPSULE BY MOUTH TWICE DAILY 60 capsule 1  . meclizine (ANTIVERT) 25 MG tablet Take 1 tablet (25 mg total) by mouth 3 (three) times daily as needed for dizziness. 30 tablet 0  . Multiple Vitamins-Minerals (MULTIVITAMIN WITH MINERALS) tablet Take 1 tablet by mouth daily.    . traZODone (DESYREL) 50 MG tablet TAKE 0.5-1 TABLETS (25-50 MG TOTAL) BY MOUTH AT BEDTIME AS NEEDED FOR SLEEP. 30 tablet 3  . cyclobenzaprine (FLEXERIL) 5 MG tablet Take 1 tablet (5 mg total) by mouth 3 (three) times daily as needed for muscle spasms. 30 tablet 1  . nitrofurantoin, macrocrystal-monohydrate, (MACROBID) 100 MG capsule Take 1 capsule (100 mg total) by mouth 2 (two) times daily. 14 capsule 0  . nystatin ointment (MYCOSTATIN) Apply 1 application topically as needed.     . triamcinolone cream (KENALOG) 0.1 % APPLY TO AFFECTED AREA TWICE A DAY 30 g 0   No facility-administered medications prior to visit.    ROS Review of Systems  Constitutional: Positive for appetite  change. Negative for fever, chills, diaphoresis, fatigue and unexpected weight change.  Respiratory: Negative for chest tightness, shortness of breath and wheezing.   Cardiovascular: Negative for chest pain, palpitations and leg swelling.  Gastrointestinal: Positive for nausea and constipation. Negative for vomiting, abdominal pain, diarrhea, blood in stool, abdominal distention and anal bleeding.  Skin: Positive for rash.   Neurological: Negative for dizziness, numbness and headaches.    Objective:  BP 132/78 mmHg  Pulse 73  Temp(Src) 98.1 F (36.7 C)  Resp 12  Ht 5' (1.524 m)  Wt 130 lb 3.2 oz (59.058 kg)  BMI 25.43 kg/m2  SpO2 97%  Physical Exam  Constitutional: She is oriented to person, place, and time. She appears well-developed and well-nourished. No distress.  HENT:  Head: Normocephalic and atraumatic.  Right Ear: External ear normal.  Left Ear: External ear normal.  Neck: Normal range of motion.  Abdominal: Soft. Bowel sounds are normal. She exhibits no distension and no mass. There is no tenderness. There is no rebound and no guarding.  Neurological: She is alert and oriented to person, place, and time. No cranial nerve deficit. She exhibits normal muscle tone. Coordination normal.  Oriented to time, place, and person.   Skin: Skin is warm and dry. Rash noted. She is not diaphoretic.  Rash to right forearm- erythematous and patchy  Psychiatric: She has a normal mood and affect. Her behavior is normal. Judgment normal.  Confused about time frames of her complaints today   Assessment & Plan:   Sandy Merritt was seen today for constipation.  Diagnoses and all orders for this visit:  Subacute confusional state  Nausea  Rash and nonspecific skin eruption  Other orders -     triamcinolone cream (KENALOG) 0.1 %; Apply topically 2 (two) times daily. -     nystatin ointment (MYCOSTATIN); Apply 1 application topically 2 (two) times daily. -     polyethylene glycol powder (GLYCOLAX/MIRALAX) powder; Take 17 g by mouth daily.   I have discontinued Sandy Merritt's cyclobenzaprine and nitrofurantoin (macrocrystal-monohydrate). I have also changed her triamcinolone cream and nystatin ointment. Additionally, I am having her start on polyethylene glycol powder. Lastly, I am having her maintain her multivitamin with minerals, aspirin, conjugated estrogens, Apoaequorin (PREVAGEN PO), estrogens (conjugated),  hydroxychloroquine, esomeprazole, meclizine, traZODone, Eszopiclone, and LYRICA.  Meds ordered this encounter  Medications  . triamcinolone cream (KENALOG) 0.1 %    Sig: Apply topically 2 (two) times daily.    Dispense:  30 g    Refill:  0    Order Specific Question:  Supervising Provider    Answer:  Deborra Medina L [[id number]]  . nystatin ointment (MYCOSTATIN)    Sig: Apply 1 application topically 2 (two) times daily.    Dispense:  30 g    Refill:  0    Order Specific Question:  Supervising Provider    Answer:  Deborra Medina L [[id number]]  . polyethylene glycol powder (GLYCOLAX/MIRALAX) powder    Sig: Take 17 g by mouth daily.    Dispense:  250 g    Refill:  1    Order Specific Question:  Supervising Provider    Answer:  Crecencio Mc [[id number]]     Follow-up: Return in about 4 weeks (around 06/29/2015) for Dr. Gilford Rile for follow up of constipation.

## 2015-06-09 ENCOUNTER — Encounter: Payer: Self-pay | Admitting: Nurse Practitioner

## 2015-06-09 DIAGNOSIS — R21 Rash and other nonspecific skin eruption: Secondary | ICD-10-CM | POA: Insufficient documentation

## 2015-06-09 NOTE — Assessment & Plan Note (Signed)
Rash of unknown etiology. Pt had kenalog and nystatin ointment and cream on list. Refilled both and asked her to put a small layer on twice daily for 2 weeks. FU w/ Dr. Gilford Rile in 4 weeks.

## 2015-06-09 NOTE — Assessment & Plan Note (Addendum)
Mild confusion surrounding timing of symptoms today. She contradicts herself when telling her history and seems a little confused about medications she is taking and has not taken in awhile. Asked her to FU with Dr. Gilford Rile in 4 weeks. Pt still Oriented x 3. Do not believe this is an acute change.

## 2015-06-09 NOTE — Assessment & Plan Note (Signed)
Still mild intermittent nausea with increase in eructation. Asked pt to try miralax for help with bowel movements, increase water intake, increase fiber intake, add stool softner Colace to regimen. FU with Dr. Gilford Rile regarding constipation. Told pt : Do not use enemas at this time.

## 2015-06-15 ENCOUNTER — Other Ambulatory Visit: Payer: Self-pay

## 2015-06-15 DIAGNOSIS — Z1231 Encounter for screening mammogram for malignant neoplasm of breast: Secondary | ICD-10-CM

## 2015-06-17 ENCOUNTER — Encounter (HOSPITAL_COMMUNITY): Payer: Self-pay | Admitting: Emergency Medicine

## 2015-06-17 ENCOUNTER — Emergency Department (HOSPITAL_COMMUNITY)
Admission: EM | Admit: 2015-06-17 | Discharge: 2015-06-17 | Disposition: A | Discharge status: Home / Self Care | Payer: Medicare Other | Attending: Emergency Medicine | Admitting: Emergency Medicine

## 2015-06-17 ENCOUNTER — Ambulatory Visit (INDEPENDENT_AMBULATORY_CARE_PROVIDER_SITE_OTHER): Payer: Medicare Other | Admitting: Family Medicine

## 2015-06-17 ENCOUNTER — Encounter: Payer: Self-pay | Admitting: Family Medicine

## 2015-06-17 ENCOUNTER — Encounter: Payer: Self-pay | Admitting: *Deleted

## 2015-06-17 ENCOUNTER — Emergency Department (HOSPITAL_COMMUNITY): Payer: Medicare Other

## 2015-06-17 VITALS — BP 118/64 | HR 97 | Temp 98.7°F | Ht 60.0 in | Wt 127.4 lb

## 2015-06-17 DIAGNOSIS — Z8619 Personal history of other infectious and parasitic diseases: Secondary | ICD-10-CM | POA: Diagnosis not present

## 2015-06-17 DIAGNOSIS — J189 Pneumonia, unspecified organism: Secondary | ICD-10-CM

## 2015-06-17 DIAGNOSIS — Z7952 Long term (current) use of systemic steroids: Secondary | ICD-10-CM | POA: Insufficient documentation

## 2015-06-17 DIAGNOSIS — Z8669 Personal history of other diseases of the nervous system and sense organs: Secondary | ICD-10-CM | POA: Insufficient documentation

## 2015-06-17 DIAGNOSIS — Z7982 Long term (current) use of aspirin: Secondary | ICD-10-CM | POA: Diagnosis not present

## 2015-06-17 DIAGNOSIS — R9431 Abnormal electrocardiogram [ECG] [EKG]: Secondary | ICD-10-CM | POA: Diagnosis not present

## 2015-06-17 DIAGNOSIS — Z79899 Other long term (current) drug therapy: Secondary | ICD-10-CM | POA: Insufficient documentation

## 2015-06-17 DIAGNOSIS — M1990 Unspecified osteoarthritis, unspecified site: Secondary | ICD-10-CM | POA: Insufficient documentation

## 2015-06-17 DIAGNOSIS — R05 Cough: Secondary | ICD-10-CM | POA: Diagnosis present

## 2015-06-17 DIAGNOSIS — K219 Gastro-esophageal reflux disease without esophagitis: Secondary | ICD-10-CM | POA: Insufficient documentation

## 2015-06-17 DIAGNOSIS — J159 Unspecified bacterial pneumonia: Secondary | ICD-10-CM | POA: Diagnosis not present

## 2015-06-17 DIAGNOSIS — R0789 Other chest pain: Secondary | ICD-10-CM

## 2015-06-17 LAB — CBC
HCT: 33.6 % — ABNORMAL LOW (ref 36.0–46.0)
Hemoglobin: 11.1 g/dL — ABNORMAL LOW (ref 12.0–15.0)
MCH: 27.9 pg (ref 26.0–34.0)
MCHC: 33 g/dL (ref 30.0–36.0)
MCV: 84.4 fL (ref 78.0–100.0)
PLATELETS: 305 10*3/uL (ref 150–400)
RBC: 3.98 MIL/uL (ref 3.87–5.11)
RDW: 14 % (ref 11.5–15.5)
WBC: 9.2 10*3/uL (ref 4.0–10.5)

## 2015-06-17 LAB — BASIC METABOLIC PANEL
Anion gap: 8 (ref 5–15)
BUN: 12 mg/dL (ref 6–20)
CALCIUM: 8.7 mg/dL — AB (ref 8.9–10.3)
CO2: 28 mmol/L (ref 22–32)
CREATININE: 0.76 mg/dL (ref 0.44–1.00)
Chloride: 101 mmol/L (ref 101–111)
GLUCOSE: 124 mg/dL — AB (ref 65–99)
Potassium: 3.6 mmol/L (ref 3.5–5.1)
Sodium: 137 mmol/L (ref 135–145)

## 2015-06-17 LAB — I-STAT TROPONIN, ED: TROPONIN I, POC: 0.02 ng/mL (ref 0.00–0.08)

## 2015-06-17 MED ORDER — BENZONATATE 100 MG PO CAPS: 200.0000 mg | ORAL_CAPSULE | Freq: Two times a day (BID) | ORAL | Status: DC | PRN

## 2015-06-17 MED ORDER — AZITHROMYCIN 250 MG PO TABS: 250.0000 mg | ORAL_TABLET | Freq: Every day | ORAL | Status: DC

## 2015-06-17 MED ORDER — DEXTROSE 5 % IV SOLN
1.0000 g | Freq: Once | INTRAVENOUS | Status: AC
  Administered 2015-06-17: 1 g via INTRAVENOUS
  Filled 2015-06-17: qty 10

## 2015-06-17 MED ORDER — CEPHALEXIN 500 MG PO CAPS: 500.0000 mg | ORAL_CAPSULE | Freq: Four times a day (QID) | ORAL | Status: DC

## 2015-06-17 MED ORDER — DEXTROSE 5 % IV SOLN
500.0000 mg | Freq: Once | INTRAVENOUS | Status: AC
  Administered 2015-06-17: 500 mg via INTRAVENOUS
  Filled 2015-06-17: qty 500

## 2015-06-17 NOTE — ED Provider Notes (Signed)
CSN: [id number]     Arrival date & time 06/17/15  [id number] History   First MD Initiated Contact with Patient 06/17/15 1005     Chief Complaint  Patient presents with  . Cough  . Abnormal ECG     (Consider location/radiation/quality/duration/timing/severity/associated sxs/prior Treatment) HPI Comments: Patient with history of GERD, lupus, and rheumatoid arthritis presents to the emergency department with chief complaint of chest pain times several weeks. She states that the symptoms are intermittent. She does not associate the onset with exertion. She denies any associated shortness of breath or radiating symptoms. She states that she has had a productive cough 2 months. She denies any fevers or chills. She was seen by her doctor today, and sent to the emergency department because of reported EKG changes. She denies any pain presently. She has not taken anything for her symptoms today.  The history is provided by the patient. No language interpreter was used.    Past Medical History  Diagnosis Date  . Chicken pox   . Headaches, cluster   . GERD (gastroesophageal reflux disease)   . Glaucoma   . Urine incontinence   . Arthritis     rheumatoid  . Lupus [facility])    Past Surgical History  Procedure Laterality Date  . Cholecystectomy  1970  . Appendectomy  1970  . Tonsillectomy and adenoidectomy  1955  . Abdominal hysterectomy    . Vaginal delivery      1  . Cesarean section      2  . Foot surgery  years ago    "toes"/"heart stopped during surgery" per pt.only(not medical staff)  . Cervical spine surgery      ant. neck/done x2/ screws out now   Family History  Problem Relation Age of Onset  . Arthritis Mother   . Diabetes Mother   . Heart disease Mother   . Arthritis Father   . Heart disease Father   . Heart attack Father   . Diabetes Grandchild   . Drug abuse Maternal Aunt   . Colon cancer Neg Hx    Social History  Substance Use Topics  . Smoking status: Never Smoker    . Smokeless tobacco: Never Used  . Alcohol Use: No   OB History    No data available     Review of Systems  Constitutional: Negative for fever and chills.  Respiratory: Positive for cough. Negative for shortness of breath.   Cardiovascular: Positive for chest pain.  Gastrointestinal: Negative for nausea, vomiting, diarrhea and constipation.  Genitourinary: Negative for dysuria.  All other systems reviewed and are negative.     Allergies  Codeine; Propoxyphene; Propoxyphene hcl; and Propoxyphene n-acetaminophen  Home Medications   Prior to Admission medications   Medication Sig Start Date End Date Taking? Authorizing Provider  Apoaequorin (PREVAGEN PO) Take by mouth.    Historical Provider, MD  aspirin 81 MG tablet Take 81 mg by mouth as needed for pain.    Historical Provider, MD  conjugated estrogens (PREMARIN) vaginal cream Place 1 Applicatorful vaginally 2 (two) times a week. 12/12/13   [name], MD  esomeprazole (NEXIUM) 40 MG capsule TAKE ONE CAPSULE BY MOUTH DAILY BEFORE BREAKFAST 07/31/14   [name], MD  estrogens, conjugated, (PREMARIN) 0.45 MG tablet Take 1 tablet (0.45 mg total) by mouth daily. 07/31/14   [name], MD  Eszopiclone 3 MG TABS TAKE 1 TABLET BY MOUTH DAILY AT BEDTIME 05/06/15   [name], MD  hydroxychloroquine (PLAQUENIL) 200 MG tablet Take 1 tablet (200 mg total) by mouth 2 (two) times daily. 07/31/14   [name], MD  LYRICA 150 MG capsule TAKE 1 CAPSULE BY MOUTH TWICE DAILY 05/25/15   [name], MD  meclizine (ANTIVERT) 25 MG tablet Take 1 tablet (25 mg total) by mouth 3 (three) times daily as needed for dizziness. 10/28/14   [name], MD  Multiple Vitamins-Minerals (MULTIVITAMIN WITH MINERALS) tablet Take 1 tablet by mouth daily.    Historical Provider, MD  nystatin ointment (MYCOSTATIN) Apply 1 application topically 2 (two) times daily. 06/01/15   Rubbie Battiest, NP  polyethylene glycol powder  (GLYCOLAX/MIRALAX) powder Take 17 g by mouth daily. 06/01/15   Rubbie Battiest, NP  traZODone (DESYREL) 50 MG tablet TAKE 0.5-1 TABLETS (25-50 MG TOTAL) BY MOUTH AT BEDTIME AS NEEDED FOR SLEEP. 11/24/14   [name], MD  triamcinolone cream (KENALOG) 0.1 % Apply topically 2 (two) times daily. 06/01/15   Rubbie Battiest, NP   BP 126/72 mmHg  Pulse 78  Temp(Src) 98.9 F (37.2 C) (Oral)  Resp 11  Ht 4\' 11"  (1.499 m)  Wt 57.607 kg  BMI 25.64 kg/m2  SpO2 100% Physical Exam  Constitutional: She is oriented to person, place, and time. She appears well-developed and well-nourished.  HENT:  Head: Normocephalic and atraumatic.  Eyes: Conjunctivae and EOM are normal. Pupils are equal, round, and reactive to light.  Neck: Normal range of motion. Neck supple.  Cardiovascular: Normal rate and regular rhythm.  Exam reveals no gallop and no friction rub.   No murmur heard. Pulmonary/Chest: Effort normal and breath sounds normal. No respiratory distress. She has no wheezes. She has no rales. She exhibits no tenderness.  Abdominal: Soft. Bowel sounds are normal. She exhibits no distension and no mass. There is no tenderness. There is no rebound and no guarding.  Musculoskeletal: Normal range of motion. She exhibits no edema or tenderness.  Neurological: She is alert and oriented to person, place, and time.  Skin: Skin is warm and dry.  Psychiatric: She has a normal mood and affect. Her behavior is normal. Judgment and thought content normal.  Nursing note and vitals reviewed.   ED Course  Procedures (including critical care time) Results for orders placed or performed during the hospital encounter of 06/17/15  CBC  Result Value Ref Range   WBC 9.2 4.0 - 10.5 K/uL   RBC 3.98 3.87 - 5.11 MIL/uL   Hemoglobin 11.1 (L) 12.0 - 15.0 g/dL   HCT 33.6 (L) 36.0 - 46.0 %   MCV 84.4 78.0 - 100.0 fL   MCH 27.9 26.0 - 34.0 pg   MCHC 33.0 30.0 - 36.0 g/dL   RDW 14.0 11.5 - 15.5 %   Platelets 305 150 - 400  K/uL  Basic metabolic panel  Result Value Ref Range   Sodium 137 135 - 145 mmol/L   Potassium 3.6 3.5 - 5.1 mmol/L   Chloride 101 101 - 111 mmol/L   CO2 28 22 - 32 mmol/L   Glucose, Bld 124 (H) 65 - 99 mg/dL   BUN 12 6 - 20 mg/dL   Creatinine, Ser 0.76 0.44 - 1.00 mg/dL   Calcium 8.7 (L) 8.9 - 10.3 mg/dL   GFR calc non Af Amer >60 >60 mL/min   GFR calc Af Amer >60 >60 mL/min   Anion gap 8 5 - 15  I-stat troponin, ED  Result Value Ref Range  Troponin i, poc 0.02 0.00 - 0.08 ng/mL   Comment 3           Dg Chest Port 1 View  06/17/2015  CLINICAL DATA:  81 year old female with left chest pain, shortness of breath, cough for 2 months. Fever last night. Initial encounter. EXAM: PORTABLE CHEST 1 VIEW COMPARISON:  05/06/2014 and earlier. FINDINGS: Portable AP semi upright view at 1041 hours. Patchy and confluent right lung base opacity is new. Lung volumes are stable. Mediastinal contours remain normal. Lung parenchyma elsewhere is stable and clear. No pleural effusion identified. No pneumothorax. Partially visible cervical spine hardware. IMPRESSION: Confluent right lung base opacity suspicious for pneumonia in this setting. No pleural effusion identified. Followup PA and lateral chest X-ray is recommended in 3-4 weeks following trial of antibiotic therapy to ensure resolution and exclude underlying malignancy. Electronically Signed   By: Genevie Ann M.D.   On: 06/17/2015 11:15    I have personally reviewed and evaluated these images and lab results as part of my medical decision-making.   EKG Interpretation None      MDM   Final diagnoses:  CAP (community acquired pneumonia)    Patient with chest pain x weeks and cough.  Will check labs and reassess.  Chest x-ray remarkable for right-sided pneumonia, there is no leukocytosis, and patient is afebrile, but the patient has been having significant cough for the past 2 weeks. Additionally she has felt fatigued. Will treat the patient with  azithromycin and Rocephin. Patient stays in a [facility] community, but not nursing home or SNF.  Will treat for CAP.  Patient ambulates with ease maintaining 97-100% pulse oxygenation. Heart rate is 81-87. She is well-appearing. She is stable and ready for discharge.  Patient discussed with Dr. Alvino Chapel, who agrees with plan.  Patient will need to follow-up with PCP in 3 days for recheck.  She will need a repeat CXR in 3-4 weeks to ensure that the pneumonia has cleared.  Discussed this with patient and family.  Written instructions given as well.  Montine Circle, PA-C 06/17/15 [place], MD 06/18/15 [phone]

## 2015-06-17 NOTE — ED Notes (Signed)
Pt ambulated to restroom with steady gait and no c/o dizziness, SOB. Pt maintained HR 81-87 and O2 sats 97-100%.

## 2015-06-17 NOTE — ED Notes (Signed)
Onset 2 months ago intermittent cough sometimes productive yellow green clear. Seen at [facility] today sent to ED for evaluation of EKG changes.

## 2015-06-17 NOTE — Patient Instructions (Signed)
Nice to meet you. You need evaluation the emergency room given changes on your EKG, the fact that you're having chest pain, and that you're coughing blood up. I have advised EMS transport, though you declined this. Please go straight to the emergency room. If you develop chest pain, shortness of breath, lightheadedness, dizziness, numbness, weakness, increasing headache, or any new or change in symptoms please call 911 in route to the emergency room.

## 2015-06-17 NOTE — Progress Notes (Signed)
Patient ID: Sandy Merritt, female   DOB: 12-Jan-1932, 81 y.o.   MRN: [id number]  Tommi Rumps, MD Phone: [phone]  Sandy Merritt is a 81 y.o. female who presents today for same-day visit.  Patient presents today complaining of one month of rhinorrhea, postnasal drip, and cough. She notes she has been coughing up mucus that is mixed with blood for the last month. She denies objective fevers, though notes that she has subjectively felt warm and had chills. She denies shortness of breath. She has had a central chest pain that she states is aching in nature. It occurs only at night when she is lying down. Lasts briefly and improves with getting up and walking around. Does not radiate anywhere. She did have significant diaphoresis with the chest pain last night. She notes her right ear hurts with this. She has had some frontal headache with this. She's not had any numbness, weakness, or vision changes. She does note some soreness in her anterior neck. She does not have any trouble swallowing. She denies history of cardiac issues, blood clots, swelling in her calves or lower extremities, surgeries, and recent long trips. Patient is also on estrogen replacement, though she does not remember the last time she took this.  PMH: nonsmoker.   ROS the history of present illness  Objective  Physical Exam Filed Vitals:   06/17/15 0807  BP: 118/64  Pulse: 97  Temp: 98.7 F (37.1 C)    Physical Exam  Constitutional: She is well-developed, well-nourished, and in no distress.  HENT:  Head: Normocephalic and atraumatic.  Right Ear: External ear normal.  Left Ear: External ear normal.  Mouth/Throat: No oropharyngeal exudate.  Mild posterior oropharynx erythema  Eyes: Conjunctivae are normal. Pupils are equal, round, and reactive to light.  Neck: Neck supple.  Cardiovascular: Normal rate, regular rhythm and normal heart sounds.  Exam reveals no gallop and no friction rub.   No murmur  heard. Pulmonary/Chest: Effort normal and breath sounds normal. No respiratory distress. She has no wheezes. She has no rales.  Musculoskeletal: She exhibits no edema.  Lymphadenopathy:    She has no cervical adenopathy.  Neurological: She is alert.  CN 2-12 intact, 5/5 strength in bilateral biceps, triceps, grip, quads, hamstrings, plantar and dorsiflexion, sensation to light touch intact in bilateral UE and LE, normal gait, 2+ patellar reflexes  Skin: Skin is warm and dry. She is not diaphoretic.   EKG sinus rhythm with rate variation, rate 81, inverted T waves in V3, V4, V5, and V6, T-wave inversions are new in V5 and V6  Assessment/Plan: Please see individual problem list.  Chest pain Patient with 1 month of intermittent chest pain with cough productive of sputum and bright red blood. EKG today reveals new T-wave inversions in V5 and V6 and increased T-wave inversions in V3 and V4. Concern in this case includes PE and cardiac cause of chest pain. Wells score is 1, though patient is reportedly on estrogen replacement making PE a possibility. Could also be related to bronchitis or pneumonia. Cough and blood in her mucus could be related to allergic rhinitis, though this would not cause chest pain and diaphoresis. Given chest pain and cough productive of blood with EKG changes it was felt most prudent to have the patient evaluated in the emergency room. I advised her that she should take EMS transport, though the patient stated she did not want to be evaluated at [facility] and would transport herself to [facility] for evaluation. She signed AMA paperwork and will transfer herself to the ED. She was given precautions to call EMS while she is in route to the ED.    Orders Placed This Encounter  Procedures  . EKG 12-Lead    Tommi Rumps

## 2015-06-17 NOTE — Progress Notes (Signed)
Pre visit review using our clinic review tool, if applicable. No additional management support is needed unless otherwise documented below in the visit note. 

## 2015-06-17 NOTE — ED Notes (Signed)
Family at bedside. 

## 2015-06-17 NOTE — Assessment & Plan Note (Addendum)
Patient with 1 month of intermittent chest pain with cough productive of sputum and bright red blood. EKG today reveals new T-wave inversions in V5 and V6 and increased T-wave inversions in V3 and V4. Concern in this case includes PE and cardiac cause of chest pain. Wells score is 1, though patient is reportedly on estrogen replacement making PE a possibility. Could also be related to bronchitis or pneumonia. Cough and blood in her mucus could be related to allergic rhinitis, though this would not cause chest pain and diaphoresis. Given chest pain and cough productive of blood with EKG changes it was felt most prudent to have the patient evaluated in the emergency room. I advised her that she should take EMS transport, though the patient stated she did not want to be evaluated at [facility] and would transport herself to [facility] for evaluation. She signed AMA paperwork and will transfer herself to the ED. She was given precautions to call EMS while she is in route to the ED.

## 2015-06-17 NOTE — Discharge Instructions (Signed)

## 2015-06-22 ENCOUNTER — Encounter: Payer: Self-pay | Admitting: Internal Medicine

## 2015-06-22 ENCOUNTER — Ambulatory Visit (INDEPENDENT_AMBULATORY_CARE_PROVIDER_SITE_OTHER): Payer: Medicare Other | Admitting: Internal Medicine

## 2015-06-22 VITALS — BP 107/68 | HR 77 | Temp 97.6°F | Ht 59.0 in | Wt 127.0 lb

## 2015-06-22 DIAGNOSIS — J189 Pneumonia, unspecified organism: Secondary | ICD-10-CM | POA: Diagnosis not present

## 2015-06-22 MED ORDER — BENZONATATE 200 MG PO CAPS: 200.0000 mg | ORAL_CAPSULE | Freq: Two times a day (BID) | ORAL | Status: DC | PRN

## 2015-06-22 NOTE — Progress Notes (Signed)
Subjective:    Patient ID: Sandy Merritt, female    DOB: 1932/05/29, 81 y.o.   MRN: [id number]  HPI  81YO female presents for follow up.  Recently seen in the ED for cough. Diagnosed with pneumonia, after CXR showed RLL opacity. Treated with Azithromycin and Keflex. Compliant with antibiotics. Feeling better. Continues to cough. Taking Tessalon with some improvement. Some subjective chills and sweats at home. Has not checked temp. No dyspnea. No chest pain. Feels tired. Lives at [facility]. Has been getting meals delivered to room.   Wt Readings from Last 3 Encounters:  06/22/15 127 lb (57.607 kg)  06/17/15 127 lb (57.607 kg)  06/17/15 127 lb 6.4 oz (57.788 kg)   BP Readings from Last 3 Encounters:  06/22/15 107/68  06/17/15 126/62  06/17/15 118/64    Past Medical History  Diagnosis Date  . Chicken pox   . Headaches, cluster   . GERD (gastroesophageal reflux disease)   . Glaucoma   . Urine incontinence   . Arthritis     rheumatoid  . Lupus ([place])    Family History  Problem Relation Age of Onset  . Arthritis Mother   . Diabetes Mother   . Heart disease Mother   . Arthritis Father   . Heart disease Father   . Heart attack Father   . Diabetes Grandchild   . Drug abuse Maternal Aunt   . Colon cancer Neg Hx    Past Surgical History  Procedure Laterality Date  . Cholecystectomy  1970  . Appendectomy  1970  . Tonsillectomy and adenoidectomy  1955  . Abdominal hysterectomy    . Vaginal delivery      1  . Cesarean section      2  . Foot surgery  years ago    "toes"/"heart stopped during surgery" per pt.only(not medical staff)  . Cervical spine surgery      ant. neck/done x2/ screws out now   Social History   Social History  . Marital Status: Divorced    Spouse Name: N/A  . Number of Children: N/A  . Years of Education: N/A   Social History Main Topics  . Smoking status: Never Smoker   . Smokeless tobacco: Never Used  . Alcohol Use: No  . Drug  Use: No  . Sexual Activity: Not Asked   Other Topics Concern  . None   Social History Narrative   Lives in [place] alone. Volunteers at [employer]. Divorced.    Review of Systems  Constitutional: Positive for fever, chills and fatigue. Negative for unexpected weight change.  HENT: Negative for congestion, ear discharge, ear pain, facial swelling, hearing loss, mouth sores, nosebleeds, postnasal drip, rhinorrhea, sinus pressure, sneezing, sore throat, tinnitus, trouble swallowing and voice change.   Eyes: Negative for pain, discharge, redness and visual disturbance.  Respiratory: Positive for cough. Negative for chest tightness, shortness of breath, wheezing and stridor.   Cardiovascular: Negative for chest pain, palpitations and leg swelling.  Musculoskeletal: Negative for myalgias, arthralgias, neck pain and neck stiffness.  Skin: Negative for color change and rash.  Neurological: Negative for dizziness, weakness, light-headedness and headaches.  Hematological: Negative for adenopathy.       Objective:    BP 107/68 mmHg  Pulse 77  Temp(Src) 97.6 F (36.4 C) (Oral)  Ht 4\' 11"  (1.499 m)  Wt 127 lb (57.607 kg)  BMI 25.64 kg/m2  SpO2 99% Physical Exam  Constitutional: She is oriented to person, place, and time. She appears  well-developed and well-nourished. No distress.  HENT:  Head: Normocephalic and atraumatic.  Right Ear: External ear normal.  Left Ear: External ear normal.  Nose: Nose normal.  Mouth/Throat: Oropharynx is clear and moist. No oropharyngeal exudate.  Eyes: Conjunctivae are normal. Pupils are equal, round, and reactive to light. Right eye exhibits no discharge. Left eye exhibits no discharge. No scleral icterus.  Neck: Normal range of motion. Neck supple. No tracheal deviation present. No thyromegaly present.  Cardiovascular: Normal rate, regular rhythm, normal heart sounds and intact distal pulses.  Exam reveals no gallop and no friction rub.   No murmur  heard. Pulmonary/Chest: Effort normal and breath sounds normal. No accessory muscle usage. No respiratory distress. She has no decreased breath sounds. She has no wheezes. She has no rhonchi. She has no rales. She exhibits no tenderness.  Musculoskeletal: Normal range of motion. She exhibits no edema or tenderness.  Lymphadenopathy:    She has no cervical adenopathy.  Neurological: She is alert and oriented to person, place, and time. No cranial nerve deficit. She exhibits normal muscle tone. Coordination normal.  Skin: Skin is warm and dry. No rash noted. She is not diaphoretic. No erythema. No pallor.  Psychiatric: She has a normal mood and affect. Her behavior is normal. Judgment and thought content normal.          Assessment & Plan:  Over 71min of which >50% spent in face-to-face contact with patient discussing plan of care  Problem List Items Addressed This Visit      Unprioritized   CAP (community acquired pneumonia) - Primary    Reviewed records from ED visit. Will have pt finish Keflex and Azithromycin. Continue prn Tessalon. Plan for repeat CXR in 2 weeks. Follow up recheck in 1 week and prn.      Relevant Medications   benzonatate (TESSALON) 200 MG capsule       Return in about 1 week (around 06/29/2015) for Recheck.

## 2015-06-22 NOTE — Assessment & Plan Note (Signed)
Reviewed records from ED visit. Will have pt finish Keflex and Azithromycin. Continue prn Tessalon. Plan for repeat CXR in 2 weeks. Follow up recheck in 1 week and prn.

## 2015-06-22 NOTE — Progress Notes (Signed)
Pre visit review using our clinic review tool, if applicable. No additional management support is needed unless otherwise documented below in the visit note. 

## 2015-06-22 NOTE — Patient Instructions (Signed)
Please finish antibiotics.  Continue Tessalon for cough.  We will repeat chest xray in 2 weeks.

## 2015-06-30 ENCOUNTER — Ambulatory Visit: Payer: Medicare Other | Admitting: Internal Medicine

## 2015-06-30 DIAGNOSIS — Z0289 Encounter for other administrative examinations: Secondary | ICD-10-CM

## 2015-07-02 ENCOUNTER — Ambulatory Visit: Payer: Medicare Other

## 2015-07-03 ENCOUNTER — Ambulatory Visit
Admission: RE | Admit: 2015-07-03 | Discharge: 2015-07-03 | Disposition: A | Discharge status: Home / Self Care | Payer: Medicare Other | Source: Ambulatory Visit

## 2015-07-03 DIAGNOSIS — Z1231 Encounter for screening mammogram for malignant neoplasm of breast: Secondary | ICD-10-CM

## 2015-07-07 ENCOUNTER — Encounter: Payer: Self-pay | Admitting: Internal Medicine

## 2015-07-07 ENCOUNTER — Ambulatory Visit (INDEPENDENT_AMBULATORY_CARE_PROVIDER_SITE_OTHER): Payer: Medicare Other | Admitting: Internal Medicine

## 2015-07-07 VITALS — BP 139/77 | HR 62 | Temp 97.7°F | Ht 59.0 in | Wt 128.5 lb

## 2015-07-07 DIAGNOSIS — J189 Pneumonia, unspecified organism: Secondary | ICD-10-CM

## 2015-07-07 DIAGNOSIS — N649 Disorder of breast, unspecified: Secondary | ICD-10-CM | POA: Diagnosis not present

## 2015-07-07 DIAGNOSIS — I10 Essential (primary) hypertension: Secondary | ICD-10-CM

## 2015-07-07 DIAGNOSIS — G4700 Insomnia, unspecified: Secondary | ICD-10-CM | POA: Diagnosis not present

## 2015-07-07 DIAGNOSIS — L988 Other specified disorders of the skin and subcutaneous tissue: Secondary | ICD-10-CM

## 2015-07-07 LAB — COMPREHENSIVE METABOLIC PANEL
ALK PHOS: 48 U/L (ref 39–117)
ALT: 21 U/L (ref 0–35)
AST: 22 U/L (ref 0–37)
Albumin: 3.8 g/dL (ref 3.5–5.2)
BILIRUBIN TOTAL: 0.4 mg/dL (ref 0.2–1.2)
BUN: 16 mg/dL (ref 6–23)
CO2: 28 meq/L (ref 19–32)
Calcium: 9.1 mg/dL (ref 8.4–10.5)
Chloride: 101 mEq/L (ref 96–112)
Creatinine, Ser: 0.88 mg/dL (ref 0.40–1.20)
GFR: 65.53 mL/min (ref >60.00)
GLUCOSE: 87 mg/dL (ref 70–99)
Potassium: 4.8 mEq/L (ref 3.5–5.1)
SODIUM: 139 meq/L (ref 135–145)
TOTAL PROTEIN: 6.9 g/dL (ref 6.0–8.3)

## 2015-07-07 LAB — CBC WITH DIFFERENTIAL/PLATELET
BASOS ABS: 0 10*3/uL (ref 0.0–0.1)
BASOS PCT: 0.7 % (ref 0.0–3.0)
EOS ABS: 0.1 10*3/uL (ref 0.0–0.7)
EOS PCT: 1.5 % (ref 0.0–5.0)
HCT: 36.6 % (ref 36.0–46.0)
Hemoglobin: 11.7 g/dL — ABNORMAL LOW (ref 12.0–15.0)
LYMPHS PCT: 31.6 % (ref 12.0–46.0)
Lymphs Abs: 2 10*3/uL (ref 0.7–4.0)
MCHC: 31.9 g/dL (ref 30.0–36.0)
MCV: 84.6 fl (ref 78.0–100.0)
MONO ABS: 0.5 10*3/uL (ref 0.1–1.0)
Monocytes Relative: 8.5 % (ref 3.0–12.0)
NEUTROS ABS: 3.7 10*3/uL (ref 1.4–7.7)
NEUTROS PCT: 57.7 % (ref 43.0–77.0)
Platelets: 277 10*3/uL (ref 150.0–400.0)
RBC: 4.33 Mil/uL (ref 3.87–5.11)
RDW: 15.1 % (ref 11.5–15.5)
WBC: 6.4 10*3/uL (ref 4.0–10.5)

## 2015-07-07 NOTE — Progress Notes (Signed)
Pre visit review using our clinic review tool, if applicable. No additional management support is needed unless otherwise documented below in the visit note. 

## 2015-07-07 NOTE — Patient Instructions (Addendum)
Please go to [facility] for a follow up chest xray today.  Labs today.  Stop Premarin.  We will set up evaluation with Dermatology for skin lesion on right breast.  Follow up here in 4 weeks or sooner as needed.

## 2015-07-07 NOTE — Assessment & Plan Note (Signed)
Symptoms well controlled with Trazodone. Removed Lunesta from med list. Will continue Trazodone.

## 2015-07-07 NOTE — Assessment & Plan Note (Signed)
Symptoms have resolved. Exam is normal. Will get follow up CXR today.

## 2015-07-07 NOTE — Progress Notes (Signed)
Subjective:    Patient ID: Sandy Merritt, female    DOB: [birth year], 81 y.o.   MRN: [id number]  HPI  81YO female presents for follow up.  Seen 12/5 for follow up of pneumonia. Symptoms were improving at that time.  Feeling well. Only has occasional cough. No fever, chills. Continues to feel tired.  Recently had mammogram which was normal. Concerned about persistent skin lesion on right breast. Present for many months. Seen in past by Dr. Nevada Crane in dermatology and reports biopsy was abnormal. Lesion is not painful. It is red. No changes in size.  Wt Readings from Last 3 Encounters:  07/07/15 128 lb 8 oz (58.287 kg)  06/22/15 127 lb (57.607 kg)  06/17/15 127 lb (57.607 kg)   BP Readings from Last 3 Encounters:  07/07/15 139/77  06/22/15 107/68  06/17/15 126/62    Past Medical History  Diagnosis Date  . Chicken pox   . Headaches, cluster   . GERD (gastroesophageal reflux disease)   . Glaucoma   . Urine incontinence   . Arthritis     rheumatoid  . Lupus ([place])    Family History  Problem Relation Age of Onset  . Arthritis Mother   . Diabetes Mother   . Heart disease Mother   . Arthritis Father   . Heart disease Father   . Heart attack Father   . Diabetes Grandchild   . Drug abuse Maternal Aunt   . Colon cancer Neg Hx    Past Surgical History  Procedure Laterality Date  . Cholecystectomy  1970  . Appendectomy  1970  . Tonsillectomy and adenoidectomy  1955  . Abdominal hysterectomy    . Vaginal delivery      1  . Cesarean section      2  . Foot surgery  years ago    "toes"/"heart stopped during surgery" per pt.only(not medical staff)  . Cervical spine surgery      ant. neck/done x2/ screws out now   Social History   Social History  . Marital Status: Divorced    Spouse Name: N/A  . Number of Children: N/A  . Years of Education: N/A   Social History Main Topics  . Smoking status: Never Smoker   . Smokeless tobacco: Never Used  . Alcohol Use: No  .  Drug Use: No  . Sexual Activity: Not Asked   Other Topics Concern  . None   Social History Narrative   Lives in [place] alone. 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    Review of Systems  Constitutional: Negative for fever, chills, appetite change, fatigue and unexpected weight change.  Eyes: Negative for visual disturbance.  Respiratory: Negative for shortness of breath.   Cardiovascular: Negative for chest pain and leg swelling.  Gastrointestinal: Negative for nausea, vomiting, abdominal pain, diarrhea and constipation.  Musculoskeletal: Negative for myalgias and arthralgias.  Skin: Positive for color change. Negative for rash.  Neurological: Negative for weakness.  Hematological: Negative for adenopathy. Does not bruise/bleed easily.  Psychiatric/Behavioral: Negative for sleep disturbance and dysphoric mood. The patient is not nervous/anxious.        Objective:    BP 139/77 mmHg  Pulse 62  Temp(Src) 97.7 F (36.5 C) (Oral)  Ht 4\' 11"  (1.499 m)  Wt 128 lb 8 oz (58.287 kg)  BMI 25.94 kg/m2  SpO2 96% Physical Exam  Constitutional: She is oriented to person, place, and time. She appears well-developed and well-nourished. No distress.  HENT:  Head: Normocephalic and atraumatic.  Right Ear: External ear normal.  Left Ear: External ear normal.  Nose: Nose normal.  Mouth/Throat: Oropharynx is clear and moist. No oropharyngeal exudate.  Eyes: Conjunctivae are normal. Pupils are equal, round, and reactive to light. Right eye exhibits no discharge. Left eye exhibits no discharge. No scleral icterus.  Neck: Normal range of motion. Neck supple. No tracheal deviation present. No thyromegaly present.  Cardiovascular: Normal rate, regular rhythm, normal heart sounds and intact distal pulses.  Exam reveals no gallop and no friction rub.   No murmur heard. Pulmonary/Chest: Effort normal and breath sounds normal. No accessory muscle usage. No tachypnea. No respiratory distress. She has no  decreased breath sounds. She has no wheezes. She has no rhonchi. She has no rales. She exhibits no tenderness. Right breast exhibits skin change.    Musculoskeletal: Normal range of motion. She exhibits no edema or tenderness.  Lymphadenopathy:    She has no cervical adenopathy.  Neurological: She is alert and oriented to person, place, and time. No cranial nerve deficit. She exhibits normal muscle tone. Coordination normal.  Skin: Skin is warm and dry. No rash noted. She is not diaphoretic. No erythema. No pallor.  Psychiatric: She has a normal mood and affect. Her behavior is normal. Judgment and thought content normal.          Assessment & Plan:   Problem List Items Addressed This Visit      Unprioritized   CAP (community acquired pneumonia) - Primary    Symptoms have resolved. Exam is normal. Will get follow up CXR today.      Relevant Orders   CBC with Differential/Platelet   Comprehensive metabolic panel   DG Chest 2 View   Essential hypertension    BP Readings from Last 3 Encounters:  07/07/15 139/77  06/22/15 107/68  06/17/15 126/62   BP well controlled. Renal function with labs today.      Insomnia    Symptoms well controlled with Trazodone. Removed Lunesta from med list. Will continue Trazodone.      Skin lesion of breast    Right breast skin lesion. Concerning for malignancy given persistence. She reports that area was biopsied in the past. Will try to get these records from Dermatology and will set up follow up evaluation.      Relevant Orders   Ambulatory referral to Dermatology       Return in about 4 weeks (around 08/04/2015) for Recheck.

## 2015-07-07 NOTE — Assessment & Plan Note (Signed)
Right breast skin lesion. Concerning for malignancy given persistence. She reports that area was biopsied in the past. Will try to get these records from Dermatology and will set up follow up evaluation.

## 2015-07-07 NOTE — Assessment & Plan Note (Signed)
BP Readings from Last 3 Encounters:  07/07/15 139/77  06/22/15 107/68  06/17/15 126/62   BP well controlled. Renal function with labs today.

## 2015-07-08 ENCOUNTER — Ambulatory Visit
Admission: RE | Admit: 2015-07-08 | Discharge: 2015-07-08 | Disposition: A | Discharge status: Home / Self Care | Payer: Medicare Other | Source: Ambulatory Visit | Attending: Internal Medicine | Admitting: Internal Medicine

## 2015-07-08 DIAGNOSIS — J449 Chronic obstructive pulmonary disease, unspecified: Secondary | ICD-10-CM | POA: Insufficient documentation

## 2015-07-08 DIAGNOSIS — I517 Cardiomegaly: Secondary | ICD-10-CM | POA: Insufficient documentation

## 2015-07-08 DIAGNOSIS — Z09 Encounter for follow-up examination after completed treatment for conditions other than malignant neoplasm: Secondary | ICD-10-CM | POA: Insufficient documentation

## 2015-07-08 DIAGNOSIS — J189 Pneumonia, unspecified organism: Secondary | ICD-10-CM

## 2015-08-04 ENCOUNTER — Ambulatory Visit
Admission: RE | Admit: 2015-08-04 | Discharge: 2015-08-04 | Disposition: A | Discharge status: Home / Self Care | Payer: Medicare Other | Source: Ambulatory Visit | Attending: Internal Medicine | Admitting: Internal Medicine

## 2015-08-04 ENCOUNTER — Ambulatory Visit (INDEPENDENT_AMBULATORY_CARE_PROVIDER_SITE_OTHER): Payer: Medicare Other | Admitting: Internal Medicine

## 2015-08-04 ENCOUNTER — Encounter: Payer: Self-pay | Admitting: Internal Medicine

## 2015-08-04 VITALS — BP 142/88 | HR 74 | Temp 97.7°F | Ht 59.0 in | Wt 127.2 lb

## 2015-08-04 DIAGNOSIS — L609 Nail disorder, unspecified: Secondary | ICD-10-CM | POA: Diagnosis not present

## 2015-08-04 DIAGNOSIS — C44511 Basal cell carcinoma of skin of breast: Secondary | ICD-10-CM | POA: Diagnosis not present

## 2015-08-04 DIAGNOSIS — Z9889 Other specified postprocedural states: Secondary | ICD-10-CM | POA: Diagnosis not present

## 2015-08-04 DIAGNOSIS — M47812 Spondylosis without myelopathy or radiculopathy, cervical region: Secondary | ICD-10-CM

## 2015-08-04 DIAGNOSIS — L608 Other nail disorders: Secondary | ICD-10-CM

## 2015-08-04 DIAGNOSIS — M542 Cervicalgia: Secondary | ICD-10-CM | POA: Diagnosis not present

## 2015-08-04 DIAGNOSIS — Z981 Arthrodesis status: Secondary | ICD-10-CM | POA: Insufficient documentation

## 2015-08-04 NOTE — Assessment & Plan Note (Signed)
Xray of cervical spine today. We will set up evaluation with Dr. Arnoldo Morale. Continue Lyrica for pain control.

## 2015-08-04 NOTE — Assessment & Plan Note (Signed)
Few thickened toenails right foot. Will refer to podiatry for evaluation.

## 2015-08-04 NOTE — Patient Instructions (Signed)
Please go to [facility] for xray of cervical spine today.  We will set up neurosurgical evaluation with Dr. Arnoldo Morale.

## 2015-08-04 NOTE — Progress Notes (Signed)
Subjective:    Patient ID: Sandy [place], female    DOB: [birth year], 82 y.o.   MRN: [id number]  HPI  82YO female presents for follow up.  Neck pain - history of cervical spine surgery. One of screws came out by her report about 2 years ago. She was told she would need fusion in the future. She does not want fusion. Over last few weeks, having more neck pain. Worse with rotation of head. Taking Lyrica with some improvement.  Also would like to have some toenails removed. Needs podiatry referral.  Brings record of skin biopsy from 2015, showing [place] right axilla/breast. She has not yet followed up with dermatology.  Wt Readings from Last 3 Encounters:  08/04/15 127 lb 4 oz (57.72 kg)  07/07/15 128 lb 8 oz (58.287 kg)  06/22/15 127 lb (57.607 kg)   BP Readings from Last 3 Encounters:  08/04/15 142/88  07/07/15 139/77  06/22/15 107/68    Past Medical History  Diagnosis Date  . Chicken pox   . Headaches, cluster   . GERD (gastroesophageal reflux disease)   . Glaucoma   . Urine incontinence   . Arthritis     rheumatoid  . Lupus ([place])    Family History  Problem Relation Age of Onset  . Arthritis Mother   . Diabetes Mother   . Heart disease Mother   . Arthritis Father   . Heart disease Father   . Heart attack Father   . Diabetes Grandchild   . Drug abuse Maternal Aunt   . Colon cancer Neg Hx    Past Surgical History  Procedure Laterality Date  . Cholecystectomy  1970  . Appendectomy  1970  . Tonsillectomy and adenoidectomy  1955  . Abdominal hysterectomy    . Vaginal delivery      1  . Cesarean section      2  . Foot surgery  years ago    "toes"/"heart stopped during surgery" per pt.only(not medical staff)  . Cervical spine surgery      ant. neck/done x2/ screws out now   Social History   Social History  . Marital Status: Divorced    Spouse Name: N/A  . Number of Children: N/A  . Years of Education: N/A   Social History Main Topics  . Smoking  status: Never Smoker   . Smokeless tobacco: Never Used  . Alcohol Use: No  . Drug Use: No  . Sexual Activity: Not Asked   Other Topics Concern  . None   Social History Narrative   Lives in [place] alone. 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    Review of Systems  Constitutional: Negative for fever, chills, appetite change, fatigue and unexpected weight change.  Eyes: Negative for visual disturbance.  Respiratory: Negative for shortness of breath.   Cardiovascular: Negative for chest pain, palpitations and leg swelling.  Gastrointestinal: Negative for nausea, abdominal pain, diarrhea and constipation.  Musculoskeletal: Positive for myalgias, arthralgias, neck pain and neck stiffness. Negative for back pain and gait problem.  Skin: Negative for color change and rash.  Hematological: Negative for adenopathy. Does not bruise/bleed easily.  Psychiatric/Behavioral: Positive for confusion. Negative for sleep disturbance and dysphoric mood. The patient is not nervous/anxious.        Objective:    BP 142/88 mmHg  Pulse 74  Temp(Src) 97.7 F (36.5 C) (Oral)  Ht 4\' 11"  (1.499 m)  Wt 127 lb 4 oz (57.72 kg)  BMI 25.69 kg/m2  SpO2 98% Physical  Exam  Constitutional: She is oriented to person, place, and time. She appears well-developed and well-nourished. No distress.  HENT:  Head: Normocephalic and atraumatic.  Right Ear: External ear normal.  Left Ear: External ear normal.  Nose: Nose normal.  Mouth/Throat: Oropharynx is clear and moist. No oropharyngeal exudate.  Eyes: Conjunctivae are normal. Pupils are equal, round, and reactive to light. Right eye exhibits no discharge. Left eye exhibits no discharge. No scleral icterus.  Neck: Neck supple. Muscular tenderness present. No spinous process tenderness present. Decreased range of motion (limited by pain) present. No tracheal deviation present. No thyroid mass and no thyromegaly present.    Cardiovascular: Normal rate, regular rhythm,  normal heart sounds and intact distal pulses.  Exam reveals no gallop and no friction rub.   No murmur heard. Pulmonary/Chest: Effort normal and breath sounds normal. No respiratory distress. She has no wheezes. She has no rales. She exhibits no tenderness.  Musculoskeletal: She exhibits no edema or tenderness.  Lymphadenopathy:    She has no cervical adenopathy.  Neurological: She is alert and oriented to person, place, and time. No cranial nerve deficit. She exhibits normal muscle tone. Coordination normal.  Skin: Skin is warm and dry. No rash noted. She is not diaphoretic. No erythema. No pallor.  Psychiatric: She has a normal mood and affect. Her behavior is normal. Judgment and thought content normal.          Assessment & Plan:   Problem List Items Addressed This Visit      Unprioritized   Basal cell carcinoma of skin of breast    Reviewed notes and pathology from dermatology. Follow up with dermatology pending.      Degenerative arthritis of cervical spine - Primary    Xray of cervical spine today. We will set up evaluation with Dr. Arnoldo Morale. Continue Lyrica for pain control.      Relevant Orders   DG Cervical Spine Complete   Ambulatory referral to Neurosurgery   Toenail deformity    Few thickened toenails right foot. Will refer to podiatry for evaluation.      Relevant Orders   Ambulatory referral to Podiatry       Return in about 4 weeks (around 09/01/2015) for Recheck.

## 2015-08-04 NOTE — Assessment & Plan Note (Signed)
Reviewed notes and pathology from dermatology. Follow up with dermatology pending.

## 2015-08-05 ENCOUNTER — Telehealth: Payer: Self-pay | Admitting: *Deleted

## 2015-08-05 NOTE — Telephone Encounter (Signed)
Patient has requested her Xray results for 08/04/15  Contact [phone]

## 2015-08-05 NOTE — Telephone Encounter (Signed)
Called pt with test results, verbalized understanding and stated that Melissa would be calling her with referral setup./tvw

## 2015-08-17 ENCOUNTER — Ambulatory Visit: Payer: Medicare Other | Admitting: Podiatry

## 2015-08-27 ENCOUNTER — Other Ambulatory Visit (HOSPITAL_COMMUNITY): Payer: Self-pay | Admitting: Neurosurgery

## 2015-08-27 DIAGNOSIS — M542 Cervicalgia: Principal | ICD-10-CM

## 2015-08-27 DIAGNOSIS — G8929 Other chronic pain: Secondary | ICD-10-CM

## 2015-09-01 ENCOUNTER — Encounter: Payer: Self-pay | Admitting: Internal Medicine

## 2015-09-01 ENCOUNTER — Ambulatory Visit (INDEPENDENT_AMBULATORY_CARE_PROVIDER_SITE_OTHER): Payer: Medicare Other | Admitting: Internal Medicine

## 2015-09-01 ENCOUNTER — Telehealth: Payer: Self-pay | Admitting: *Deleted

## 2015-09-01 VITALS — BP 126/75 | HR 82 | Temp 97.7°F | Ht 59.0 in | Wt 130.2 lb

## 2015-09-01 DIAGNOSIS — M329 Systemic lupus erythematosus, unspecified: Secondary | ICD-10-CM

## 2015-09-01 DIAGNOSIS — M47812 Spondylosis without myelopathy or radiculopathy, cervical region: Secondary | ICD-10-CM

## 2015-09-01 DIAGNOSIS — N951 Menopausal and female climacteric states: Secondary | ICD-10-CM

## 2015-09-01 DIAGNOSIS — C44511 Basal cell carcinoma of skin of breast: Secondary | ICD-10-CM

## 2015-09-01 DIAGNOSIS — Z23 Encounter for immunization: Secondary | ICD-10-CM

## 2015-09-01 MED ORDER — HYDROXYCHLOROQUINE SULFATE 200 MG PO TABS: 200.0000 mg | ORAL_TABLET | Freq: Two times a day (BID) | ORAL | Status: DC

## 2015-09-01 MED ORDER — TRAZODONE HCL 50 MG PO TABS: ORAL_TABLET | ORAL | Status: DC

## 2015-09-01 MED ORDER — ESTROGENS CONJUGATED 0.3 MG PO TABS: 0.3000 mg | ORAL_TABLET | Freq: Every day | ORAL | Status: DC

## 2015-09-01 MED ORDER — PREGABALIN 150 MG PO CAPS: 150.0000 mg | ORAL_CAPSULE | Freq: Two times a day (BID) | ORAL | Status: DC

## 2015-09-01 NOTE — Assessment & Plan Note (Signed)
Chronic degenerative changes in spine. Followed by Dr. Arnoldo Morale. Will follow up after upcoming imaging complete.

## 2015-09-01 NOTE — Assessment & Plan Note (Signed)
Symptoms well controlled with Plaquenil. Will continue.

## 2015-09-01 NOTE — Progress Notes (Signed)
Subjective:    Patient ID: Sandy Merritt, female    DOB: 05-08-1932, 82 y.o.   MRN: [id number]  HPI  82YO female presents for follow up.  Last seen in January for neck pain. Seen by Dr. Arnoldo Morale. Scheduled for imaging next week.  Feeling the best she has in some time. Continues to have some neck pain.  Had biopsy of lesion on skin on right breast. Pathology pending.  Stopped Premarin about 1 week ago. Having worsening hot flashes. Feeling more depressed. Has tried to get off medication in the past.  Lupus - Symptoms of arthralgia have been well controlled on Plaquenil. Followed by Dr. Jefm Bryant.   Wt Readings from Last 3 Encounters:  09/01/15 130 lb 4 oz (59.081 kg)  08/04/15 127 lb 4 oz (57.72 kg)  07/07/15 128 lb 8 oz (58.287 kg)   BP Readings from Last 3 Encounters:  09/01/15 126/75  08/04/15 142/88  07/07/15 139/77    Past Medical History  Diagnosis Date  . Chicken pox   . Headaches, cluster   . GERD (gastroesophageal reflux disease)   . Glaucoma   . Urine incontinence   . Arthritis     rheumatoid  . Lupus ([place])    Family History  Problem Relation Age of Onset  . Arthritis Mother   . Diabetes Mother   . Heart disease Mother   . Arthritis Father   . Heart disease Father   . Heart attack Father   . Diabetes Grandchild   . Drug abuse Maternal Aunt   . Colon cancer Neg Hx    Past Surgical History  Procedure Laterality Date  . Cholecystectomy  1970  . Appendectomy  1970  . Tonsillectomy and adenoidectomy  1955  . Abdominal hysterectomy    . Vaginal delivery      1  . Cesarean section      2  . Foot surgery  years ago    "toes"/"heart stopped during surgery" per pt.only(not medical staff)  . Cervical spine surgery      ant. neck/done x2/ screws out now   Social History   Social History  . Marital Status: Divorced    Spouse Name: N/A  . Number of Children: N/A  . Years of Education: N/A   Social History Main Topics  . Smoking status:  Never Smoker   . Smokeless tobacco: Never Used  . Alcohol Use: No  . Drug Use: No  . Sexual Activity: Not Asked   Other Topics Concern  . None   Social History Narrative   Lives in [place] alone. 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    Review of Systems  Constitutional: Negative for fever, chills, appetite change, fatigue and unexpected weight change.  Eyes: Negative for visual disturbance.  Respiratory: Negative for shortness of breath.   Cardiovascular: Negative for chest pain and leg swelling.  Gastrointestinal: Negative for nausea, vomiting, abdominal pain, diarrhea and constipation.  Musculoskeletal: Positive for myalgias, back pain, arthralgias and neck pain.  Skin: Negative for color change and rash.  Hematological: Negative for adenopathy. Does not bruise/bleed easily.  Psychiatric/Behavioral: Negative for sleep disturbance and dysphoric mood. The patient is not nervous/anxious.        Objective:    BP 126/75 mmHg  Pulse 82  Temp(Src) 97.7 F (36.5 C) (Oral)  Ht 4\' 11"  (1.499 m)  Wt 130 lb 4 oz (59.081 kg)  BMI 26.29 kg/m2  SpO2 99% Physical Exam  Constitutional: She is oriented to person, place, and  time. She appears well-developed and well-nourished. No distress.  HENT:  Head: Normocephalic and atraumatic.  Right Ear: External ear normal.  Left Ear: External ear normal.  Nose: Nose normal.  Mouth/Throat: Oropharynx is clear and moist. No oropharyngeal exudate.  Eyes: Conjunctivae are normal. Pupils are equal, round, and reactive to light. Right eye exhibits no discharge. Left eye exhibits no discharge. No scleral icterus.  Neck: Normal range of motion. Neck supple. No tracheal deviation present. No thyromegaly present.  Cardiovascular: Normal rate, regular rhythm, normal heart sounds and intact distal pulses.  Exam reveals no gallop and no friction rub.   No murmur heard. Pulmonary/Chest: Effort normal and breath sounds normal. No respiratory distress. She has  no wheezes. She has no rales. She exhibits no tenderness.  Musculoskeletal: Normal range of motion. She exhibits no edema or tenderness.  Lymphadenopathy:    She has no cervical adenopathy.  Neurological: She is alert and oriented to person, place, and time. No cranial nerve deficit. She exhibits normal muscle tone. Coordination normal.  Skin: Skin is warm and dry. No rash noted. She is not diaphoretic. No erythema. No pallor.  Psychiatric: She has a normal mood and affect. Her behavior is normal. Judgment and thought content normal.          Assessment & Plan:   Problem List Items Addressed This Visit      Unprioritized   Basal cell carcinoma of skin of breast    She will bring copy of pathology report from recent biopsy over.      Relevant Medications   pregabalin (LYRICA) 150 MG capsule   Degenerative arthritis of cervical spine - Primary    Chronic degenerative changes in spine. Followed by Dr. Arnoldo Morale. Will follow up after upcoming imaging complete.      Relevant Medications   hydroxychloroquine (PLAQUENIL) 200 MG tablet   Lupus ([place])    Symptoms well controlled with Plaquenil. Will continue.      Menopausal symptoms    Discussed the risks of HRT. She has been on Premarin for over 20 years. She has tried to wean off in the past with severe hot flashes, depression, fatigue. She understands the risks of HRT and would like to continue. Will try lower dose of Premarin.          Return in about 4 weeks (around 09/29/2015) for Recheck.

## 2015-09-01 NOTE — Assessment & Plan Note (Signed)
She will bring copy of pathology report from recent biopsy over.

## 2015-09-01 NOTE — Progress Notes (Signed)
Pre visit review using our clinic review tool, if applicable. No additional management support is needed unless otherwise documented below in the visit note. 

## 2015-09-01 NOTE — Telephone Encounter (Signed)
FYI patient wanted to  FYI Dr. Gilford Rile that she was seen by Dr. Karl Pock at [facility]

## 2015-09-01 NOTE — Patient Instructions (Signed)
Continue current medications.  Flu vaccine today.  Follow up in 4 weeks and sooner as needed.

## 2015-09-01 NOTE — Assessment & Plan Note (Signed)
Discussed the risks of HRT. She has been on Premarin for over 20 years. She has tried to wean off in the past with severe hot flashes, depression, fatigue. She understands the risks of HRT and would like to continue. Will try lower dose of Premarin.

## 2015-09-16 ENCOUNTER — Ambulatory Visit: Payer: Medicare Other

## 2015-09-16 ENCOUNTER — Other Ambulatory Visit: Payer: Medicare Other

## 2015-09-17 ENCOUNTER — Other Ambulatory Visit: Payer: Self-pay | Admitting: Internal Medicine

## 2015-09-21 ENCOUNTER — Other Ambulatory Visit: Payer: Self-pay | Admitting: Nurse Practitioner

## 2015-09-23 ENCOUNTER — Telehealth: Payer: Self-pay

## 2015-09-23 NOTE — Telephone Encounter (Signed)
PA for Premarin started on cover my meds

## 2015-09-24 ENCOUNTER — Other Ambulatory Visit: Payer: Self-pay | Admitting: Internal Medicine

## 2015-09-25 NOTE — Telephone Encounter (Signed)
Both refills and last seen in 08/2015. Please advise?

## 2015-09-25 NOTE — Telephone Encounter (Signed)
Faxed

## 2015-09-25 NOTE — Telephone Encounter (Signed)
Premarin PA approved through 07/17/2016.

## 2015-09-28 ENCOUNTER — Ambulatory Visit
Admission: RE | Admit: 2015-09-28 | Discharge: 2015-09-28 | Disposition: A | Discharge status: Home / Self Care | Payer: Medicare Other | Source: Ambulatory Visit | Attending: Neurosurgery | Admitting: Neurosurgery

## 2015-09-28 DIAGNOSIS — M50321 Other cervical disc degeneration at C4-C5 level: Secondary | ICD-10-CM | POA: Diagnosis not present

## 2015-09-28 DIAGNOSIS — M5033 Other cervical disc degeneration, cervicothoracic region: Secondary | ICD-10-CM | POA: Insufficient documentation

## 2015-09-28 DIAGNOSIS — M542 Cervicalgia: Principal | ICD-10-CM

## 2015-09-28 DIAGNOSIS — Z981 Arthrodesis status: Secondary | ICD-10-CM | POA: Insufficient documentation

## 2015-09-28 DIAGNOSIS — G8929 Other chronic pain: Secondary | ICD-10-CM | POA: Insufficient documentation

## 2015-09-28 DIAGNOSIS — M4802 Spinal stenosis, cervical region: Secondary | ICD-10-CM | POA: Insufficient documentation

## 2015-09-29 ENCOUNTER — Ambulatory Visit (INDEPENDENT_AMBULATORY_CARE_PROVIDER_SITE_OTHER): Payer: Medicare Other | Admitting: Internal Medicine

## 2015-09-29 ENCOUNTER — Encounter: Payer: Self-pay | Admitting: Internal Medicine

## 2015-09-29 VITALS — BP 151/84 | HR 83 | Temp 97.3°F | Ht 59.0 in | Wt 132.4 lb

## 2015-09-29 DIAGNOSIS — R5383 Other fatigue: Secondary | ICD-10-CM | POA: Diagnosis not present

## 2015-09-29 DIAGNOSIS — R079 Chest pain, unspecified: Secondary | ICD-10-CM | POA: Diagnosis not present

## 2015-09-29 LAB — CBC WITH DIFFERENTIAL/PLATELET
Basophils Absolute: 0 10*3/uL (ref 0.0–0.1)
Basophils Relative: 0.5 % (ref 0.0–3.0)
EOS ABS: 0.1 10*3/uL (ref 0.0–0.7)
EOS PCT: 1.5 % (ref 0.0–5.0)
HCT: 35.2 % — ABNORMAL LOW (ref 36.0–46.0)
HEMOGLOBIN: 11.6 g/dL — AB (ref 12.0–15.0)
LYMPHS PCT: 34.4 % (ref 12.0–46.0)
Lymphs Abs: 2.3 10*3/uL (ref 0.7–4.0)
MCHC: 33.1 g/dL (ref 30.0–36.0)
MCV: 82.1 fl (ref 78.0–100.0)
MONO ABS: 0.5 10*3/uL (ref 0.1–1.0)
MONOS PCT: 7.2 % (ref 3.0–12.0)
Neutro Abs: 3.8 10*3/uL (ref 1.4–7.7)
Neutrophils Relative %: 56.4 % (ref 43.0–77.0)
Platelets: 255 10*3/uL (ref 150.0–400.0)
RBC: 4.28 Mil/uL (ref 3.87–5.11)
RDW: 15.5 % (ref 11.5–15.5)
WBC: 6.7 10*3/uL (ref 4.0–10.5)

## 2015-09-29 LAB — COMPREHENSIVE METABOLIC PANEL
ALBUMIN: 4.3 g/dL (ref 3.5–5.2)
ALK PHOS: 47 U/L (ref 39–117)
ALT: 13 U/L (ref 0–35)
AST: 20 U/L (ref 0–37)
BUN: 20 mg/dL (ref 6–23)
CO2: 30 mEq/L (ref 19–32)
CREATININE: 0.89 mg/dL (ref 0.40–1.20)
Calcium: 9.2 mg/dL (ref 8.4–10.5)
Chloride: 100 mEq/L (ref 96–112)
GFR: 64.64 mL/min (ref >60.00)
Glucose, Bld: 97 mg/dL (ref 70–99)
Potassium: 4.2 mEq/L (ref 3.5–5.1)
SODIUM: 138 meq/L (ref 135–145)
TOTAL PROTEIN: 7.6 g/dL (ref 6.0–8.3)
Total Bilirubin: 0.4 mg/dL (ref 0.2–1.2)

## 2015-09-29 LAB — TSH: TSH: 2.46 u[IU]/mL (ref 0.35–4.50)

## 2015-09-29 LAB — VITAMIN B12: VITAMIN B 12: 419 pg/mL (ref 211–911)

## 2015-09-29 NOTE — Assessment & Plan Note (Addendum)
Left sided chest pain and fatigue. EKG today showed no acute changes. Symptoms are atypical for CAD. Labs as ordered. CXR today. Follow up in 4 weeks.

## 2015-09-29 NOTE — Patient Instructions (Signed)
Labs today.  Chest xray today at [facility].   Follow up in 4 weeks or sooner as needed.

## 2015-09-29 NOTE — Progress Notes (Signed)
Pre visit review using our clinic review tool, if applicable. No additional management support is needed unless otherwise documented below in the visit note. 

## 2015-09-29 NOTE — Assessment & Plan Note (Signed)
Recent fatigue. No focal symptoms except for occasional cough and chest pain. EKG today shows no acute changes. CXR ordered. Labs ordered. Follow up 4 weeks.

## 2015-09-29 NOTE — Progress Notes (Signed)
Subjective:    Patient ID: Sandy Merritt, female    DOB: 12/04/1931, 82 y.o.   MRN: [id number]  HPI  82YO female presents for follow up.  Feeling more fatigued over last few weeks. Occasional left sided chest pain. Substernal. Does not radiate. Just lasts for a few seconds. Described as sharp pain. No NV. No diaphoresis. Occasional cough. No dyspnea. No change in bowel habits.   Wt Readings from Last 3 Encounters:  09/29/15 132 lb 6 oz (60.045 kg)  09/01/15 130 lb 4 oz (59.081 kg)  08/04/15 127 lb 4 oz (57.72 kg)   BP Readings from Last 3 Encounters:  09/29/15 151/84  09/01/15 126/75  08/04/15 142/88    Past Medical History  Diagnosis Date  . Chicken pox   . Headaches, cluster   . GERD (gastroesophageal reflux disease)   . Glaucoma   . Urine incontinence   . Arthritis     rheumatoid  . Lupus ([place])    Family History  Problem Relation Age of Onset  . Arthritis Mother   . Diabetes Mother   . Heart disease Mother   . Arthritis Father   . Heart disease Father   . Heart attack Father   . Diabetes Grandchild   . Drug abuse Maternal Aunt   . Colon cancer Neg Hx    Past Surgical History  Procedure Laterality Date  . Cholecystectomy  1970  . Appendectomy  1970  . Tonsillectomy and adenoidectomy  1955  . Abdominal hysterectomy    . Vaginal delivery      1  . Cesarean section      2  . Foot surgery  years ago    "toes"/"heart stopped during surgery" per pt.only(not medical staff)  . Cervical spine surgery      ant. neck/done x2/ screws out now   Social History   Social History  . Marital Status: Divorced    Spouse Name: N/A  . Number of Children: N/A  . Years of Education: N/A   Social History Main Topics  . Smoking status: Never Smoker   . Smokeless tobacco: Never Used  . Alcohol Use: No  . Drug Use: No  . Sexual Activity: Not Asked   Other Topics Concern  . None   Social History Narrative   Lives in [place] alone. Volunteers at [employer].  Divorced.    Review of Systems  Constitutional: Positive for fatigue. Negative for fever, chills, appetite change and unexpected weight change.  Eyes: Negative for visual disturbance.  Respiratory: Negative for cough and shortness of breath.   Cardiovascular: Positive for chest pain. Negative for palpitations and leg swelling.  Gastrointestinal: Negative for nausea, vomiting, abdominal pain, diarrhea and constipation.  Skin: Negative for color change and rash.  Hematological: Negative for adenopathy. Does not bruise/bleed easily.  Psychiatric/Behavioral: Negative for sleep disturbance and dysphoric mood. The patient is not nervous/anxious.        Objective:    BP 151/84 mmHg  Pulse 83  Temp(Src) 97.3 F (36.3 C) (Oral)  Ht 4\' 11"  (1.499 m)  Wt 132 lb 6 oz (60.045 kg)  BMI 26.72 kg/m2  SpO2 97% Physical Exam  Constitutional: She is oriented to person, place, and time. She appears well-developed and well-nourished. No distress.  HENT:  Head: Normocephalic and atraumatic.  Right Ear: External ear normal.  Left Ear: External ear normal.  Nose: Nose normal.  Mouth/Throat: Oropharynx is clear and moist.  Eyes: Conjunctivae are normal. Pupils are equal, round,  and reactive to light. Right eye exhibits no discharge. Left eye exhibits no discharge. No scleral icterus.  Neck: Normal range of motion. Neck supple. No tracheal deviation present. No thyromegaly present.  Cardiovascular: Normal rate, regular rhythm, normal heart sounds and intact distal pulses.  Exam reveals no gallop and no friction rub.   No murmur heard. Pulmonary/Chest: Effort normal and breath sounds normal. No respiratory distress. She has no wheezes. She has no rales. She exhibits no tenderness.  Musculoskeletal: Normal range of motion. She exhibits no edema or tenderness.  Lymphadenopathy:    She has no cervical adenopathy.  Neurological: She is alert and oriented to person, place, and time. No cranial nerve  deficit. She exhibits normal muscle tone. Coordination normal.  Skin: Skin is warm and dry. No rash noted. She is not diaphoretic. No erythema. No pallor.  Psychiatric: She has a normal mood and affect. Her behavior is normal. Judgment and thought content normal.          Assessment & Plan:   Problem List Items Addressed This Visit      Unprioritized   Fatigue    Recent fatigue. No focal symptoms except for occasional cough and chest pain. EKG today shows no acute changes. CXR ordered. Labs ordered. Follow up 4 weeks.      Relevant Orders   TSH   CBC with Differential/Platelet   Comprehensive metabolic panel   123456   Pain in the chest - Primary    Left sided chest pain and fatigue. EKG today showed no acute changes. Symptoms are atypical for CAD. Labs as ordered. CXR today. Follow up in 4 weeks.      Relevant Orders   EKG 12-Lead (Completed)   DG Chest 2 View       Return in about 4 weeks (around 10/27/2015) for Recheck.  Ronette Deter, MD Internal Medicine [facility]

## 2015-11-11 ENCOUNTER — Other Ambulatory Visit: Payer: Self-pay

## 2015-11-11 ENCOUNTER — Other Ambulatory Visit: Payer: Self-pay | Admitting: Nurse Practitioner

## 2015-11-11 MED ORDER — POLYETHYLENE GLYCOL 3350 17 GM/SCOOP PO POWD: ORAL | Status: DC

## 2015-12-16 ENCOUNTER — Telehealth: Payer: Self-pay

## 2015-12-16 ENCOUNTER — Other Ambulatory Visit: Payer: Self-pay | Admitting: Internal Medicine

## 2015-12-16 NOTE — Telephone Encounter (Signed)
Refill request for Lyrica, last seen [id number], last filled [id number].  Please advise.

## 2015-12-16 NOTE — Telephone Encounter (Signed)
Thank you.  Will continue to follow as appropriate.

## 2015-12-16 NOTE — Telephone Encounter (Signed)
Patient is on the list for [employer] 2017 and may be a good candidate for an AWV in 2017.  

## 2015-12-18 ENCOUNTER — Encounter: Payer: Self-pay | Admitting: Internal Medicine

## 2016-02-09 ENCOUNTER — Encounter: Payer: Self-pay | Admitting: Family

## 2016-02-09 ENCOUNTER — Other Ambulatory Visit: Payer: Self-pay | Admitting: Family

## 2016-02-09 ENCOUNTER — Ambulatory Visit (INDEPENDENT_AMBULATORY_CARE_PROVIDER_SITE_OTHER): Payer: Medicare Other | Admitting: Family

## 2016-02-09 VITALS — BP 122/70 | HR 76 | Temp 97.9°F | Wt 145.0 lb

## 2016-02-09 DIAGNOSIS — N898 Other specified noninflammatory disorders of vagina: Secondary | ICD-10-CM

## 2016-02-09 DIAGNOSIS — R350 Frequency of micturition: Secondary | ICD-10-CM

## 2016-02-09 DIAGNOSIS — L298 Other pruritus: Secondary | ICD-10-CM | POA: Diagnosis not present

## 2016-02-09 LAB — POCT URINALYSIS DIPSTICK
BILIRUBIN UA: NEGATIVE
GLUCOSE UA: NEGATIVE
Ketones, UA: NEGATIVE
Leukocytes, UA: NEGATIVE
NITRITE UA: NEGATIVE
PH UA: 6
Protein, UA: NEGATIVE
RBC UA: NEGATIVE
SPEC GRAV UA: 1.015
UROBILINOGEN UA: 0.2

## 2016-02-09 MED ORDER — FLUCONAZOLE 150 MG PO TABS: 150.0000 mg | ORAL_TABLET | Freq: Once | ORAL | 1 refills | Status: AC

## 2016-02-09 NOTE — Progress Notes (Signed)
Pre visit review using our clinic review tool, if applicable. No additional management support is needed unless otherwise documented below in the visit note. 

## 2016-02-09 NOTE — Addendum Note (Signed)
Addended by: Pilar Grammes on: 02/09/2016 11:52 AM   Modules accepted: Orders

## 2016-02-09 NOTE — Patient Instructions (Signed)
Treating you for vaginal yeast infection. Her urine did not show infection however worsening upper culture to be sure. We will call you with these results  If there is no improvement in your symptoms, or if there is any worsening of symptoms, or if you have any additional concerns, please return for re-evaluation; or, if we are closed, consider going to the Emergency Room for evaluation if symptoms urgent.   Monilial Vaginitis Vaginitis in a soreness, swelling and redness (inflammation) of the vagina and vulva. Monilial vaginitis is not a sexually transmitted infection. CAUSES  Yeast vaginitis is caused by yeast (candida) that is normally found in your vagina. With a yeast infection, the candida has overgrown in number to a point that upsets the chemical balance. SYMPTOMS   White, thick vaginal discharge.  Swelling, itching, redness and irritation of the vagina and possibly the lips of the vagina (vulva).  Burning or painful urination.  Painful intercourse. DIAGNOSIS  Things that may contribute to monilial vaginitis are:  Postmenopausal and virginal states.  Pregnancy.  Infections.  Being tired, sick or stressed, especially if you had monilial vaginitis in the past.  Diabetes. Good control will help lower the chance.  Birth control pills.  Tight fitting garments.  Using bubble bath, feminine sprays, douches or deodorant tampons.  Taking certain medications that kill germs (antibiotics).  Sporadic recurrence can occur if you become ill. TREATMENT  Your caregiver will give you medication.  There are several kinds of anti monilial vaginal creams and suppositories specific for monilial vaginitis. For recurrent yeast infections, use a suppository or cream in the vagina 2 times a week, or as directed.  Anti-monilial or steroid cream for the itching or irritation of the vulva may also be used. Get your caregiver's permission.  Painting the vagina with methylene blue solution  may help if the monilial cream does not work.  Eating yogurt may help prevent monilial vaginitis. HOME CARE INSTRUCTIONS   Finish all medication as prescribed.  Do not have sex until treatment is completed or after your caregiver tells you it is okay.  Take warm sitz baths.  Do not douche.  Do not use tampons, especially scented ones.  Wear cotton underwear.  Avoid tight pants and panty hose.  Tell your sexual partner that you have a yeast infection. They should go to their caregiver if they have symptoms such as mild rash or itching.  Your sexual partner should be treated as well if your infection is difficult to eliminate.  Practice safer sex. Use condoms.  Some vaginal medications cause latex condoms to fail. Vaginal medications that harm condoms are:  Cleocin cream.  Butoconazole (Femstat).  Terconazole (Terazol) vaginal suppository.  Miconazole (Monistat) (may be purchased over the counter). SEEK MEDICAL CARE IF:   You have a temperature by mouth above 102 F (38.9 C).  The infection is getting worse after 2 days of treatment.  The infection is not getting better after 3 days of treatment.  You develop blisters in or around your vagina.  You develop vaginal bleeding, and it is not your menstrual period.  You have pain when you urinate.  You develop intestinal problems.  You have pain with sexual intercourse.   This information is not intended to replace advice given to you by your health care provider. Make sure you discuss any questions you have with your health care provider.   Document Released: 04/13/2005 Document Revised: 09/26/2011 Document Reviewed: 01/05/2015 Elsevier Interactive Patient Education [employer].

## 2016-02-09 NOTE — Progress Notes (Signed)
Subjective:    Patient ID: Sandy Merritt, female    DOB: 08-19-1931, 82 y.o.   MRN: [id number]  CC: Sandy Merritt is a 82 y.o. female who presents today for an acute visit.    HPI: Patient here for evaluation of vaginal itching and urinary frequency. She's been using over-the-counter medication. Monistat, with relief. States symptoms have been going on for over a year. Discharge is white. No fever, chills, flank pain.    HISTORY:  Past Medical History:  Diagnosis Date  . Arthritis    rheumatoid  . Chicken pox   . GERD (gastroesophageal reflux disease)   . Glaucoma   . Headaches, cluster   . Lupus ([place])   . Urine incontinence    Past Surgical History:  Procedure Laterality Date  . ABDOMINAL HYSTERECTOMY    . APPENDECTOMY  1970  . CERVICAL SPINE SURGERY     ant. neck/done x2/ screws out now  . CESAREAN SECTION     2  . CHOLECYSTECTOMY  1970  . FOOT SURGERY  years ago   "toes"/"heart stopped during surgery" per pt.only(not medical staff)  . TONSILLECTOMY AND ADENOIDECTOMY  1955  . VAGINAL DELIVERY     1   Family History  Problem Relation Age of Onset  . Arthritis Mother   . Diabetes Mother   . Heart disease Mother   . Arthritis Father   . Heart disease Father   . Heart attack Father   . Diabetes Grandchild   . Drug abuse Maternal Aunt   . Colon cancer Neg Hx     Allergies: Codeine; Propoxyphene; Propoxyphene hcl; and Propoxyphene n-acetaminophen Current Outpatient Prescriptions on File Prior to Visit  Medication Sig Dispense Refill  . aspirin 81 MG tablet Take 81 mg by mouth daily as needed for pain.     Marland Kitchen esomeprazole (NEXIUM) 40 MG capsule TAKE ONE CAPSULE BY MOUTH DAILY BEFORE BREAKFAST (Patient taking differently: Take 40 mg by mouth daily as needed. TAKE ONE CAPSULE BY MOUTH DAILY BEFORE BREAKFAST) 30 capsule 11  . estrogens, conjugated, (PREMARIN) 0.3 MG tablet Take 1 tablet (0.3 mg total) by mouth daily. 30 tablet 3  . hydroxychloroquine  (PLAQUENIL) 200 MG tablet Take 1 tablet (200 mg total) by mouth 2 (two) times daily. 180 tablet 2  . LYRICA 150 MG capsule TAKE ONE CAPSULE BY MOUTH TWICE A DAY 60 capsule 1  . polyethylene glycol powder (GLYCOLAX/MIRALAX) powder TAKE 17 G BY MOUTH DAILY. 255 g 1  . traZODone (DESYREL) 50 MG tablet TAKE 1 TABLET  BY MOUTH AT BEDTIME AS NEEDED FOR SLEEP. 30 tablet 3  . Multiple Vitamins-Minerals (MULTIVITAMIN WITH MINERALS) tablet Take 1 tablet by mouth daily.     No current facility-administered medications on file prior to visit.     Social History  Substance Use Topics  . Smoking status: Never Smoker  . Smokeless tobacco: Never Used  . Alcohol use No    Review of Systems  Constitutional: Negative for chills and fever.  Respiratory: Negative for cough.   Cardiovascular: Negative for chest pain and palpitations.  Gastrointestinal: Negative for nausea and vomiting.  Genitourinary: Positive for dysuria and frequency. Negative for flank pain and vaginal pain.      Objective:    BP 122/70   Pulse 76   Temp 97.9 F (36.6 C) (Oral)   Wt 145 lb (65.8 kg)   SpO2 97%   BMI 29.29 kg/m    Physical Exam  Constitutional: She appears  well-developed and well-nourished.  Eyes: Conjunctivae are normal.  Cardiovascular: Normal rate, regular rhythm, normal heart sounds and normal pulses.   Pulmonary/Chest: Effort normal and breath sounds normal. She has no wheezes. She has no rhonchi. She has no rales.  Abdominal: There is no CVA tenderness.  Genitourinary: There is no rash, tenderness or lesion on the right labia. There is no rash, tenderness or lesion on the left labia. Cervix exhibits no motion tenderness and no discharge. Right adnexum displays no mass, no tenderness and no fullness. Left adnexum displays no mass, no tenderness and no fullness. No erythema, tenderness or bleeding in the vagina. No foreign body in the vagina. No vaginal discharge found.  Genitourinary Comments: Diffuse  vulvovaginal erythema. No lesions. Discharge is thin and clear, not purulent.   Neurological: She is alert.  Skin: Skin is warm and dry.  Psychiatric: She has a normal mood and affect. Her speech is normal and behavior is normal. Thought content normal.  Vitals reviewed.      Assessment & Plan:   1. Urinary frequency UA negative for nitrites, leukocytes, blood. Pending culture. Alternatively considering overactive bladder which I will discuss with patient next visit when she establishes care. - POCT urinalysis dipstick  2. Vaginal itching Vulvovaginal candidiasis. Treating empirically.Pending wet prep. - fluconazole (DIFLUCAN) 150 MG tablet; Take 1 tablet (150 mg total) by mouth once. Take one tablet PO once. If continue to have symptoms, may take one tablet PO 3 days later.  Dispense: 2 tablet; Refill: 1 - wet prep    I am having Ms. Grunden maintain her multivitamin with minerals, aspirin, esomeprazole, estrogens (conjugated), hydroxychloroquine, traZODone, polyethylene glycol powder, and LYRICA.   No orders of the defined types were placed in this encounter.   Return precautions given.   Risks, benefits, and alternatives of the medications and treatment plan prescribed today were discussed, and patient expressed understanding.   Education regarding symptom management and diagnosis given to patient on AVS.  Continue to follow with Rica Mast, MD for routine health maintenance.   Sandy Merritt and I agreed with plan.   Mable Paris, FNP

## 2016-02-10 LAB — WET PREP BY MOLECULAR PROBE
CANDIDA SPECIES: NEGATIVE
GARDNERELLA VAGINALIS: POSITIVE — AB
TRICHOMONAS VAG: NEGATIVE

## 2016-02-13 LAB — URINE CULTURE: Colony Count: >=100000

## 2016-02-15 ENCOUNTER — Telehealth: Payer: Self-pay | Admitting: Family

## 2016-02-15 DIAGNOSIS — B9689 Other specified bacterial agents as the cause of diseases classified elsewhere: Secondary | ICD-10-CM

## 2016-02-15 DIAGNOSIS — N760 Acute vaginitis: Secondary | ICD-10-CM

## 2016-02-15 DIAGNOSIS — N390 Urinary tract infection, site not specified: Secondary | ICD-10-CM

## 2016-02-15 MED ORDER — CEFDINIR 300 MG PO CAPS: 300.0000 mg | ORAL_CAPSULE | Freq: Two times a day (BID) | ORAL | 0 refills | Status: AC

## 2016-02-15 MED ORDER — METRONIDAZOLE 500 MG PO TABS: 500.0000 mg | ORAL_TABLET | Freq: Two times a day (BID) | ORAL | 0 refills | Status: DC

## 2016-02-15 NOTE — Telephone Encounter (Signed)
Patient was informed of results.  Patient understood and no questions, comments, or concerns at this time.  

## 2016-02-15 NOTE — Telephone Encounter (Signed)
Please call pt at: [phone]

## 2016-02-15 NOTE — Telephone Encounter (Signed)
Left message for patient to return phone call back. 

## 2016-02-15 NOTE — Telephone Encounter (Signed)
Please call patient and let her know that urine culture revealed a urinary tract infection and antibiotic has been sent. Wet prep also showed bacterial vaginosis and antibiotic has been sent for that as well. Please ask her to follow up if symptoms persist after treatment.

## 2016-02-23 ENCOUNTER — Telehealth: Payer: Self-pay | Admitting: Internal Medicine

## 2016-02-23 DIAGNOSIS — N3001 Acute cystitis with hematuria: Secondary | ICD-10-CM

## 2016-02-23 NOTE — Telephone Encounter (Signed)
Pt lvm needing to speak to [facility]. She did not leave any more detail as to what she needed.

## 2016-02-25 NOTE — Telephone Encounter (Signed)
Patient received an older message. Have already spoken to patient.

## 2016-03-01 ENCOUNTER — Ambulatory Visit (INDEPENDENT_AMBULATORY_CARE_PROVIDER_SITE_OTHER): Payer: Medicare Other | Admitting: Family

## 2016-03-01 ENCOUNTER — Encounter: Payer: Self-pay | Admitting: Family

## 2016-03-01 VITALS — BP 132/70 | HR 70 | Temp 98.0°F | Ht 59.0 in | Wt 147.8 lb

## 2016-03-01 DIAGNOSIS — L298 Other pruritus: Secondary | ICD-10-CM

## 2016-03-01 DIAGNOSIS — N898 Other specified noninflammatory disorders of vagina: Secondary | ICD-10-CM | POA: Diagnosis not present

## 2016-03-01 DIAGNOSIS — Z0001 Encounter for general adult medical examination with abnormal findings: Secondary | ICD-10-CM

## 2016-03-01 DIAGNOSIS — R6889 Other general symptoms and signs: Secondary | ICD-10-CM

## 2016-03-01 DIAGNOSIS — R350 Frequency of micturition: Secondary | ICD-10-CM | POA: Diagnosis not present

## 2016-03-01 LAB — POCT URINALYSIS DIPSTICK
BILIRUBIN UA: NEGATIVE
GLUCOSE UA: NEGATIVE
KETONES UA: NEGATIVE
NITRITE UA: NEGATIVE
PH UA: 7
Protein, UA: NEGATIVE
Spec Grav, UA: 1.015
Urobilinogen, UA: 0.2

## 2016-03-01 LAB — URINALYSIS, ROUTINE W REFLEX MICROSCOPIC
BILIRUBIN URINE: NEGATIVE
Hgb urine dipstick: NEGATIVE
KETONES UR: NEGATIVE
NITRITE: NEGATIVE
Specific Gravity, Urine: 1.01 (ref 1.000–1.030)
Total Protein, Urine: NEGATIVE
URINE GLUCOSE: NEGATIVE
UROBILINOGEN UA: 0.2 (ref 0.0–1.0)
pH: 7 (ref 5.0–8.0)

## 2016-03-01 MED ORDER — CIPROFLOXACIN HCL 250 MG PO TABS: 250.0000 mg | ORAL_TABLET | Freq: Two times a day (BID) | ORAL | 0 refills | Status: DC

## 2016-03-01 NOTE — Progress Notes (Signed)
Subjective:    Patient ID: Sandy Merritt, female    DOB: 1931-12-21, 82 y.o.   MRN: [id number]  CC: Sandy Merritt is a 82 y.o. female who presents today for follow up.   HPI: Here for follow up for external vaginal itching, which has remained for 2 months. Patient evaluated 2 weeks ago, wet prep was positive for bacterial vaginosis and urine culture for UTI. Slightly better after omnicef and flaygl. No vaginal bleeding, or blood in urine. Patient states skin cancer under right axilla ( not in chart), no GYN cancer. No abdominal pain or distention. Occasional urge incontinence, urinary urgency. Endorses nocturia.  No history of diabetes. H/o hysterectomy.       7/31 patient was treated for bacterial vaginosis and UTI with omnicef and Flagyl.  Last mammogram 2016 which was normal. Patient is due for a mammogram December 2017. Patient will call to schedule. Ordered cologuard for patient.   HISTORY:  Past Medical History:  Diagnosis Date  . Arthritis    rheumatoid  . Chicken pox   . GERD (gastroesophageal reflux disease)   . Glaucoma   . Headaches, cluster   . Lupus ([place])   . Urine incontinence    Past Surgical History:  Procedure Laterality Date  . ABDOMINAL HYSTERECTOMY    . APPENDECTOMY  1970  . CERVICAL SPINE SURGERY     ant. neck/done x2/ screws out now  . CESAREAN SECTION     2  . CHOLECYSTECTOMY  1970  . FOOT SURGERY  years ago   "toes"/"heart stopped during surgery" per pt.only(not medical staff)  . TONSILLECTOMY AND ADENOIDECTOMY  1955  . VAGINAL DELIVERY     1   Family History  Problem Relation Age of Onset  . Arthritis Mother   . Diabetes Mother   . Heart disease Mother   . Arthritis Father   . Heart disease Father   . Heart attack Father   . Diabetes Grandchild   . Drug abuse Maternal Aunt   . Colon cancer Neg Hx     Allergies: Codeine; Propoxyphene; Propoxyphene hcl; and Propoxyphene n-acetaminophen Current Outpatient Prescriptions on  File Prior to Visit  Medication Sig Dispense Refill  . aspirin 81 MG tablet Take 81 mg by mouth daily as needed for pain.     Marland Kitchen esomeprazole (NEXIUM) 40 MG capsule TAKE ONE CAPSULE BY MOUTH DAILY BEFORE BREAKFAST (Patient taking differently: Take 40 mg by mouth daily as needed. TAKE ONE CAPSULE BY MOUTH DAILY BEFORE BREAKFAST) 30 capsule 11  . estrogens, conjugated, (PREMARIN) 0.3 MG tablet Take 1 tablet (0.3 mg total) by mouth daily. 30 tablet 3  . hydroxychloroquine (PLAQUENIL) 200 MG tablet Take 1 tablet (200 mg total) by mouth 2 (two) times daily. 180 tablet 2  . LYRICA 150 MG capsule TAKE ONE CAPSULE BY MOUTH TWICE A DAY 60 capsule 1  . Multiple Vitamins-Minerals (MULTIVITAMIN WITH MINERALS) tablet Take 1 tablet by mouth daily.    . traZODone (DESYREL) 50 MG tablet TAKE 1 TABLET  BY MOUTH AT BEDTIME AS NEEDED FOR SLEEP. 30 tablet 3   No current facility-administered medications on file prior to visit.     Social History  Substance Use Topics  . Smoking status: Never Smoker  . Smokeless tobacco: Never Used  . Alcohol use No    Review of Systems  Constitutional: Negative for chills and fever.  Respiratory: Negative for cough.   Cardiovascular: Negative for chest pain and palpitations.  Gastrointestinal: Negative for  nausea and vomiting.      Objective:    BP 132/70   Pulse 70   Temp 98 F (36.7 C) (Oral)   Ht 4\' 11"  (1.499 m)   Wt 147 lb 12.8 oz (67 kg)   SpO2 97%   BMI 29.85 kg/m  BP Readings from Last 3 Encounters:  03/01/16 132/70  02/09/16 122/70  09/29/15 (!) 151/84   Wt Readings from Last 3 Encounters:  03/01/16 147 lb 12.8 oz (67 kg)  02/09/16 145 lb (65.8 kg)  09/29/15 132 lb 6 oz (60 kg)    Physical Exam  Constitutional: She appears well-developed and well-nourished.  Eyes: Conjunctivae are normal.  Cardiovascular: Normal rate, regular rhythm, normal heart sounds and normal pulses.   Pulmonary/Chest: Effort normal and breath sounds normal. She has no  wheezes. She has no rhonchi. She has no rales.  Neurological: She is alert.  Skin: Skin is warm and dry.  Psychiatric: She has a normal mood and affect. Her speech is normal and behavior is normal. Thought content normal.  Vitals reviewed.      Assessment & Plan:   Problem List Items Addressed This Visit      Other   Urinary frequency    Urinalysis positive for leukocytes. This patient was recently treated for UTI, await urine culture.      Vaginal irritation    Working diagnosis of vaginal atrophy.Politely declined pelvic exam again today as this hasn't been done at prior visit. Patient has no GYN cancer, vaginal bleeding, vaginal pain. Discussed trial of a low-dose topical vaginal cream such as Estrace however patient politely declined and she would prefer to use over-the-counter lubricant to see if this relieves her symptoms. We'll follow with close follow-up and consider consult with GYN or urology if symptoms persist.       Other Visit Diagnoses    Encounter for routine adult physical exam with abnormal findings    -  Primary   Relevant Orders   MM DIGITAL SCREENING BILATERAL   Vaginal itching       Relevant Orders   POCT urinalysis dipstick (Completed)   Urinalysis, Routine w reflex microscopic (not at [facility])       I have discontinued Sandy Merritt's polyethylene glycol powder and metroNIDAZOLE. I am also having her maintain her multivitamin with minerals, aspirin, esomeprazole, estrogens (conjugated), hydroxychloroquine, traZODone, and LYRICA.   No orders of the defined types were placed in this encounter.   Return precautions given.   Risks, benefits, and alternatives of the medications and treatment plan prescribed today were discussed, and patient expressed understanding.   Education regarding symptom management and diagnosis given to patient on AVS.  Continue to follow with Rica Mast, MD for routine health maintenance.   Sandy Merritt and I  agreed with plan.   Mable Paris, FNP

## 2016-03-01 NOTE — Patient Instructions (Addendum)
May try over the counter vaginal lubricant to help with vaginal dryness. Suspect vaginal atrophy.  Try to drink during day so that you urinate less at night.  If there is no improvement in your symptoms, or if there is any worsening of symptoms, or if you have any additional concerns, please return for re-evaluation; or, if we are closed, consider going to the Emergency Room for evaluation if symptoms urgent.

## 2016-03-01 NOTE — Assessment & Plan Note (Signed)
Urinalysis positive for leukocytes. This patient was recently treated for UTI, await urine culture.

## 2016-03-01 NOTE — Assessment & Plan Note (Addendum)
Working diagnosis of vaginal atrophy.Politely declined pelvic exam again today as this hasn't been done at prior visit. Patient has no GYN cancer, vaginal bleeding, vaginal pain. Discussed trial of a low-dose topical vaginal cream such as Estrace however patient politely declined and she would prefer to use over-the-counter lubricant to see if this relieves her symptoms. We'll follow with close follow-up and consider consult with GYN or urology if symptoms persist.

## 2016-03-01 NOTE — Progress Notes (Signed)
Pre visit review using our clinic review tool, if applicable. No additional management support is needed unless otherwise documented below in the visit note. 

## 2016-03-02 ENCOUNTER — Telehealth: Payer: Self-pay | Admitting: Family

## 2016-03-02 NOTE — Telephone Encounter (Signed)
Ms. Sandy Merritt called saying she has something new to discuss with Fransisco Beau and wouldn't go into detail. She'd like a return phone call.  Pt's ph# [phone] Thank you.

## 2016-03-02 NOTE — Telephone Encounter (Signed)
Patient was informed of results.  Patient understood and no questions, comments, or concerns at this time.  

## 2016-03-04 ENCOUNTER — Other Ambulatory Visit: Payer: Self-pay

## 2016-03-04 ENCOUNTER — Telehealth: Payer: Self-pay

## 2016-03-04 NOTE — Telephone Encounter (Signed)
Refill request for Trazodone, last fill was in February 2017 with 3 refills.-aa

## 2016-03-04 NOTE — Telephone Encounter (Signed)
Fax from [facility] to get Lyrica refilled. Last filled by Dr Gilford Rile on 12/16/15 with 1 refill. Please review-aa

## 2016-03-06 ENCOUNTER — Telehealth: Payer: Self-pay | Admitting: Family

## 2016-03-06 DIAGNOSIS — G609 Hereditary and idiopathic neuropathy, unspecified: Secondary | ICD-10-CM

## 2016-03-06 DIAGNOSIS — N898 Other specified noninflammatory disorders of vagina: Secondary | ICD-10-CM

## 2016-03-06 MED ORDER — TRAZODONE HCL 50 MG PO TABS: ORAL_TABLET | ORAL | 3 refills | Status: DC

## 2016-03-06 MED ORDER — PREGABALIN 150 MG PO CAPS: 150.0000 mg | ORAL_CAPSULE | Freq: Two times a day (BID) | ORAL | 1 refills | Status: DC

## 2016-03-06 NOTE — Telephone Encounter (Signed)
Please also let patient know that I have printed her refill for lyrica however prior to faxing will you call patient-   Please discuss my concern with taking trazodone with lyrica as can cause excess sleepiness.   Does she very sleepy on this regimen? If yes, please let me know ,and we can schedule a OV to discuss alternate options.

## 2016-03-06 NOTE — Telephone Encounter (Signed)
Would you call patient and see how her vaginal itching symptoms are?? If not resolved, we can place a consult to GYN as already on premarin.

## 2016-03-07 NOTE — Telephone Encounter (Signed)
Called patient was unable to leave message.

## 2016-03-07 NOTE — Telephone Encounter (Signed)
Would you call patient and let her know that a referral to GYN has been placed.

## 2016-03-07 NOTE — Telephone Encounter (Signed)
Please advise 

## 2016-03-07 NOTE — Telephone Encounter (Signed)
Spoke to patient, she stated she understood what she was told.  Also she would like a referral placed vaginal SX are not alleviating.

## 2016-03-08 ENCOUNTER — Ambulatory Visit (INDEPENDENT_AMBULATORY_CARE_PROVIDER_SITE_OTHER): Payer: Medicare Other | Admitting: Family

## 2016-03-08 ENCOUNTER — Encounter: Payer: Self-pay | Admitting: Family

## 2016-03-08 ENCOUNTER — Other Ambulatory Visit: Payer: Self-pay

## 2016-03-08 VITALS — BP 128/78 | HR 68 | Temp 97.6°F | Resp 16 | Wt 146.0 lb

## 2016-03-08 DIAGNOSIS — R350 Frequency of micturition: Secondary | ICD-10-CM

## 2016-03-08 DIAGNOSIS — Z23 Encounter for immunization: Secondary | ICD-10-CM

## 2016-03-08 LAB — COMPREHENSIVE METABOLIC PANEL
ALK PHOS: 49 U/L (ref 39–117)
ALT: 14 U/L (ref 0–35)
AST: 19 U/L (ref 0–37)
Albumin: 4 g/dL (ref 3.5–5.2)
BILIRUBIN TOTAL: 0.3 mg/dL (ref 0.2–1.2)
BUN: 16 mg/dL (ref 6–23)
CALCIUM: 8.8 mg/dL (ref 8.4–10.5)
CO2: 28 meq/L (ref 19–32)
Chloride: 103 mEq/L (ref 96–112)
Creatinine, Ser: 0.86 mg/dL (ref 0.40–1.20)
GFR: 67.18 mL/min (ref >60.00)
Glucose, Bld: 97 mg/dL (ref 70–99)
Potassium: 4.1 mEq/L (ref 3.5–5.1)
Sodium: 138 mEq/L (ref 135–145)
TOTAL PROTEIN: 7.5 g/dL (ref 6.0–8.3)

## 2016-03-08 LAB — POCT URINALYSIS DIPSTICK
Bilirubin, UA: NEGATIVE
Blood, UA: NEGATIVE
Glucose, UA: NEGATIVE
KETONES UA: NEGATIVE
NITRITE UA: NEGATIVE
PROTEIN UA: 30
UROBILINOGEN UA: 0.2
pH, UA: 6

## 2016-03-08 LAB — HEMOGLOBIN A1C: Hgb A1c MFr Bld: 5.8 % (ref 4.6–6.5)

## 2016-03-08 MED ORDER — ESTROGENS CONJUGATED 0.3 MG PO TABS: 0.3000 mg | ORAL_TABLET | Freq: Every day | ORAL | 3 refills | Status: DC

## 2016-03-08 MED ORDER — OXYBUTYNIN CHLORIDE 5 MG PO TABS: 2.5000 mg | ORAL_TABLET | Freq: Two times a day (BID) | ORAL | 0 refills | Status: DC

## 2016-03-08 NOTE — Patient Instructions (Signed)
Suspect overactive bladder causing 'diaper rash'. Try Desitin cream.  If there is no improvement in your symptoms, or if there is any worsening of symptoms, or if you have any additional concerns, please return for re-evaluation; or, if we are closed, consider going to the Emergency Room for evaluation if symptoms urgent.   Neurogenic Bladder Neurogenic bladder is a bladder control disorder. It is caused by problems with the nerves that control your bladder. This condition may make your bladder overactive or underactive. You may have trouble holding your urine or passing your urine (urinating). The bladder is a hollow organ in the lower part of your abdomen. It stores urine after the urine is made by your kidneys. Normally, when your bladder is not full, the muscles that control your bladder are relaxed. When your bladder fills with urine, nerve signals are sent to your brain, indicating that your bladder is full. Your brain then sends signals through your spinal cord to the muscles in your bladder that start and stop urine flow. If you have neurogenic bladder, the nerves and muscles do not work together the way they should. CAUSES  Any kind of nerve damage or condition that disrupts the signals from your brain to your bladder can cause neurogenic bladder. Many things can cause these nerve problems. They include:  A disease that affects the nervous system, such as:  Alzheimer disease.  Cerebral palsy.  Multiple sclerosis.  Diabetes.  Parkinson disease.  Damage to your brain or spinal cord from:  Trauma.  Tumor.  Infection.  Surgery.  Alcohol abuse.  Stroke.  A spinal cord birth defect. RISK FACTORS Having nerve damage or a nerve disorder puts you at risk for neurogenic bladder. SIGNS AND SYMPTOMS  Signs and symptoms of neurogenic bladder include:  Leaking or gushing urine (incontinence).  A sudden, strong urge to pass urine (urgency).  Frequent urination during the day  and night.  Being unable to empty your bladder completely (urinary retention).  Frequent urinary tract infections. DIAGNOSIS  Your health care provider may diagnose neurogenic bladder based on your symptoms and medical history. A physical exam will also be done. You may be asked to keep a record of your bladder symptoms and the times that you urinate (bladder diary). Your health care provider may also do several tests to help diagnose neurogenic bladder, including:  A urine test to check for infection.  A bladder scan after you urinate to see how much urine is left in your bladder.  Various tests to measure your urine flow and see how well the flow is controlled (urodynamic tests).  A procedure that involves using a tool that is like a very thin telescope to look through your urethra into your bladder (cystoscopy). A health care provider who specializes in the urinary tract (urologist) may do this test.  Imaging tests of your brain or spine. TREATMENT  Treatment depends on the cause of your neurogenic bladder and the symptoms you are having. Work closely with your health care provider to find the treatments that will improve your quality of life. Treatment options include:  Learning ways to control when you urinate, such as:  Urinating at scheduled times.  Training yourself to delay urination.  Doing exercises (Kegel exercises) to strengthen the muscles that control urine flow.  Avoiding foods or drinks that make your symptoms worse.  Taking medicines to:  Stimulate an underactive bladder.  Relax an overactive bladder.  Treat a urinary tract infection.  Learning how to use a  thin tube (catheter) to empty your bladder. A catheter is a hollow tube that you pass through your urethra.  Procedures to stimulate the nerves that control your bladder.  Surgical procedures if other treatments do not help. HOME CARE INSTRUCTIONS   Keep a bladder diary to find out which foods,  liquids, or activities make your symptoms worse.  Use your bladder diary to schedule bathroom trips. If you are away from home, plan to be near a bathroom when your schedule says you need one.  Do Kegel exercises to strengthen the muscles that control urination. These muscles are the ones you use to try to hold urine when you need to go. To do Kegel exercises:  Squeeze these muscles tight and hold for about 10 seconds.  Repeat three times.  Do these exercises often during the day when you do not have to urinate.  Limit your drinking of beverages that stimulate urination. These include soda, coffee, and tea.  After urinating, wait a few minutes and try again (double voiding).  Make sure you urinate just before you leave the house and just before you go to bed.  Take medicines only as directed by your health care provider.  Keep all follow-up visits as directed by your health care provider. This is important. SEEK MEDICAL CARE IF:   You are having a hard time controlling your symptoms.  Your symptoms are getting worse.  You have signs of a urinary tract infection:  A burning feeling when you urinate.  Chills.  Fever. SEEK IMMEDIATE MEDICAL CARE IF:  You cannot pass urine.    This information is not intended to replace advice given to you by your health care provider. Make sure you discuss any questions you have with your health care provider.   Document Released: 01/15/2007 Document Revised: 07/25/2014 Document Reviewed: 10/15/2013 Elsevier Interactive Patient Education [employer].

## 2016-03-08 NOTE — Telephone Encounter (Signed)
Patient has been notified today during appointment.

## 2016-03-08 NOTE — Progress Notes (Signed)
Subjective:    Patient ID: Sandy Merritt, female    DOB: 09-11-1931, 82 y.o.   MRN: [id number]  CC: Sandy Merritt is a 82 y.o. female who presents today for follow up.   HPI: Patient here for follow-up if she is still having vaginal complaints of itching and irritation. Tried vaginal creams for itching OTC with short term relief. Urinary urgency 'around the clock'. Wearing urinary pads. Sneeze causes her to leak urine. No vaginal odor or  increase in discharge. Endorses dysuria. No fever, chills, low back ache. Takes premarin however havent noticed help with itching.   Recently treated for UTI with cipro course 2 weeks ago.  She also completed flagyl and diflucan one month ago for BV and suspected ( negative wet prep) candidaisis. 7/25 pelvic exam done.      HISTORY:  Past Medical History:  Diagnosis Date  . Arthritis    rheumatoid  . Chicken pox   . GERD (gastroesophageal reflux disease)   . Glaucoma   . Headaches, cluster   . Lupus ([place])   . Urine incontinence    Past Surgical History:  Procedure Laterality Date  . ABDOMINAL HYSTERECTOMY    . APPENDECTOMY  1970  . CERVICAL SPINE SURGERY     ant. neck/done x2/ screws out now  . CESAREAN SECTION     2  . CHOLECYSTECTOMY  1970  . FOOT SURGERY  years ago   "toes"/"heart stopped during surgery" per pt.only(not medical staff)  . TONSILLECTOMY AND ADENOIDECTOMY  1955  . VAGINAL DELIVERY     1   Family History  Problem Relation Age of Onset  . Arthritis Mother   . Diabetes Mother   . Heart disease Mother   . Arthritis Father   . Heart disease Father   . Heart attack Father   . Diabetes Grandchild   . Drug abuse Maternal Aunt   . Colon cancer Neg Hx     Allergies: Codeine; Propoxyphene; Propoxyphene hcl; and Propoxyphene n-acetaminophen Current Outpatient Prescriptions on File Prior to Visit  Medication Sig Dispense Refill  . aspirin 81 MG tablet Take 81 mg by mouth daily as needed for pain.     Marland Kitchen  esomeprazole (NEXIUM) 40 MG capsule TAKE ONE CAPSULE BY MOUTH DAILY BEFORE BREAKFAST (Patient taking differently: Take 40 mg by mouth daily as needed. TAKE ONE CAPSULE BY MOUTH DAILY BEFORE BREAKFAST) 30 capsule 11  . hydroxychloroquine (PLAQUENIL) 200 MG tablet Take 1 tablet (200 mg total) by mouth 2 (two) times daily. 180 tablet 2  . Multiple Vitamins-Minerals (MULTIVITAMIN WITH MINERALS) tablet Take 1 tablet by mouth daily.    . pregabalin (LYRICA) 150 MG capsule Take 1 capsule (150 mg total) by mouth 2 (two) times daily. 60 capsule 1  . traZODone (DESYREL) 50 MG tablet TAKE 1 TABLET  BY MOUTH AT BEDTIME AS NEEDED FOR SLEEP. 30 tablet 3   No current facility-administered medications on file prior to visit.     Social History  Substance Use Topics  . Smoking status: Never Smoker  . Smokeless tobacco: Never Used  . Alcohol use No    Review of Systems  Constitutional: Negative for chills and fever.  Respiratory: Negative for cough.   Cardiovascular: Negative for chest pain and palpitations.  Gastrointestinal: Negative for abdominal pain, nausea and vomiting.  Genitourinary: Positive for dysuria and urgency. Negative for flank pain, frequency and vaginal bleeding.      Objective:    BP 128/78 (BP Location: Left  Arm, Patient Position: Sitting, Cuff Size: Large)   Pulse 68   Temp 97.6 F (36.4 C) (Oral)   Resp 16   Wt 146 lb (66.2 kg)   SpO2 96%   BMI 29.49 kg/m  BP Readings from Last 3 Encounters:  03/08/16 128/78  03/01/16 132/70  02/09/16 122/70   Wt Readings from Last 3 Encounters:  03/08/16 146 lb (66.2 kg)  03/01/16 147 lb 12.8 oz (67 kg)  02/09/16 145 lb (65.8 kg)    Physical Exam  Constitutional: She appears well-developed and well-nourished.  Cardiovascular: Normal rate, regular rhythm, normal heart sounds and normal pulses.   Pulmonary/Chest: Effort normal and breath sounds normal. She has no wheezes. She has no rhonchi. She has no rales.  Abdominal: There is  no CVA tenderness.  Neurological: She is alert.  Skin: Skin is warm and dry.  Psychiatric: She has a normal mood and affect. Her speech is normal and behavior is normal. Thought content normal.  Vitals reviewed.      Assessment & Plan:   Problem List Items Addressed This Visit      Other   Urinary frequency - Primary    Symptoms c/w mixed incontinence/OAB  Which I suspect is causing contact dermatitis. Alternatively, consider another UTI. Trace leukocytes. Will await culture prior to treatment. Regarding a referral for GYN if all else is unrevealing for further evaluation. We  agreed on trial of oxybutynin for overactive bladder and follow-up in one month. Medication side effects discussed.       Relevant Medications   oxybutynin (DITROPAN) 5 MG tablet   Other Relevant Orders   POCT urinalysis dipstick (Completed)   CULTURE, URINE COMPREHENSIVE   Hemoglobin A1c   Comprehensive metabolic panel    Other Visit Diagnoses    Need for influenza vaccination       Relevant Orders   Flu Vaccine QUAD 36+ mos IM   Encounter for immunization       Relevant Orders   Flu Vaccine QUAD 36+ mos IM (Completed)       I have discontinued Ms. Zietz's ciprofloxacin. I am also having her start on oxybutynin. Additionally, I am having her maintain her multivitamin with minerals, aspirin, esomeprazole, hydroxychloroquine, traZODone, and pregabalin.   Meds ordered this encounter  Medications  . oxybutynin (DITROPAN) 5 MG tablet    Sig: Take 0.5 tablets (2.5 mg total) by mouth 2 (two) times daily.    Dispense:  30 tablet    Refill:  0    Order Specific Question:   Supervising Provider    Answer:   Crecencio Mc [[id number]]    Return precautions given.   Risks, benefits, and alternatives of the medications and treatment plan prescribed today were discussed, and patient expressed understanding.   Education regarding symptom management and diagnosis given to patient on AVS.  Continue to follow  with Rica Mast, MD for routine health maintenance.   Sandy Merritt and I agreed with plan.   Mable Paris, FNP

## 2016-03-08 NOTE — Assessment & Plan Note (Addendum)
Symptoms c/w mixed incontinence/OAB  Which I suspect is causing contact dermatitis. Alternatively, consider another UTI. Trace leukocytes. Will await culture prior to treatment. Regarding a referral for GYN if all else is unrevealing for further evaluation. We  agreed on trial of oxybutynin for overactive bladder and follow-up in one month. Medication side effects discussed.

## 2016-03-08 NOTE — Telephone Encounter (Signed)
Pharmacy requesting refill.

## 2016-03-08 NOTE — Telephone Encounter (Signed)
Patient was called but was unable to leave message.

## 2016-03-10 ENCOUNTER — Telehealth: Payer: Self-pay | Admitting: *Deleted

## 2016-03-10 LAB — CULTURE, URINE COMPREHENSIVE: Organism ID, Bacteria: NO GROWTH

## 2016-03-10 NOTE — Telephone Encounter (Signed)
Patient was informed that urine CX has not finished yet.

## 2016-03-10 NOTE — Telephone Encounter (Signed)
Patient requested lab results  Pt contact [phone]

## 2016-03-15 ENCOUNTER — Telehealth: Payer: Self-pay | Admitting: Internal Medicine

## 2016-03-15 NOTE — Telephone Encounter (Signed)
Pt called about needing some instructions on what to do with the Cologuard kit?   Call pt @ [phone]. Thank you!

## 2016-03-15 NOTE — Telephone Encounter (Signed)
Second attempt was made to call patient and let her know to bring in her cologuard kit to her office visit with Arnett. LVM for her to callback.

## 2016-03-15 NOTE — Telephone Encounter (Signed)
Attempted to reach patient, unable to leave a message, kept ringing. Will try again. thanks

## 2016-03-16 ENCOUNTER — Ambulatory Visit (INDEPENDENT_AMBULATORY_CARE_PROVIDER_SITE_OTHER): Payer: Medicare Other

## 2016-03-16 ENCOUNTER — Encounter: Payer: Self-pay | Admitting: Family

## 2016-03-16 ENCOUNTER — Ambulatory Visit (INDEPENDENT_AMBULATORY_CARE_PROVIDER_SITE_OTHER): Payer: Medicare Other | Admitting: Family

## 2016-03-16 VITALS — BP 128/78 | HR 71 | Temp 97.6°F | Wt 147.8 lb

## 2016-03-16 DIAGNOSIS — M47812 Spondylosis without myelopathy or radiculopathy, cervical region: Secondary | ICD-10-CM

## 2016-03-16 DIAGNOSIS — R351 Nocturia: Secondary | ICD-10-CM | POA: Diagnosis not present

## 2016-03-16 DIAGNOSIS — H539 Unspecified visual disturbance: Secondary | ICD-10-CM | POA: Diagnosis not present

## 2016-03-16 DIAGNOSIS — M542 Cervicalgia: Secondary | ICD-10-CM

## 2016-03-16 DIAGNOSIS — Z0000 Encounter for general adult medical examination without abnormal findings: Secondary | ICD-10-CM | POA: Diagnosis not present

## 2016-03-16 DIAGNOSIS — R350 Frequency of micturition: Secondary | ICD-10-CM

## 2016-03-16 NOTE — Progress Notes (Addendum)
Subjective:    Patient ID: Sandy Merritt, female    DOB: [birth year], 82 y.o.   MRN: [id number]  CC: Sandy Merritt is a 82 y.o. female who presents today for follow up.   HPI: Patient for follow-up on overactive bladder symptoms and recent starting of antimuscarinic agent, oxybutynin. She also wants to discuss vision changes and neck pain.  Urinating less during the day due to medication however going every hour at night. Irritation better. Continues to wear pad. No low back pain, fever, chills. No constipation on medication.  Cervical spine pain: Left sided neck pain and radiates to top of head for several months. Neck feels tight. Notes intermittent changes in vision where she sees total 'blackness' which last for a few seconds. It resolves on its own. No peripheral or central vision loss. No HA. Hasn't tried any medications for neck pain.   Due for exam and dentist.       HISTORY:  Past Medical History:  Diagnosis Date  . Arthritis    rheumatoid  . Chicken pox   . GERD (gastroesophageal reflux disease)   . Glaucoma   . Headaches, cluster   . Lupus ([place])   . Urine incontinence    Past Surgical History:  Procedure Laterality Date  . ABDOMINAL HYSTERECTOMY    . APPENDECTOMY  1970  . CERVICAL SPINE SURGERY     ant. neck/done x2/ screws out now  . CESAREAN SECTION     2  . CHOLECYSTECTOMY  1970  . FOOT SURGERY  years ago   "toes"/"heart stopped during surgery" per pt.only(not medical staff)  . TONSILLECTOMY AND ADENOIDECTOMY  1955  . VAGINAL DELIVERY     1   Family History  Problem Relation Age of Onset  . Arthritis Mother   . Diabetes Mother   . Heart disease Mother   . Arthritis Father   . Heart disease Father   . Heart attack Father   . Diabetes Grandchild   . Drug abuse Maternal Aunt   . Colon cancer Neg Hx     Allergies: Codeine; Propoxyphene; Propoxyphene hcl; and Propoxyphene n-acetaminophen Current Outpatient Prescriptions on File Prior to  Visit  Medication Sig Dispense Refill  . aspirin 81 MG tablet Take 81 mg by mouth daily as needed for pain.     Marland Kitchen esomeprazole (NEXIUM) 40 MG capsule TAKE ONE CAPSULE BY MOUTH DAILY BEFORE BREAKFAST (Patient taking differently: Take 40 mg by mouth daily as needed. TAKE ONE CAPSULE BY MOUTH DAILY BEFORE BREAKFAST) 30 capsule 11  . estrogens, conjugated, (PREMARIN) 0.3 MG tablet Take 1 tablet (0.3 mg total) by mouth daily. 30 tablet 3  . hydroxychloroquine (PLAQUENIL) 200 MG tablet Take 1 tablet (200 mg total) by mouth 2 (two) times daily. 180 tablet 2  . Multiple Vitamins-Minerals (MULTIVITAMIN WITH MINERALS) tablet Take 1 tablet by mouth daily.    Marland Kitchen oxybutynin (DITROPAN) 5 MG tablet Take 0.5 tablets (2.5 mg total) by mouth 2 (two) times daily. 30 tablet 0  . pregabalin (LYRICA) 150 MG capsule Take 1 capsule (150 mg total) by mouth 2 (two) times daily. 60 capsule 1  . traZODone (DESYREL) 50 MG tablet TAKE 1 TABLET  BY MOUTH AT BEDTIME AS NEEDED FOR SLEEP. 30 tablet 3   No current facility-administered medications on file prior to visit.     Social History  Substance Use Topics  . Smoking status: Never Smoker  . Smokeless tobacco: Never Used  . Alcohol use No  Review of Systems  Constitutional: Negative for chills and fever.  Eyes: Positive for visual disturbance.  Respiratory: Negative for cough.   Cardiovascular: Negative for chest pain and palpitations.  Gastrointestinal: Negative for nausea and vomiting.  Genitourinary: Positive for frequency. Negative for dysuria, flank pain, urgency, vaginal bleeding and vaginal discharge.  Musculoskeletal: Positive for neck pain and neck stiffness.  Neurological: Negative for dizziness, seizures, syncope, weakness and headaches.      Objective:    BP 128/78   Pulse 71   Temp 97.6 F (36.4 C) (Oral)   Wt 147 lb 12.8 oz (67 kg)   SpO2 98%   BMI 29.85 kg/m  BP Readings from Last 3 Encounters:  03/16/16 128/78  03/08/16 128/78    03/01/16 132/70   Wt Readings from Last 3 Encounters:  03/16/16 147 lb 12.8 oz (67 kg)  03/08/16 146 lb (66.2 kg)  03/01/16 147 lb 12.8 oz (67 kg)    Physical Exam  Constitutional: She appears well-developed and well-nourished.  HENT:  Head: Normocephalic and atraumatic.  Right Ear: Hearing, tympanic membrane, external ear and ear canal normal. No swelling or tenderness. Tympanic membrane is not erythematous and not bulging. No middle ear effusion.  Left Ear: Tympanic membrane, external ear and ear canal normal. No swelling or tenderness. Tympanic membrane is not erythematous and not bulging.  No middle ear effusion.  Nose: Nose normal. No rhinorrhea. Right sinus exhibits no maxillary sinus tenderness and no frontal sinus tenderness. Left sinus exhibits no maxillary sinus tenderness and no frontal sinus tenderness.  Mouth/Throat: Uvula is midline, oropharynx is clear and moist and mucous membranes are normal. No posterior oropharyngeal edema or posterior oropharyngeal erythema.  Eyes: Conjunctivae, EOM and lids are normal. Pupils are equal, round, and reactive to light. Lids are everted and swept, no foreign bodies found.  Normal fundus bilaterally  Cardiovascular: Normal rate, regular rhythm, normal heart sounds and normal pulses.   No carotid bruits.  Pulmonary/Chest: Effort normal and breath sounds normal. She has no wheezes. She has no rhonchi. She has no rales.  Musculoskeletal:       Cervical back: She exhibits decreased range of motion, tenderness, bony tenderness, pain and spasm. She exhibits no swelling and no edema.  Tenderness over cervical spine. Decreased movement when asked to look laterally due to pain.   Lymphadenopathy:       Head (right side): No submental, no submandibular, no tonsillar, no preauricular, no posterior auricular and no occipital adenopathy present.       Head (left side): No submental, no submandibular, no tonsillar, no preauricular, no posterior  auricular and no occipital adenopathy present.    She has no cervical adenopathy.       Right cervical: No superficial cervical, no deep cervical and no posterior cervical adenopathy present.      Left cervical: No superficial cervical, no deep cervical and no posterior cervical adenopathy present.  Neurological: She is alert. She has normal strength. No cranial nerve deficit or sensory deficit. She displays a negative Romberg sign.  Reflex Scores:      Bicep reflexes are 2+ on the right side and 2+ on the left side.      Patellar reflexes are 2+ on the right side and 2+ on the left side. Grip equal and strong bilateral upper extremities. Gait strong and steady. Able to perform  finger-to-nose without difficulty.   Skin: Skin is warm and dry.  Psychiatric: She has a normal mood and affect. Her speech  is normal and behavior is normal. Thought content normal.  Vitals reviewed.      Assessment & Plan:   Problem List Items Addressed This Visit      Musculoskeletal and Integument   Degenerative arthritis of cervical spine    Ordering cervical spine x-ray today to ensure no compression fracture due to bony tenderness appreciated on exam. Patient has been seen by Dr Arnoldo Morale in the past however does not recall this physician. I'm sending her to a new physician, Dr. Sharlet Salina , DO at [facility] clinic to see if he is better able to control her cervical spine pain. Encouraged heat, capsaicin, icy hot to manage neck pain.        Other   Urinary frequency    Pleased to see the patient has had limited response oxybutynin with urinary frequency during the day much decreased. However, she still has persistent nocturia without any known medical underlying etiology. I placed a referral for urology for further evaluation and consult.Normal pelvic exam 02/09/16      Vision changes    I'm very reassured by normal neurologic exam. Patient does not have peripheral or central vision loss however she is due for an  eye exam and I have placed a referral for ophthalmology. I have also ordered MRI head to ensure no underlying pathology to explain symptom.       Relevant Orders   Ambulatory referral to Ophthalmology    Other Visit Diagnoses    Neck pain    -  Primary   Relevant Orders   Ambulatory referral to Orthopedic Surgery   MR Brain W Wo Contrast   DG Cervical Spine Complete   Routine general medical examination at a health care facility       Nocturia       Relevant Orders   Ambulatory referral to Urology       I am having Sandy Merritt maintain her multivitamin with minerals, aspirin, esomeprazole, hydroxychloroquine, traZODone, pregabalin, oxybutynin, and estrogens (conjugated).   No orders of the defined types were placed in this encounter.   Return precautions given.   Risks, benefits, and alternatives of the medications and treatment plan prescribed today were discussed, and patient expressed understanding.   Education regarding symptom management and diagnosis given to patient on AVS.  Continue to follow with Rica Mast, MD for routine health maintenance.   Sandy Merritt and I agreed with plan.   Mable Paris, FNP

## 2016-03-16 NOTE — Patient Instructions (Signed)
Referrals placed.   We will call you about MRI head.

## 2016-03-16 NOTE — Assessment & Plan Note (Addendum)
Pleased to see the patient has had limited response oxybutynin with urinary frequency during the day much decreased. However, she still has persistent nocturia without any known medical underlying etiology. I placed a referral for urology for further evaluation and consult.Normal pelvic exam 02/09/16

## 2016-03-16 NOTE — Assessment & Plan Note (Addendum)
Ordering cervical spine x-ray today to ensure no compression fracture due to bony tenderness appreciated on exam. Patient has been seen by Dr Arnoldo Morale in the past however does not recall this physician. I'm sending her to a new physician, Dr. Sharlet Salina , DO at [facility] clinic to see if he is better able to control her cervical spine pain. Encouraged heat, capsaicin, icy hot to manage neck pain.

## 2016-03-16 NOTE — Assessment & Plan Note (Signed)
I'm very reassured by normal neurologic exam. Patient does not have peripheral or central vision loss however she is due for an eye exam and I have placed a referral for ophthalmology. I have also ordered MRI head to ensure no underlying pathology to explain symptom.

## 2016-03-16 NOTE — Progress Notes (Signed)
Pre visit review using our clinic review tool, if applicable. No additional management support is needed unless otherwise documented below in the visit note. 

## 2016-03-18 ENCOUNTER — Encounter: Payer: Self-pay | Admitting: Family

## 2016-03-22 ENCOUNTER — Encounter: Payer: Self-pay | Admitting: Internal Medicine

## 2016-03-23 ENCOUNTER — Encounter: Payer: Self-pay | Admitting: Urology

## 2016-03-23 ENCOUNTER — Ambulatory Visit (INDEPENDENT_AMBULATORY_CARE_PROVIDER_SITE_OTHER): Payer: Medicare Other | Admitting: Urology

## 2016-03-23 VITALS — BP 147/75 | HR 63 | Ht 60.0 in | Wt 146.2 lb

## 2016-03-23 DIAGNOSIS — R350 Frequency of micturition: Secondary | ICD-10-CM | POA: Diagnosis not present

## 2016-03-23 DIAGNOSIS — N3281 Overactive bladder: Secondary | ICD-10-CM

## 2016-03-23 LAB — BLADDER SCAN AMB NON-IMAGING: Scan Result: 172

## 2016-03-23 MED ORDER — MIRABEGRON ER 25 MG PO TB24: 25.0000 mg | ORAL_TABLET | Freq: Every day | ORAL | 11 refills | Status: DC

## 2016-03-23 NOTE — Progress Notes (Signed)
03/23/2016 3:02 PM   [name] 1931-08-13 [id number]  Referring provider: [name], MD No address on file  Chief Complaint  Patient presents with  . Urinary Frequency    HPI: The patient is an 82 year old female presents for evaluation of nocturia. She was started on oxybutynin to not milligrams twice a day for daytime frequency and nocturia. Her frequency improved but she continues to have nocturia. She gets up sometimes once per hour.. She does note a fair amount of fluid intake prior to bedtime. She feels that she empties her bladder. She has a normal stream. She is more concerned about her neck today and thought this appointment was to discuss her neck pain.  PVR: 174 (patient voided one hour ago)   PMH: Past Medical History:  Diagnosis Date  . Arthritis    rheumatoid  . Chicken pox   . GERD (gastroesophageal reflux disease)   . Glaucoma   . Headaches, cluster   . Lupus ([place])   . Urine incontinence     Surgical History: Past Surgical History:  Procedure Laterality Date  . ABDOMINAL HYSTERECTOMY    . APPENDECTOMY  1970  . CERVICAL SPINE SURGERY     ant. neck/done x2/ screws out now  . CESAREAN SECTION     2  . CHOLECYSTECTOMY  1970  . FOOT SURGERY  years ago   "toes"/"heart stopped during surgery" per pt.only(not medical staff)  . TONSILLECTOMY AND ADENOIDECTOMY  1955  . VAGINAL DELIVERY     1    Home Medications:    Medication List       Accurate as of 03/23/16  3:02 PM. Always use your most recent med list.          aspirin 81 MG tablet Take 81 mg by mouth daily as needed for pain.   esomeprazole 40 MG capsule Commonly known as:  NEXIUM TAKE ONE CAPSULE BY MOUTH DAILY BEFORE BREAKFAST   estrogens (conjugated) 0.3 MG tablet Commonly known as:  PREMARIN Take 1 tablet (0.3 mg total) by mouth daily.   hydroxychloroquine 200 MG tablet Commonly known as:  PLAQUENIL Take 1 tablet (200 mg total) by mouth 2 (two) times daily.     mirabegron ER 25 MG Tb24 tablet Commonly known as:  MYRBETRIQ Take 1 tablet (25 mg total) by mouth daily.   multivitamin with minerals tablet Take 1 tablet by mouth daily.   oxybutynin 5 MG tablet Commonly known as:  DITROPAN Take 0.5 tablets (2.5 mg total) by mouth 2 (two) times daily.   pregabalin 150 MG capsule Commonly known as:  LYRICA Take 1 capsule (150 mg total) by mouth 2 (two) times daily.   traZODone 50 MG tablet Commonly known as:  DESYREL TAKE 1 TABLET  BY MOUTH AT BEDTIME AS NEEDED FOR SLEEP.       Allergies:  Allergies  Allergen Reactions  . Codeine Nausea Only    REACTION: nausea and vomiting REACTION: nausea and vomiting  . Propoxyphene Nausea Only    "darvon"....Marland KitchenREACTION: nausea and vomiting  . Propoxyphene Hcl     "darvon"....Marland KitchenREACTION: nausea and vomiting  . Propoxyphene N-Acetaminophen     Darvocet......Marland KitchenREACTION: nausea and vomiting    Family History: Family History  Problem Relation Age of Onset  . Arthritis Mother   . Diabetes Mother   . Heart disease Mother   . Arthritis Father   . Heart disease Father   . Heart attack Father   . Diabetes Grandchild   .  Drug abuse Maternal Aunt   . Colon cancer Neg Hx     Social History:  reports that she has never smoked. She has never used smokeless tobacco. She reports that she does not drink alcohol or use drugs.  ROS: UROLOGY Frequent Urination?: Yes Hard to postpone urination?: No Burning/pain with urination?: Yes Get up at night to urinate?: Yes Leakage of urine?: Yes Urine stream starts and stops?: No Trouble starting stream?: No Do you have to strain to urinate?: No Blood in urine?: No Urinary tract infection?: No Sexually transmitted disease?: No Injury to kidneys or bladder?: No Painful intercourse?: No Weak stream?: No Currently pregnant?: No Vaginal bleeding?: No Last menstrual period?: n  Gastrointestinal Nausea?: No Vomiting?: No Indigestion/heartburn?:  No Diarrhea?: No Constipation?: No  Constitutional Fever: No Night sweats?: No Weight loss?: No Fatigue?: No  Skin Skin rash/lesions?: No Itching?: No  Eyes Blurred vision?: No Double vision?: No  Ears/Nose/Throat Sore throat?: No Sinus problems?: No  Hematologic/Lymphatic Swollen glands?: No Easy bruising?: No  Cardiovascular Leg swelling?: No Chest pain?: No  Respiratory Cough?: No Shortness of breath?: No  Endocrine Excessive thirst?: No  Musculoskeletal Back pain?: No Joint pain?: No  Neurological Headaches?: No Dizziness?: No  Psychologic Depression?: No Anxiety?: No  Physical Exam: BP (!) 147/75   Pulse 63   Ht 5' (1.524 m)   Wt 146 lb 3.2 oz (66.3 kg)   BMI 28.55 kg/m   Constitutional:  Alert and oriented, No acute distress. HEENT: [place] AT, moist mucus membranes.  Trachea midline, no masses. Cardiovascular: No clubbing, cyanosis, or edema. Respiratory: Normal respiratory effort, no increased work of breathing. GI: Abdomen is soft, nontender, nondistended, no abdominal masses GU: No CVA tenderness.  Skin: No rashes, bruises or suspicious lesions. Lymph: No cervical or inguinal adenopathy. Neurologic: Grossly intact, no focal deficits, moving all 4 extremities. Psychiatric: Normal mood and affect.  Laboratory Data: Lab Results  Component Value Date   WBC 6.7 09/29/2015   HGB 11.6 (L) 09/29/2015   HCT 35.2 (L) 09/29/2015   MCV 82.1 09/29/2015   PLT 255.0 09/29/2015    Lab Results  Component Value Date   CREATININE 0.86 03/08/2016    No results found for: PSA  No results found for: TESTOSTERONE  Lab Results  Component Value Date   HGBA1C 5.8 03/08/2016    Urinalysis    Component Value Date/Time   COLORURINE YELLOW 03/01/2016 1159   APPEARANCEUR CLOUDY (A) 03/01/2016 1159   LABSPEC 1.010 03/01/2016 1159   PHURINE 7.0 03/01/2016 1159   GLUCOSEU NEGATIVE 03/01/2016 1159   HGBUR NEGATIVE 03/01/2016 1159   HGBUR negative  08/10/2010 1338   BILIRUBINUR negative 03/08/2016 1113   KETONESUR NEGATIVE 03/01/2016 1159   PROTEINUR 30 03/08/2016 1113   PROTEINUR NEGATIVE 03/01/2014 1347   UROBILINOGEN 0.2 03/08/2016 1113   UROBILINOGEN 0.2 03/01/2016 1159   NITRITE negative 03/08/2016 1113   NITRITE NEGATIVE 03/01/2016 1159   LEUKOCYTESUR Trace (A) 03/08/2016 1113     Assessment & Plan:    The patient is on a subtherapeutic dose of oxybutynin. Despite that, I do not like to use anticholinergics in patients over the age of 64, and do not think this dose should be increased due to its side effects especially altered mental status. We'll discontinue this medication. We'll start the patient on Myrbetriq. Samples were given. She'll follow-up in 3 months. She was instructed that this medication can take over 4 weeks to have its maximal effect.  1. Overactive bladder -  Start Myrbetriq 25 mg daily -d/c oxybutynin -decrease PM fluid intake -follow up in 3 months   Return in about 3 months (around 06/22/2016).  [name], MD  [facility] [address], [place],  [address] [phone]

## 2016-03-27 LAB — COLOGUARD: Cologuard: NEGATIVE

## 2016-03-31 ENCOUNTER — Ambulatory Visit
Admission: RE | Admit: 2016-03-31 | Discharge: 2016-03-31 | Disposition: A | Discharge status: Home / Self Care | Payer: Medicare Other | Source: Ambulatory Visit | Attending: Family | Admitting: Family

## 2016-03-31 DIAGNOSIS — G319 Degenerative disease of nervous system, unspecified: Secondary | ICD-10-CM | POA: Insufficient documentation

## 2016-03-31 DIAGNOSIS — H539 Unspecified visual disturbance: Secondary | ICD-10-CM | POA: Insufficient documentation

## 2016-03-31 DIAGNOSIS — M542 Cervicalgia: Secondary | ICD-10-CM | POA: Diagnosis not present

## 2016-03-31 MED ORDER — GADOBENATE DIMEGLUMINE 529 MG/ML IV SOLN
15.0000 mL | Freq: Once | INTRAVENOUS | Status: AC | PRN
  Administered 2016-03-31: 13 mL via INTRAVENOUS

## 2016-04-08 ENCOUNTER — Telehealth: Payer: Self-pay | Admitting: *Deleted

## 2016-04-08 NOTE — Telephone Encounter (Signed)
Tried to leave message but patient phone keeps ringing.  Her last test at [facility] was an MRI of her brain.

## 2016-04-08 NOTE — Telephone Encounter (Signed)
Pt stated that she had testing at [facility], she was not sure of the  Name of testing. She requested to have the results of her latest test  Pt contact [phone]

## 2016-04-08 NOTE — Telephone Encounter (Signed)
Left message for patient to return call back.  

## 2016-04-13 NOTE — Telephone Encounter (Signed)
Left message for patient to return call back.  

## 2016-04-14 NOTE — Telephone Encounter (Signed)
Letter has been mailed.

## 2016-04-18 ENCOUNTER — Telehealth: Payer: Self-pay

## 2016-04-18 NOTE — Telephone Encounter (Signed)
Please call patient-  I resulted this to [employer] a month ago-  [place],   [place] news- MRI brain looks great. Normal. I do not see any causes for blurry vision or neck pain. I would like for you to follow up with your eye doctor as soon as possible. However if you pain worsens, please don't hesitate letting me know and make an appointment.   Look forward to seeing you at follow and hope you are doing well.   Take care,  Joycelyn Schmid, NP

## 2016-04-18 NOTE — Telephone Encounter (Signed)
Pt called back returning your call. Thank you! °

## 2016-04-18 NOTE — Telephone Encounter (Signed)
Patient called wanting results from her brain MRI. Please advise.

## 2016-04-18 NOTE — Telephone Encounter (Signed)
Left message for patient to return call back.  

## 2016-04-18 NOTE — Telephone Encounter (Signed)
Patient was informed of results.  Patient understood and no questions, comments, or concerns at this time.  

## 2016-05-03 ENCOUNTER — Telehealth: Payer: Self-pay | Admitting: Family

## 2016-05-03 ENCOUNTER — Encounter: Payer: Self-pay | Admitting: Family

## 2016-05-03 ENCOUNTER — Ambulatory Visit (INDEPENDENT_AMBULATORY_CARE_PROVIDER_SITE_OTHER): Payer: Medicare Other | Admitting: Family

## 2016-05-03 VITALS — BP 152/88 | HR 73 | Temp 97.5°F | Wt 147.8 lb

## 2016-05-03 DIAGNOSIS — I10 Essential (primary) hypertension: Secondary | ICD-10-CM | POA: Diagnosis not present

## 2016-05-03 DIAGNOSIS — L298 Other pruritus: Secondary | ICD-10-CM | POA: Diagnosis not present

## 2016-05-03 DIAGNOSIS — R8290 Unspecified abnormal findings in urine: Secondary | ICD-10-CM | POA: Diagnosis not present

## 2016-05-03 DIAGNOSIS — Z23 Encounter for immunization: Secondary | ICD-10-CM | POA: Diagnosis not present

## 2016-05-03 DIAGNOSIS — N898 Other specified noninflammatory disorders of vagina: Secondary | ICD-10-CM | POA: Insufficient documentation

## 2016-05-03 DIAGNOSIS — R1030 Lower abdominal pain, unspecified: Secondary | ICD-10-CM

## 2016-05-03 LAB — POCT URINALYSIS DIPSTICK
BILIRUBIN UA: NEGATIVE
GLUCOSE UA: NEGATIVE
Ketones, UA: NEGATIVE
Leukocytes, UA: NEGATIVE
Nitrite, UA: NEGATIVE
SPEC GRAV UA: 1.02
Urobilinogen, UA: 0.2
pH, UA: 6

## 2016-05-03 NOTE — Patient Instructions (Signed)
You are on the lowest dose of Premarin orally. To start to wean herself off, try taking just twice to 3 times a week and see how you feel. At that point, we can discuss coming off completely if you  are feeling okay. We will also consider referral to GYN if symptoms persist.   Will call with lab results.   If there is no improvement in your symptoms, or if there is any worsening of symptoms, or if you have any additional concerns, please return for re-evaluation; or, if we are closed, consider going to the Emergency Room for evaluation if symptoms urgent.

## 2016-05-03 NOTE — Progress Notes (Signed)
Subjective:    Patient ID: Sandy Merritt, female    DOB: 1932/02/10, 82 y.o.   MRN: [id number]  CC: Sandy Merritt is a 82 y.o. female who presents today for follow up.   HPI: Patient here for follow-up for ongoing "vaginal issues" for itching, irritation and for new complaint of mild, intermittent abdominal pain.   No vaginal bleeding. Endorses dysuria. No vaginal or odor or increase in discharge. Continues to take Premarin PO which she started after hysterectomy many years ago. On mybetriq which is helping with urinary frequency.   Notes an occasional, mild lower abdominal pain for a few seconds in lower abdomen. No pain today. Unchanged over past couple of weeks. Alternates between right and left side. Hasn't noted any triggers including food. Passing regular BMs.  H/o of complete hysterectomy. No flank pain, abdominal bloating.   HTN- elevated today. Denies exertional chest pain or pressure, numbness or tingling radiating to left arm or jaw, palpitations, dizziness, frequent headaches, changes in vision, or shortness of breath.    Chart review:  She was seen for similar issues 8/22 with chief complaint of itching and irritation. We treated with ciprofloxacin for UTI 2 months ago. She also completed Flagyl and Diflucan 3 months ago for suspected candidiasis and BV.    HISTORY:  Past Medical History:  Diagnosis Date  . Arthritis    rheumatoid  . Chicken pox   . GERD (gastroesophageal reflux disease)   . Glaucoma   . Headaches, cluster   . Lupus   . Urine incontinence    Past Surgical History:  Procedure Laterality Date  . ABDOMINAL HYSTERECTOMY    . APPENDECTOMY  1970  . CERVICAL SPINE SURGERY     ant. neck/done x2/ screws out now  . CESAREAN SECTION     2  . CHOLECYSTECTOMY  1970  . FOOT SURGERY  years ago   "toes"/"heart stopped during surgery" per pt.only(not medical staff)  . TONSILLECTOMY AND ADENOIDECTOMY  1955  . VAGINAL DELIVERY     1   Family  History  Problem Relation Age of Onset  . Arthritis Mother   . Diabetes Mother   . Heart disease Mother   . Arthritis Father   . Heart disease Father   . Heart attack Father   . Diabetes Grandchild   . Drug abuse Maternal Aunt   . Colon cancer Neg Hx     Allergies: Codeine; Propoxyphene; Propoxyphene hcl; and Propoxyphene n-acetaminophen Current Outpatient Prescriptions on File Prior to Visit  Medication Sig Dispense Refill  . aspirin 81 MG tablet Take 81 mg by mouth daily as needed for pain.     Marland Kitchen esomeprazole (NEXIUM) 40 MG capsule TAKE ONE CAPSULE BY MOUTH DAILY BEFORE BREAKFAST (Patient taking differently: Take 40 mg by mouth daily as needed. TAKE ONE CAPSULE BY MOUTH DAILY BEFORE BREAKFAST) 30 capsule 11  . estrogens, conjugated, (PREMARIN) 0.3 MG tablet Take 1 tablet (0.3 mg total) by mouth daily. 30 tablet 3  . mirabegron ER (MYRBETRIQ) 25 MG TB24 tablet Take 1 tablet (25 mg total) by mouth daily. 30 tablet 11  . Multiple Vitamins-Minerals (MULTIVITAMIN WITH MINERALS) tablet Take 1 tablet by mouth daily.    . pregabalin (LYRICA) 150 MG capsule Take 1 capsule (150 mg total) by mouth 2 (two) times daily. 60 capsule 1  . traZODone (DESYREL) 50 MG tablet TAKE 1 TABLET  BY MOUTH AT BEDTIME AS NEEDED FOR SLEEP. 30 tablet 3   No current facility-administered  medications on file prior to visit.     Social History  Substance Use Topics  . Smoking status: Never Smoker  . Smokeless tobacco: Never Used  . Alcohol use No    Review of Systems  Constitutional: Negative for chills and fever.  Respiratory: Negative for cough.   Cardiovascular: Negative for chest pain and palpitations.  Gastrointestinal: Negative for abdominal distention, abdominal pain, blood in stool, constipation, nausea and vomiting.  Genitourinary: Positive for dysuria. Negative for flank pain, frequency, urgency, vaginal bleeding, vaginal discharge and vaginal pain.      Objective:    BP (!) 152/88   Pulse 73    Temp 97.5 F (36.4 C) (Oral)   Wt 147 lb 12.8 oz (67 kg)   SpO2 96%   BMI 28.87 kg/m  BP Readings from Last 3 Encounters:  05/03/16 (!) 152/88  03/23/16 (!) 147/75  03/16/16 128/78   Wt Readings from Last 3 Encounters:  05/03/16 147 lb 12.8 oz (67 kg)  03/23/16 146 lb 3.2 oz (66.3 kg)  03/16/16 147 lb 12.8 oz (67 kg)    Physical Exam  Constitutional: She appears well-developed and well-nourished.  Eyes: Conjunctivae are normal.  Cardiovascular: Normal rate, regular rhythm, normal heart sounds and normal pulses.   Pulmonary/Chest: Effort normal and breath sounds normal. She has no wheezes. She has no rhonchi. She has no rales.  Abdominal: Soft. Normal appearance and bowel sounds are normal. She exhibits no distension, no fluid wave, no ascites and no mass. There is no tenderness. There is no rigidity, no rebound, no guarding and no CVA tenderness.  Genitourinary: There is no rash, tenderness or lesion on the right labia. There is no rash, tenderness or lesion on the left labia. Right adnexum displays no mass and no tenderness. Left adnexum displays no mass and no tenderness. No erythema, tenderness or bleeding in the vagina. No foreign body in the vagina. No vaginal discharge found.  Genitourinary Comments: No cervix. No motion tenderness with bimanual exam. No vulvovaginal erythema. No lesions. Discharge is thin and clear, not purulent.   Neurological: She is alert.  Skin: Skin is warm and dry.  Psychiatric: She has a normal mood and affect. Her speech is normal and behavior is normal. Thought content normal.  Vitals reviewed.      Assessment & Plan:   Problem List Items Addressed This Visit      Cardiovascular and Mediastinum   Essential hypertension    Elevated. Will continue to monitor. No signs or symptoms of hypertensive urgency or emergency at this time.        Musculoskeletal and Integument   Vaginal itching - Primary    UA shows trace blood, negative nitrites  leukocytes. pending wet prep to evaluate for BV, candidiasis. Notably vaginal itching may be because of postmenopausal atrophic vaginitis and she may benefit from vaginal estrogen. We discussed a trial of decreasing her Premarin by mouth and patient will follow-up a couple weeks to see how she's doing with this. We discussed the risks of Premarin by mouth and how Premarin vaginal may offer less risk with more benefit based on patient symptoms.      Relevant Orders   POCT urinalysis dipstick (Completed)   WET PREP BY MOLECULAR PROBE   Urine Culture     Other   Lower abdominal pain    New. Benign normal abdominal exam. Patient and I jointly decided since pain has unchanged, mild, and not present today, we will continue to observe symptom.  If recurs or becomes more frequent, patient will let me know and we will further evaluate at that time.        Other Visit Diagnoses    Encounter for immunization       Relevant Orders   Flu vaccine HIGH DOSE PF (Completed)   WET PREP BY MOLECULAR PROBE   Abnormal result on screening urine test       Relevant Orders   Urine Culture       I have discontinued Ms. Brunet's hydroxychloroquine and oxybutynin. I am also having her maintain her multivitamin with minerals, aspirin, esomeprazole, traZODone, pregabalin, estrogens (conjugated), and mirabegron ER.   No orders of the defined types were placed in this encounter.   Return precautions given.   Risks, benefits, and alternatives of the medications and treatment plan prescribed today were discussed, and patient expressed understanding.   Education regarding symptom management and diagnosis given to patient on AVS.  Continue to follow with Mable Paris, FNP for routine health maintenance.   Sandy Merritt and I agreed with plan.   Mable Paris, FNP

## 2016-05-03 NOTE — Assessment & Plan Note (Addendum)
UA shows trace blood, negative nitrites leukocytes. Will wait on culture prior to treating for UTI.  pending wet prep to evaluate for BV, candidiasis. Notably vaginal itching may be because of postmenopausal atrophic vaginitis and she may benefit from vaginal estrogen. We discussed a trial of decreasing her Premarin by mouth and patient will follow-up a couple weeks to see how she's doing with this. We discussed the risks of Premarin by mouth and how Premarin vaginal may offer less risk with more benefit based on patient symptoms.

## 2016-05-03 NOTE — Progress Notes (Signed)
Patients BP was 144/64 at home

## 2016-05-03 NOTE — Telephone Encounter (Signed)
Pt called to give to you her BP numbers.   Call pt @ [phone]. Thank you!

## 2016-05-03 NOTE — Assessment & Plan Note (Addendum)
Elevated. At her age, goal of BP is closer to SBP 150 than 140.  Will continue to monitor. No signs or symptoms of hypertensive urgency or emergency at this time.

## 2016-05-03 NOTE — Telephone Encounter (Signed)
Patient has lost paper. She will be calling back after another reading.

## 2016-05-03 NOTE — Progress Notes (Signed)
Patient is going to take BP then call us back to give [place] the numbers.

## 2016-05-03 NOTE — Assessment & Plan Note (Signed)
New. Benign normal abdominal exam. Patient and I jointly decided since pain has unchanged, mild, and not present today, we will continue to observe symptom. If recurs or becomes more frequent, patient will let me know and we will further evaluate at that time.

## 2016-05-03 NOTE — Telephone Encounter (Signed)
144/64 was patients BP at home.

## 2016-05-03 NOTE — Progress Notes (Signed)
Pre visit review using our clinic review tool, if applicable. No additional management support is needed unless otherwise documented below in the visit note. 

## 2016-05-04 LAB — WET PREP BY MOLECULAR PROBE
CANDIDA SPECIES: NEGATIVE
Gardnerella vaginalis: NEGATIVE
Trichomonas vaginosis: NEGATIVE

## 2016-05-04 LAB — URINE CULTURE: Organism ID, Bacteria: NO GROWTH

## 2016-05-04 NOTE — Telephone Encounter (Signed)
Labs has been given by CMA

## 2016-05-14 ENCOUNTER — Other Ambulatory Visit: Payer: Self-pay | Admitting: Family

## 2016-05-14 DIAGNOSIS — G609 Hereditary and idiopathic neuropathy, unspecified: Secondary | ICD-10-CM

## 2016-05-23 ENCOUNTER — Ambulatory Visit: Payer: Medicare Other | Admitting: Family

## 2016-05-23 ENCOUNTER — Other Ambulatory Visit: Payer: Self-pay | Admitting: Family Medicine

## 2016-05-24 NOTE — Telephone Encounter (Signed)
Please call patient- needs to see rheumatology for refill of medication for lupus . You may place referral if patient would like to continue medication.

## 2016-05-24 NOTE — Telephone Encounter (Signed)
Medication discontinued for pt not taking has of 05/03/16. Pt last seen 05/03/16. Please advise?

## 2016-05-25 ENCOUNTER — Other Ambulatory Visit: Payer: Self-pay | Admitting: Family

## 2016-05-25 DIAGNOSIS — G609 Hereditary and idiopathic neuropathy, unspecified: Secondary | ICD-10-CM

## 2016-05-25 NOTE — Telephone Encounter (Signed)
lov 03/16/16, last seen by you 05/03/16 for acute issue  No office scheduled, patient of Dr Thomes Dinning

## 2016-05-25 NOTE — Telephone Encounter (Signed)
Please advise on refill, thanks. 

## 2016-05-25 NOTE — Telephone Encounter (Signed)
[facility] called requesting a refill on pregabalin (LYRICA) 150 MG capsule. Thank you!  Call pt @ [id number] - [place], [place]

## 2016-05-26 NOTE — Telephone Encounter (Signed)
Would you call and get more info as to why patient takes med?   We didn't discuss at our visit so just want to be sure I understand why she is on it.

## 2016-05-26 NOTE — Telephone Encounter (Signed)
Called and spoke with the pt to find out why she is taking the Lyrica.  Pt stated that the doctor put her on it.  Asked her if she was taking the Lyrica for her neck pain, and she stated that she was.  Asked the pt if the medication was helping her neck pain, and she stated that the medication was not helping her neck pain.

## 2016-05-27 ENCOUNTER — Other Ambulatory Visit: Payer: Self-pay | Admitting: Family

## 2016-05-27 DIAGNOSIS — G609 Hereditary and idiopathic neuropathy, unspecified: Secondary | ICD-10-CM

## 2016-05-27 NOTE — Telephone Encounter (Signed)
Received a refill request for Hydroxychloroquine 200mg  tab #180.  Medication not on med list on past medication hx.

## 2016-06-01 ENCOUNTER — Other Ambulatory Visit: Payer: Self-pay | Admitting: Family Medicine

## 2016-06-22 ENCOUNTER — Other Ambulatory Visit: Payer: Self-pay | Admitting: Family

## 2016-06-23 ENCOUNTER — Ambulatory Visit

## 2016-06-30 ENCOUNTER — Ambulatory Visit: Admitting: Urology

## 2016-06-30 ENCOUNTER — Encounter: Payer: Self-pay | Admitting: Urology

## 2017-01-12 ENCOUNTER — Telehealth (INDEPENDENT_AMBULATORY_CARE_PROVIDER_SITE_OTHER): Payer: Self-pay | Admitting: Rheumatology

## 2017-01-12 NOTE — Telephone Encounter (Signed)
[facility] RECORDS FAXED TO [place] [phone]

## 2017-01-13 NOTE — Telephone Encounter (Signed)
I received VM from [name], patient daughter checking if we had received request for records from Rheum office in [place]. I called her back and LMVM advising I faxed records yesterday [facility]

## 2017-09-19 ENCOUNTER — Telehealth: Payer: Self-pay | Admitting: Family

## 2017-09-19 NOTE — Telephone Encounter (Signed)
[facility]   In reviewing your chart, it appears your are due for annual mammogram.You may already have scheduled- if so, you may disregard this letter.   Please let us know if you would like for me to order. You may call the office to let us know, and we will order for you.   Once the order in the system, you may schedule at your preferred location.   Typically women have been using one of the sites below however you may go where you have been in the past. I would ensure that when you do get a mammogram that it is 3D ( as opposed to 2D which was prior technology). Evidence suggests that 3D is superior.   Please note that NOT all insurance companies cover 3D, and you may have to pay a higher copay. You may call your insurance company to further clarify your benefits.   Options for Mammogram in [place]:    [facility]  [place], [place]   [facility] [place], [place]   Let us know if you have questions.   My best,   Mable Paris, NP

## 2017-09-19 NOTE — Telephone Encounter (Signed)
Letter mailed

## 2019-04-15 ENCOUNTER — Telehealth: Payer: Self-pay | Admitting: Family

## 2019-04-15 NOTE — Telephone Encounter (Signed)
Spoke to pt daughter. Pt is living with her in [place], [place] for now due to Covid-19. Was trying to schedule AWV but daughter states pt could not carry on a phone conversation with out her there due to her dementia getting worse. It could still be a few months before they can get pt into another nursing home in [place]

## 2019-12-06 ENCOUNTER — Emergency Department (HOSPITAL_COMMUNITY): Payer: Medicare PPO

## 2019-12-06 ENCOUNTER — Emergency Department (HOSPITAL_COMMUNITY)
Admission: EM | Admit: 2019-12-06 | Discharge: 2019-12-06 | Disposition: A | Discharge status: Skilled Nursing Facility | Payer: Medicare PPO | Attending: Emergency Medicine | Admitting: Emergency Medicine

## 2019-12-06 ENCOUNTER — Emergency Department (HOSPITAL_BASED_OUTPATIENT_CLINIC_OR_DEPARTMENT_OTHER): Payer: Medicare PPO

## 2019-12-06 ENCOUNTER — Encounter (HOSPITAL_COMMUNITY): Payer: Self-pay | Admitting: Emergency Medicine

## 2019-12-06 ENCOUNTER — Other Ambulatory Visit: Payer: Self-pay

## 2019-12-06 DIAGNOSIS — F0390 Unspecified dementia without behavioral disturbance: Secondary | ICD-10-CM | POA: Diagnosis not present

## 2019-12-06 DIAGNOSIS — W19XXXA Unspecified fall, initial encounter: Secondary | ICD-10-CM | POA: Diagnosis not present

## 2019-12-06 DIAGNOSIS — Y92129 Unspecified place in nursing home as the place of occurrence of the external cause: Secondary | ICD-10-CM | POA: Insufficient documentation

## 2019-12-06 DIAGNOSIS — S0990XA Unspecified injury of head, initial encounter: Secondary | ICD-10-CM | POA: Diagnosis present

## 2019-12-06 DIAGNOSIS — R2241 Localized swelling, mass and lump, right lower limb: Secondary | ICD-10-CM | POA: Diagnosis not present

## 2019-12-06 DIAGNOSIS — Z79899 Other long term (current) drug therapy: Secondary | ICD-10-CM | POA: Insufficient documentation

## 2019-12-06 DIAGNOSIS — S0083XA Contusion of other part of head, initial encounter: Secondary | ICD-10-CM | POA: Diagnosis not present

## 2019-12-06 DIAGNOSIS — R609 Edema, unspecified: Secondary | ICD-10-CM | POA: Diagnosis not present

## 2019-12-06 DIAGNOSIS — M3210 Systemic lupus erythematosus, organ or system involvement unspecified: Secondary | ICD-10-CM | POA: Insufficient documentation

## 2019-12-06 DIAGNOSIS — Y9389 Activity, other specified: Secondary | ICD-10-CM | POA: Insufficient documentation

## 2019-12-06 DIAGNOSIS — Y999 Unspecified external cause status: Secondary | ICD-10-CM | POA: Insufficient documentation

## 2019-12-06 LAB — URINALYSIS, ROUTINE W REFLEX MICROSCOPIC
Bilirubin Urine: NEGATIVE
Glucose, UA: NEGATIVE mg/dL
Hgb urine dipstick: NEGATIVE
Ketones, ur: NEGATIVE mg/dL
Leukocytes,Ua: NEGATIVE
Nitrite: NEGATIVE
Protein, ur: NEGATIVE mg/dL
Specific Gravity, Urine: 1.013 (ref 1.005–1.030)
pH: 6 (ref 5.0–8.0)

## 2019-12-06 LAB — COMPREHENSIVE METABOLIC PANEL
ALT: 20 U/L (ref 0–44)
AST: 24 U/L (ref 15–41)
Albumin: 4 g/dL (ref 3.5–5.0)
Alkaline Phosphatase: 53 U/L (ref 38–126)
Anion gap: 9 (ref 5–15)
BUN: 17 mg/dL (ref 8–23)
CO2: 26 mmol/L (ref 22–32)
Calcium: 8.7 mg/dL — ABNORMAL LOW (ref 8.9–10.3)
Chloride: 104 mmol/L (ref 98–111)
Creatinine, Ser: 0.85 mg/dL (ref 0.44–1.00)
GFR calc Af Amer: >60 mL/min (ref >60)
GFR calc non Af Amer: >60 mL/min (ref >60)
Glucose, Bld: 151 mg/dL — ABNORMAL HIGH (ref 70–99)
Potassium: 3.9 mmol/L (ref 3.5–5.1)
Sodium: 139 mmol/L (ref 135–145)
Total Bilirubin: 0.5 mg/dL (ref 0.3–1.2)
Total Protein: 7.5 g/dL (ref 6.5–8.1)

## 2019-12-06 LAB — CBC WITH DIFFERENTIAL/PLATELET
Abs Immature Granulocytes: 0.08 10*3/uL — ABNORMAL HIGH (ref 0.00–0.07)
Basophils Absolute: 0.1 10*3/uL (ref 0.0–0.1)
Basophils Relative: 0 %
Eosinophils Absolute: 0.1 10*3/uL (ref 0.0–0.5)
Eosinophils Relative: 0 %
HCT: 40.8 % (ref 36.0–46.0)
Hemoglobin: 13 g/dL (ref 12.0–15.0)
Immature Granulocytes: 1 %
Lymphocytes Relative: 10 %
Lymphs Abs: 1.3 10*3/uL (ref 0.7–4.0)
MCH: 29.3 pg (ref 26.0–34.0)
MCHC: 31.9 g/dL (ref 30.0–36.0)
MCV: 92.1 fL (ref 80.0–100.0)
Monocytes Absolute: 0.7 10*3/uL (ref 0.1–1.0)
Monocytes Relative: 6 %
Neutro Abs: 11 10*3/uL — ABNORMAL HIGH (ref 1.7–7.7)
Neutrophils Relative %: 83 %
Platelets: 271 10*3/uL (ref 150–400)
RBC: 4.43 MIL/uL (ref 3.87–5.11)
RDW: 13.3 % (ref 11.5–15.5)
WBC: 13.3 10*3/uL — ABNORMAL HIGH (ref 4.0–10.5)
nRBC: 0 % (ref 0.0–0.2)

## 2019-12-06 LAB — TROPONIN I (HIGH SENSITIVITY): Troponin I (High Sensitivity): 7 ng/L (ref <18)

## 2019-12-06 NOTE — ED Provider Notes (Signed)
[facility] DEPT Provider Note   CSN: [id number] Arrival date & time: 12/06/19  [id number]     History Chief Complaint  Patient presents with  . Fall    Sandy Merritt is a 86 y.o. female.  The history is provided by the patient, medical records, a relative and the nursing home. No language interpreter was used.   Sandy Merritt is a 86 y.o. female who presents to the Emergency Department complaining of fall.  Level V caveat due to dementia.  Hx is provided by EMS.  She is a resident at [facility] and was found down on the ground face down. She was calling out when she fell. Nursing facility staff states that she has been slightly more unsteady when walking over the last few days and is concerned that she might have a urinary tract infection. No reports of fevers, nausea, vomiting, diarrhea.    Past Medical History:  Diagnosis Date  . Arthritis    rheumatoid  . Chicken pox   . GERD (gastroesophageal reflux disease)   . Glaucoma   . Headaches, cluster   . Lupus ([place])   . Urine incontinence     Patient Active Problem List   Diagnosis Date Noted  . Vaginal itching 05/03/2016  . Vision changes 03/16/2016  . Vaginal irritation 03/01/2016  . Fatigue 09/29/2015  . Degenerative arthritis of cervical spine 08/04/2015  . Urinary frequency 05/24/2015  . Toenail deformity 03/03/2015  . Basal cell carcinoma of skin of breast 03/03/2015  . Insomnia 07/31/2014  . Unspecified hereditary and idiopathic peripheral neuropathy 04/04/2014  . Benign paroxysmal positional vertigo 03/20/2014  . Vertigo 03/01/2014  . Lower abdominal pain 03/01/2014  . Anemia, unspecified 01/23/2014  . Lupus ([place]) 05/16/2011  . ASTHMA 08/11/2010  . HYPERLIPIDEMIA 08/10/2010  . ESOPHAGEAL STRICTURE 06/25/2008  . GERD 06/25/2008  . ANXIETY, CHRONIC, HX OF 06/25/2008  . MITRAL VALVE PROLAPSE, HX OF 06/25/2008  . Essential hypertension 01/30/2008  . DIVERTICULOSIS, COLON  01/30/2008  . OSTEOPENIA 01/30/2008    Past Surgical History:  Procedure Laterality Date  . ABDOMINAL HYSTERECTOMY    . APPENDECTOMY  1970  . CERVICAL SPINE SURGERY     ant. neck/done x2/ screws out now  . CESAREAN SECTION     2  . CHOLECYSTECTOMY  1970  . FOOT SURGERY  years ago   "toes"/"heart stopped during surgery" per pt.only(not medical staff)  . TONSILLECTOMY AND ADENOIDECTOMY  1955  . VAGINAL DELIVERY     1     OB History   No obstetric history on file.     Family History  Problem Relation Age of Onset  . Arthritis Mother   . Diabetes Mother   . Heart disease Mother   . Arthritis Father   . Heart disease Father   . Heart attack Father   . Diabetes Grandchild   . Drug abuse Maternal Aunt   . Colon cancer Neg Hx     Social History   Tobacco Use  . Smoking status: Never Smoker  . Smokeless tobacco: Never Used  Substance Use Topics  . Alcohol use: No  . Drug use: No    Home Medications Prior to Admission medications   Medication Sig Start Date End Date Taking? Authorizing Provider  acetaminophen (TYLENOL) 325 MG tablet Take 650 mg by mouth every 8 (eight) hours as needed for mild pain or headache.   Yes [provider]  alendronate (FOSAMAX) 70 MG tablet Take 70  mg by mouth once a week. Take with a full glass of water on an empty stomach.   Yes [provider]  b complex vitamins capsule Take 1 capsule by mouth daily.   Yes [provider]  Cholecalciferol (VITAMIN D3) 25 MCG (1000 UT) CAPS Take 1,000 Units by mouth daily.   Yes [provider]  donepezil (ARICEPT) 10 MG tablet Take 10 mg by mouth at bedtime.   Yes [provider]  famotidine (PEPCID) 20 MG tablet Take 20 mg by mouth at bedtime.   Yes [provider]  melatonin 5 MG TABS Take 5 mg by mouth at bedtime.   Yes [provider]  mirtazapine (REMERON) 7.5 MG tablet Take 7.5 mg by mouth at bedtime.   Yes [provider]    risperiDONE (RISPERDAL) 0.25 MG tablet Take 0.125 mg by mouth every 6 (six) hours as needed (agitation, agression).   Yes [provider]  risperiDONE (RISPERDAL) 0.5 MG tablet Take 0.5 mg by mouth 2 (two) times daily.   Yes [provider]  sertraline (ZOLOFT) 25 MG tablet Take 25 mg by mouth daily.   Yes [provider]  esomeprazole (NEXIUM) 40 MG capsule TAKE ONE CAPSULE BY MOUTH DAILY BEFORE BREAKFAST Patient not taking: Reported on 12/06/2019 07/31/14   Sandy Confer, MD  estrogens, conjugated, (PREMARIN) 0.3 MG tablet Take 1 tablet (0.3 mg total) by mouth daily. Patient not taking: Reported on 12/06/2019 03/08/16   [name], FNP  mirabegron ER (MYRBETRIQ) 25 MG TB24 tablet Take 1 tablet (25 mg total) by mouth daily. Patient not taking: Reported on 12/06/2019 03/23/16   [name], MD  pregabalin (LYRICA) 150 MG capsule Take 1 capsule (150 mg total) by mouth 2 (two) times daily. Patient not taking: Reported on 12/06/2019 03/06/16   [name], FNP  traZODone (DESYREL) 50 MG tablet TAKE 1 TABLET  BY MOUTH AT BEDTIME AS NEEDED FOR SLEEP. Patient not taking: Reported on 12/06/2019 03/06/16   [name], FNP    Allergies    Codeine, Propoxyphene, Propoxyphene hcl, and Propoxyphene n-acetaminophen  Review of Systems   Review of Systems  All other systems reviewed and are negative.   Physical Exam Updated Vital Signs BP 131/62   Pulse 72   Temp 98.1 F (36.7 C) (Oral)   Resp 19   Ht 5' (1.524 m)   Wt 68 kg   SpO2 98%   BMI 29.28 kg/m   Physical Exam Vitals and nursing note reviewed.  Constitutional:      Appearance: She is well-developed.  HENT:     Head: Normocephalic.     Comments: Small area of ecchymosis to the central forehead Cardiovascular:     Rate and Rhythm: Normal rate and regular rhythm.     Heart sounds: No murmur.  Pulmonary:     Effort: Pulmonary effort is normal. No respiratory distress.      Breath sounds: Normal breath sounds.  Abdominal:     Palpations: Abdomen is soft.     Tenderness: There is no abdominal tenderness. There is no guarding or rebound.  Musculoskeletal:        General: No tenderness.     Comments: None pitting edema to the right lower extremity. 2+ DP pulses bilaterally. No hip tenderness to palpation. Able to passively range bilateral hips.  Skin:    General: Skin is warm and dry.  Neurological:     Mental Status: She is alert.  Comments: Oriented to self. Disoriented to place, time and recent events. Moves all extremities symmetrically and weakly but refuses to follow commands (states no when asked to perform tasks).    Psychiatric:     Comments: Tearful, anxious     ED Results / Procedures / Treatments   Labs (all labs ordered are listed, but only abnormal results are displayed) Labs Reviewed  COMPREHENSIVE METABOLIC PANEL - Abnormal; Notable for the following components:      Result Value   Glucose, Bld 151 (*)    Calcium 8.7 (*)    All other components within normal limits  CBC WITH DIFFERENTIAL/PLATELET - Abnormal; Notable for the following components:   WBC 13.3 (*)    Neutro Abs 11.0 (*)    Abs Immature Granulocytes 0.08 (*)    All other components within normal limits  URINE CULTURE  URINALYSIS, ROUTINE W REFLEX MICROSCOPIC  TROPONIN I (HIGH SENSITIVITY)    EKG None  Radiology DG Chest 2 View  Result Date: 12/06/2019 CLINICAL DATA:  Altered mental status. Fell. EXAM: CHEST - 2 VIEW COMPARISON:  07/08/2015 FINDINGS: Interval mildly enlarged cardiac silhouette. Poor inspiration with crowding of the lung markings. Mildly prominent pulmonary vasculature. No pleural fluid. Cervical spine fixation hardware. Mild scoliosis. Old, healed left humeral head and neck fracture. IMPRESSION: Interval mildly enlarged cardiac silhouette and mild pulmonary vascular congestion. Electronically Signed   By: Claudie Revering M.D.   On: 12/06/2019 09:56    CT Head Wo Contrast  Result Date: 12/06/2019 CLINICAL DATA:  Unwitnessed fall. EXAM: CT HEAD WITHOUT CONTRAST CT CERVICAL SPINE WITHOUT CONTRAST TECHNIQUE: Multidetector CT imaging of the head and cervical spine was performed following the standard protocol without intravenous contrast. Multiplanar CT image reconstructions of the cervical spine were also generated. COMPARISON:  September 28, 2015. FINDINGS: CT HEAD FINDINGS Brain: Mild diffuse cortical atrophy is noted. No mass effect or midline shift is noted. Ventricular size is within normal limits. There is no evidence of mass lesion, hemorrhage or acute infarction. Vascular: No hyperdense vessel or unexpected calcification. Skull: Normal. Negative for fracture or focal lesion. Sinuses/Orbits: No acute finding. Other: None. CT CERVICAL SPINE FINDINGS Alignment: Normal. Skull base and vertebrae: Status post corpectomy of C4 and C5. Status post surgical anterior fusion extending from C3 to C7 with surgical screws present at these levels. No acute fracture is noted. Soft tissues and spinal canal: No prevertebral fluid or swelling. No visible canal hematoma. Disc levels: Postsurgical changes as described above. Fusion of C6-7 disc space is noted most likely due to surgery. Upper chest: Negative. Other: Degenerative changes are seen involving several posterior facet joints on the left. IMPRESSION: 1. Mild diffuse cortical atrophy. No acute intracranial abnormality seen. 2. Postsurgical and degenerative changes as described above. No acute abnormality seen in the cervical spine. Electronically Signed   By: Marijo Conception M.D.   On: 12/06/2019 09:36   CT Cervical Spine Wo Contrast  Result Date: 12/06/2019 CLINICAL DATA:  Unwitnessed fall. EXAM: CT HEAD WITHOUT CONTRAST CT CERVICAL SPINE WITHOUT CONTRAST TECHNIQUE: Multidetector CT imaging of the head and cervical spine was performed following the standard protocol without intravenous contrast. Multiplanar CT  image reconstructions of the cervical spine were also generated. COMPARISON:  September 28, 2015. FINDINGS: CT HEAD FINDINGS Brain: Mild diffuse cortical atrophy is noted. No mass effect or midline shift is noted. Ventricular size is within normal limits. There is no evidence of mass lesion, hemorrhage or acute infarction. Vascular: No hyperdense vessel or  unexpected calcification. Skull: Normal. Negative for fracture or focal lesion. Sinuses/Orbits: No acute finding. Other: None. CT CERVICAL SPINE FINDINGS Alignment: Normal. Skull base and vertebrae: Status post corpectomy of C4 and C5. Status post surgical anterior fusion extending from C3 to C7 with surgical screws present at these levels. No acute fracture is noted. Soft tissues and spinal canal: No prevertebral fluid or swelling. No visible canal hematoma. Disc levels: Postsurgical changes as described above. Fusion of C6-7 disc space is noted most likely due to surgery. Upper chest: Negative. Other: Degenerative changes are seen involving several posterior facet joints on the left. IMPRESSION: 1. Mild diffuse cortical atrophy. No acute intracranial abnormality seen. 2. Postsurgical and degenerative changes as described above. No acute abnormality seen in the cervical spine. Electronically Signed   By: Marijo Conception M.D.   On: 12/06/2019 09:36   VAS [place] LOWER EXTREMITY VENOUS (DVT) (ONLY [facility] & [facility])  Result Date: 12/06/2019  Lower Venous DVTStudy Indications: Edema.  Limitations: Patient movement. Comparison Study: no prior Performing Technologist: Abram Sander RVS  Examination Guidelines: A complete evaluation includes B-mode imaging, spectral Doppler, color Doppler, and power Doppler as needed of all accessible portions of each vessel. Bilateral testing is considered an integral part of a complete examination. Limited examinations for reoccurring indications may be performed as noted. The reflux portion of the exam is performed with the patient in reverse  Trendelenburg.  +---------+---------------+---------+-----------+----------+--------------+ RIGHT    CompressibilityPhasicitySpontaneityPropertiesThrombus Aging +---------+---------------+---------+-----------+----------+--------------+ CFV      Full           Yes      Yes                                 +---------+---------------+---------+-----------+----------+--------------+ SFJ      Full                                                        +---------+---------------+---------+-----------+----------+--------------+ FV Prox  Full                                                        +---------+---------------+---------+-----------+----------+--------------+ FV Mid                  Yes      Yes                                 +---------+---------------+---------+-----------+----------+--------------+ FV Distal               Yes      Yes                                 +---------+---------------+---------+-----------+----------+--------------+ PFV      Full                                                        +---------+---------------+---------+-----------+----------+--------------+  POP      Full           Yes      Yes                                 +---------+---------------+---------+-----------+----------+--------------+ PTV      Full                                                        +---------+---------------+---------+-----------+----------+--------------+ PERO     Full                                                        +---------+---------------+---------+-----------+----------+--------------+   +----+---------------+---------+-----------+----------+--------------+ LEFTCompressibilityPhasicitySpontaneityPropertiesThrombus Aging +----+---------------+---------+-----------+----------+--------------+ CFV                                              Not visualized  +----+---------------+---------+-----------+----------+--------------+     Summary: RIGHT: - There is no evidence of deep vein thrombosis in the lower extremity.  - No cystic structure found in the popliteal fossa.   *See table(s) above for measurements and observations.    Preliminary     Procedures Procedures (including critical care time)  Medications Ordered in ED Medications - No data to display  ED Course  I have reviewed the triage vital signs and the nursing notes.  Pertinent labs & imaging results that were available during my care of the patient were reviewed by me and considered in my medical decision making (see chart for details).    MDM Rules/Calculators/A&P                     Patient here for evaluation of injuries following an unwitnessed fall. She does not appear to have any discrete bony tenderness on evaluation. Imaging is negative for fracture. She does have mild leukocytosis on CBC, no clear source of infection at this time. Chest x-ray does demonstrate mild pulmonary vascular congestion but patient is asymptomatic at this time, clear lungs and no respiratory distress. Feel pulmonary vascular congestion can be followed up as an outpatient, presentation is not consistent with acute decompensated  CHF. UA is not consistent with UTI but will send cultures. Patient is able to ambulate in the department without any difficulty or reports of knee pain. Plan to discharge back to facility with outpatient follow-up and return precautions.  Findings of studies discussed with patient's son-in-law and daughter. Final Clinical Impression(s) / ED Diagnoses Final diagnoses:  Fall, initial encounter  Contusion of other part of head, initial encounter    Rx / DC Orders ED Discharge Orders    None       Quintella Reichert, MD 12/06/19 1146

## 2019-12-06 NOTE — ED Notes (Signed)
Pt ambulated with assistance.  

## 2019-12-06 NOTE — Progress Notes (Signed)
Lower extremity has been completed.   Preliminary results in CV Proc.   Abram Sander 12/06/2019 10:06 AM

## 2019-12-06 NOTE — Discharge Instructions (Signed)
Sandy Merritt had a chest x ray performed in the Emergency Department today that showed mild pulmonary vascular congestion but appears to have no symptoms at this time. Please have her follow up with her family doctor for recheck.  Please have her rechecked immediately if she develops difficulty breathing.

## 2019-12-06 NOTE — ED Notes (Signed)
Pure wick has been placed. Suction set to 45mmHg.  

## 2019-12-06 NOTE — ED Triage Notes (Signed)
Arrives via EMS from [facility], unwitnessed fall, unknown from what, EMS placed C-collar, bruising on upper forehead, no other injuries noted.

## 2019-12-06 NOTE — ED Notes (Signed)
Patient placed on bed alarm. Given breakfast tray per MD.

## 2019-12-07 LAB — URINE CULTURE: Culture: NO GROWTH

## 2019-12-22 ENCOUNTER — Emergency Department (HOSPITAL_COMMUNITY): Payer: Medicare PPO

## 2019-12-22 ENCOUNTER — Emergency Department (HOSPITAL_COMMUNITY)
Admission: EM | Admit: 2019-12-22 | Discharge: 2019-12-22 | Disposition: A | Discharge status: Home / Self Care | Payer: Medicare PPO | Attending: Emergency Medicine | Admitting: Emergency Medicine

## 2019-12-22 ENCOUNTER — Other Ambulatory Visit: Payer: Self-pay

## 2019-12-22 ENCOUNTER — Encounter (HOSPITAL_COMMUNITY): Payer: Self-pay

## 2019-12-22 DIAGNOSIS — M25552 Pain in left hip: Secondary | ICD-10-CM | POA: Insufficient documentation

## 2019-12-22 DIAGNOSIS — M069 Rheumatoid arthritis, unspecified: Secondary | ICD-10-CM | POA: Diagnosis not present

## 2019-12-22 DIAGNOSIS — Z79899 Other long term (current) drug therapy: Secondary | ICD-10-CM | POA: Diagnosis not present

## 2019-12-22 DIAGNOSIS — Y92191 Dining room in other specified residential institution as the place of occurrence of the external cause: Secondary | ICD-10-CM | POA: Insufficient documentation

## 2019-12-22 DIAGNOSIS — Y939 Activity, unspecified: Secondary | ICD-10-CM | POA: Insufficient documentation

## 2019-12-22 DIAGNOSIS — M25561 Pain in right knee: Secondary | ICD-10-CM | POA: Diagnosis not present

## 2019-12-22 DIAGNOSIS — Z85828 Personal history of other malignant neoplasm of skin: Secondary | ICD-10-CM | POA: Insufficient documentation

## 2019-12-22 DIAGNOSIS — W1830XA Fall on same level, unspecified, initial encounter: Secondary | ICD-10-CM | POA: Diagnosis not present

## 2019-12-22 DIAGNOSIS — M3210 Systemic lupus erythematosus, organ or system involvement unspecified: Secondary | ICD-10-CM | POA: Diagnosis not present

## 2019-12-22 DIAGNOSIS — Y999 Unspecified external cause status: Secondary | ICD-10-CM | POA: Insufficient documentation

## 2019-12-22 DIAGNOSIS — R519 Headache, unspecified: Secondary | ICD-10-CM | POA: Diagnosis not present

## 2019-12-22 DIAGNOSIS — W19XXXA Unspecified fall, initial encounter: Secondary | ICD-10-CM

## 2019-12-22 NOTE — ED Provider Notes (Signed)
[facility] Provider Note   CSN: [id number] Arrival date & time: 12/22/19  4174     History Chief Complaint  Patient presents with  . Fall    [facility] is a 86 y.o. female.  Patient with history of dementia.  Witnessed fall about an hour ago.  Hit the back of her head.  No loss consciousness.  Not on blood thinner.  Patient denies any pain but does have pain in the right hip and left knee when I push on them.  She was ambulatory after the fall.  The history is provided by the patient.  Fall This is a new problem. The current episode started less than 1 hour ago. The problem occurs constantly. The problem has not changed since onset.Associated symptoms include headaches. Pertinent negatives include no chest pain, no abdominal pain and no shortness of breath. Nothing aggravates the symptoms. Nothing relieves the symptoms. She has tried nothing for the symptoms. The treatment provided no relief.       Past Medical History:  Diagnosis Date  . Arthritis    rheumatoid  . Chicken pox   . GERD (gastroesophageal reflux disease)   . Glaucoma   . Headaches, cluster   . Lupus ([place])   . Urine incontinence     Patient Active Problem List   Diagnosis Date Noted  . Vaginal itching 05/03/2016  . Vision changes 03/16/2016  . Vaginal irritation 03/01/2016  . Fatigue 09/29/2015  . Degenerative arthritis of cervical spine 08/04/2015  . Urinary frequency 05/24/2015  . Toenail deformity 03/03/2015  . Basal cell carcinoma of skin of breast 03/03/2015  . Insomnia 07/31/2014  . Unspecified hereditary and idiopathic peripheral neuropathy 04/04/2014  . Benign paroxysmal positional vertigo 03/20/2014  . Vertigo 03/01/2014  . Lower abdominal pain 03/01/2014  . Anemia, unspecified 01/23/2014  . Lupus ([place]) 05/16/2011  . ASTHMA 08/11/2010  . HYPERLIPIDEMIA 08/10/2010  . ESOPHAGEAL STRICTURE 06/25/2008  . GERD 06/25/2008  . ANXIETY, CHRONIC, HX OF  06/25/2008  . MITRAL VALVE PROLAPSE, HX OF 06/25/2008  . Essential hypertension 01/30/2008  . DIVERTICULOSIS, COLON 01/30/2008  . OSTEOPENIA 01/30/2008    Past Surgical History:  Procedure Laterality Date  . ABDOMINAL HYSTERECTOMY    . APPENDECTOMY  1970  . CERVICAL SPINE SURGERY     ant. neck/done x2/ screws out now  . CESAREAN SECTION     2  . CHOLECYSTECTOMY  1970  . FOOT SURGERY  years ago   "toes"/"heart stopped during surgery" per pt.only(not medical staff)  . TONSILLECTOMY AND ADENOIDECTOMY  1955  . VAGINAL DELIVERY     1     OB History   No obstetric history on file.     Family History  Problem Relation Age of Onset  . Arthritis Mother   . Diabetes Mother   . Heart disease Mother   . Arthritis Father   . Heart disease Father   . Heart attack Father   . Diabetes Grandchild   . Drug abuse Maternal Aunt   . Colon cancer Neg Hx     Social History   Tobacco Use  . Smoking status: Never Smoker  . Smokeless tobacco: Never Used  Substance Use Topics  . Alcohol use: No  . Drug use: No    Home Medications Prior to Admission medications   Medication Sig Start Date End Date Taking? Authorizing Provider  acetaminophen (TYLENOL) 325 MG tablet Take 650 mg by mouth every 8 (eight) hours as  needed for mild pain or headache.   Yes [provider]  alendronate (FOSAMAX) 70 MG tablet Take 70 mg by mouth once a week. Take with a full glass of water on an empty stomach.   Yes [provider]  b complex vitamins capsule Take 1 capsule by mouth daily.   Yes [provider]  Cholecalciferol (VITAMIN D3) 25 MCG (1000 UT) CAPS Take 1,000 Units by mouth daily.   Yes [provider]  donepezil (ARICEPT) 10 MG tablet Take 10 mg by mouth at bedtime.   Yes [provider]  famotidine (PEPCID) 20 MG tablet Take 20 mg by mouth at bedtime.   Yes [provider]  melatonin 5 MG TABS Take 5 mg by mouth at bedtime.   Yes [provider]  risperiDONE (RISPERDAL) 0.25 MG tablet Take 0.125 mg by mouth every 6 (six) hours as needed (agitation, agression).   Yes [provider]  risperiDONE (RISPERDAL) 0.5 MG tablet Take 0.5 mg by mouth 2 (two) times daily.   Yes [provider]  sertraline (ZOLOFT) 25 MG tablet Take 25 mg by mouth daily.   Yes [provider]  esomeprazole (NEXIUM) 40 MG capsule TAKE ONE CAPSULE BY MOUTH DAILY BEFORE BREAKFAST Patient not taking: Reported on 12/06/2019 07/31/14   [name], MD  estrogens, conjugated, (PREMARIN) 0.3 MG tablet Take 1 tablet (0.3 mg total) by mouth daily. Patient not taking: Reported on 12/06/2019 03/08/16   [name], FNP  mirabegron ER (MYRBETRIQ) 25 MG TB24 tablet Take 1 tablet (25 mg total) by mouth daily. Patient not taking: Reported on 12/06/2019 03/23/16   [name], MD  pregabalin (LYRICA) 150 MG capsule Take 1 capsule (150 mg total) by mouth 2 (two) times daily. Patient not taking: Reported on 12/06/2019 03/06/16   [name], FNP  traZODone (DESYREL) 50 MG tablet TAKE 1 TABLET  BY MOUTH AT BEDTIME AS NEEDED FOR SLEEP. Patient not taking: Reported on 12/06/2019 03/06/16   [name], FNP    Allergies    Codeine, Propoxyphene, Propoxyphene hcl, and Propoxyphene n-acetaminophen  Review of Systems   Review of Systems  Constitutional: Negative for chills and fever.  HENT: Negative for ear pain and sore throat.   Eyes: Negative for pain and visual disturbance.  Respiratory: Negative for cough and shortness of breath.   Cardiovascular: Negative for chest pain and palpitations.  Gastrointestinal: Negative for abdominal pain and vomiting.  Genitourinary: Negative for dysuria and hematuria.  Musculoskeletal: Positive for arthralgias (right knee pain, left hip pain). Negative for back pain.  Skin: Negative for color change and rash.  Neurological: Positive for headaches. Negative for seizures and  syncope.  All other systems reviewed and are negative.   Physical Exam Updated Vital Signs BP 128/71   Pulse 76   Temp (!) 97.4 F (36.3 C) (Temporal)   Resp 17   Ht 5' (1.524 m)   Wt 68 kg   SpO2 100%   BMI 29.28 kg/m   Physical Exam Vitals and nursing note reviewed.  Constitutional:      General: She is not in acute distress.    Appearance: She is well-developed.  HENT:     Head: Normocephalic and atraumatic.     Mouth/Throat:     Mouth: Mucous membranes are moist.  Eyes:     Extraocular Movements: Extraocular movements intact.     Conjunctiva/sclera: Conjunctivae normal.     Pupils: Pupils are equal, round,  and reactive to light.  Cardiovascular:     Rate and Rhythm: Normal rate and regular rhythm.     Pulses: Normal pulses.     Heart sounds: Normal heart sounds. No murmur.  Pulmonary:     Effort: Pulmonary effort is normal. No respiratory distress.     Breath sounds: Normal breath sounds.  Abdominal:     Palpations: Abdomen is soft.     Tenderness: There is no abdominal tenderness.  Musculoskeletal:        General: Tenderness (ttp to right hip and left knee) present.     Cervical back: Normal range of motion and neck supple. No tenderness.  Skin:    General: Skin is warm and dry.     Capillary Refill: Capillary refill takes less than 2 seconds.  Neurological:     General: No focal deficit present.     Mental Status: She is alert and oriented to person, place, and time.     Cranial Nerves: No cranial nerve deficit.     Sensory: No sensory deficit.     Motor: No weakness.     Comments: 5+ out of 5 strength throughout, normal sensation     ED Results / Procedures / Treatments   Labs (all labs ordered are listed, but only abnormal results are displayed) Labs Reviewed - No data to display  EKG None  Radiology DG Chest 2 View  Result Date: 12/22/2019 CLINICAL DATA:  Pt from [facility] for mechanical fall. Pt was trying to sit at breakfast table,  thought a chair was there when it was not and she fell backwards, pt did hit her head, no other injuries noted. Unknown LOC, pt does not take any blood thinners. Pt alert and oriented at baseline, hx of dementia. Pt ambulates at facility. EXAM: CHEST - 2 VIEW COMPARISON:  12/06/2019 FINDINGS: Cardiac silhouette is mildly enlarged. No mediastinal or hilar masses or evidence of adenopathy. Lungs are hyperexpanded, but clear. No pleural effusion or pneumothorax. Skeletal structures are demineralized but intact. IMPRESSION: No acute cardiopulmonary disease. Electronically Signed   By: Lajean Manes M.D.   On: 12/22/2019 10:17   DG Pelvis 1-2 Views  Result Date: 12/22/2019 CLINICAL DATA:  Pt from [facility] for mechanical fall. Pt was trying to sit at breakfast table, thought a chair was there when it was not and she fell backwards, pt did hit her head, no other injuries noted. Unknown LOC, pt does not take any blood thinners. Pt alert and oriented at baseline, hx of dementia. Pt ambulates at facility. EXAM: PELVIS - 1-2 VIEW COMPARISON:  None. FINDINGS: No fracture or bone lesion. Hip joints, SI joints and pubic symphysis are normally spaced and aligned. No significant degenerative/arthropathic change. Soft tissues are unremarkable. IMPRESSION: Negative. Electronically Signed   By: Lajean Manes M.D.   On: 12/22/2019 10:32   CT Head Wo Contrast  Result Date: 12/22/2019 CLINICAL DATA:  Fall EXAM: CT HEAD WITHOUT CONTRAST CT CERVICAL SPINE WITHOUT CONTRAST TECHNIQUE: Multidetector CT imaging of the head and cervical spine was performed following the standard protocol without intravenous contrast. Multiplanar CT image reconstructions of the cervical spine were also generated. COMPARISON:  12/06/2019 FINDINGS: CT HEAD FINDINGS Brain: No evidence of acute infarction, hemorrhage, hydrocephalus, extra-axial collection or mass lesion/mass effect. Periventricular and deep white matter hypodensity with moderate global  cerebral volume loss. Vascular: No hyperdense vessel or unexpected calcification. Skull: Normal. Negative for fracture or focal lesion. Sinuses/Orbits: No acute finding. Other: None. CT CERVICAL  SPINE FINDINGS Alignment: Normal. Skull base and vertebrae: No acute fracture. No primary bone lesion or focal pathologic process. Soft tissues and spinal canal: No prevertebral fluid or swelling. No visible canal hematoma. Disc levels: Anterior cervical discectomy and fusion of C3 through C7 with corpectomy and bridging spacer of C4 through C6. there are fractured vertebral body screws of C7. Incidental note of bilateral cervical ribs of C7. Upper chest: Negative. Other: None. IMPRESSION: 1. No acute intracranial pathology. Small-vessel white matter disease and moderate global cerebral volume loss. 2. No fracture or static subluxation of the cervical spine. 3. Anterior cervical discectomy and fusion of C3 through C7 with corpectomy and bridging spacer of C4 through C6. 4. Fractured vertebral body screws of C7, unchanged compared to prior examination. Electronically Signed   By: Eddie Candle M.D.   On: 12/22/2019 10:06   CT Cervical Spine Wo Contrast  Result Date: 12/22/2019 CLINICAL DATA:  Fall EXAM: CT HEAD WITHOUT CONTRAST CT CERVICAL SPINE WITHOUT CONTRAST TECHNIQUE: Multidetector CT imaging of the head and cervical spine was performed following the standard protocol without intravenous contrast. Multiplanar CT image reconstructions of the cervical spine were also generated. COMPARISON:  12/06/2019 FINDINGS: CT HEAD FINDINGS Brain: No evidence of acute infarction, hemorrhage, hydrocephalus, extra-axial collection or mass lesion/mass effect. Periventricular and deep white matter hypodensity with moderate global cerebral volume loss. Vascular: No hyperdense vessel or unexpected calcification. Skull: Normal. Negative for fracture or focal lesion. Sinuses/Orbits: No acute finding. Other: None. CT CERVICAL SPINE FINDINGS  Alignment: Normal. Skull base and vertebrae: No acute fracture. No primary bone lesion or focal pathologic process. Soft tissues and spinal canal: No prevertebral fluid or swelling. No visible canal hematoma. Disc levels: Anterior cervical discectomy and fusion of C3 through C7 with corpectomy and bridging spacer of C4 through C6. there are fractured vertebral body screws of C7. Incidental note of bilateral cervical ribs of C7. Upper chest: Negative. Other: None. IMPRESSION: 1. No acute intracranial pathology. Small-vessel white matter disease and moderate global cerebral volume loss. 2. No fracture or static subluxation of the cervical spine. 3. Anterior cervical discectomy and fusion of C3 through C7 with corpectomy and bridging spacer of C4 through C6. 4. Fractured vertebral body screws of C7, unchanged compared to prior examination. Electronically Signed   By: Eddie Candle M.D.   On: 12/22/2019 10:06   DG Knee Complete 4 Views Left  Result Date: 12/22/2019 CLINICAL DATA:  Pt from [facility] for mechanical fall. Pt was trying to sit at breakfast table, thought a chair was there when it was not and she fell backwards, pt did hit her head, no other injuries noted. Unknown LOC, pt does not take any blood thinners. Pt alert and oriented at baseline, hx of dementia. Pt ambulates at facility. EXAM: LEFT KNEE - COMPLETE 4+ VIEW COMPARISON:  None. FINDINGS: No fracture or bone lesion. Knee joint normally spaced and aligned. No degenerative/arthropathic changes. No joint effusion. Normal soft tissues. IMPRESSION: Negative. Electronically Signed   By: Lajean Manes M.D.   On: 12/22/2019 10:31    Procedures Procedures (including critical care time)  Medications Ordered in ED Medications - No data to display  ED Course  I have reviewed the triage vital signs and the nursing notes.  Pertinent labs & imaging results that were available during my care of the patient were reviewed by me and considered in my  medical decision making (see chart for details).    MDM Rules/Calculators/A&P  QUANETTA TRUSS is an 85 year old female with history of dementia who presents to the ED after fall.  Patient with normal vitals.  No fever.  Witnessed fall at nursing home.  Hit the back of her head.  She did not think that she lost consciousness.  She is not on blood thinners.  Has some right hip pain, left knee pain.  May be mild headache.  Neurologically she appears intact.  Per EMS facility states that she is at her baseline.  CT scan of the head and neck are unremarkable.  X-rays of the chest, pelvis, knee were unremarkable.  Discharged back to facility.    This chart was dictated using voice recognition software.  Despite best efforts to proofread,  errors can occur which can change the documentation meaning.    Final Clinical Impression(s) / ED Diagnoses Final diagnoses:  Fall, initial encounter    Rx / DC Orders ED Discharge Orders    None       Lennice Sites, DO 12/22/19 1035

## 2019-12-22 NOTE — ED Notes (Signed)
Pt returned from radiology.

## 2019-12-22 NOTE — Discharge Instructions (Addendum)
No injuries on imaging today. 

## 2019-12-22 NOTE — ED Notes (Signed)
Pt transported to CT ?

## 2019-12-22 NOTE — ED Triage Notes (Signed)
Pt from [facility] for mechanical fall. Pt was trying to sit at breakfast table, thought a chair was there when it was not and she fell backwards, pt did hit her head, no other injuries noted. Unknown LOC, pt does not take any blood thinners. Pt alert and oriented at baseline, hx of dementia. Pt ambulates at facility. VSS c collar in place.

## 2020-01-04 ENCOUNTER — Emergency Department (HOSPITAL_COMMUNITY): Payer: Medicare PPO

## 2020-01-04 ENCOUNTER — Encounter (HOSPITAL_COMMUNITY): Payer: Self-pay | Admitting: Emergency Medicine

## 2020-01-04 ENCOUNTER — Emergency Department (HOSPITAL_COMMUNITY)
Admission: EM | Admit: 2020-01-04 | Discharge: 2020-01-04 | Disposition: A | Discharge status: Home / Self Care | Payer: Medicare PPO | Attending: Emergency Medicine | Admitting: Emergency Medicine

## 2020-01-04 DIAGNOSIS — Z79899 Other long term (current) drug therapy: Secondary | ICD-10-CM | POA: Insufficient documentation

## 2020-01-04 DIAGNOSIS — J45909 Unspecified asthma, uncomplicated: Secondary | ICD-10-CM | POA: Insufficient documentation

## 2020-01-04 DIAGNOSIS — F0390 Unspecified dementia without behavioral disturbance: Secondary | ICD-10-CM | POA: Diagnosis not present

## 2020-01-04 DIAGNOSIS — I10 Essential (primary) hypertension: Secondary | ICD-10-CM | POA: Diagnosis not present

## 2020-01-04 DIAGNOSIS — W19XXXA Unspecified fall, initial encounter: Secondary | ICD-10-CM

## 2020-01-04 DIAGNOSIS — R2243 Localized swelling, mass and lump, lower limb, bilateral: Secondary | ICD-10-CM | POA: Diagnosis not present

## 2020-01-04 MED ORDER — ACETAMINOPHEN 325 MG PO TABS
650.0000 mg | ORAL_TABLET | Freq: Once | ORAL | Status: DC
  Filled 2020-01-04: qty 2

## 2020-01-04 NOTE — ED Provider Notes (Signed)
[facility] DEPT Provider Note   CSN: [id number] Arrival date & time: 01/04/20  1654  LEVEL 5 CAVEAT - DEMENTIA  History Chief Complaint  Patient presents with  . Fall    Sandy Merritt is a 86 y.o. female.  HPI 86 year old female presents from nursing facility after a possible fall.  The patient was walking around in the facility complaining of pain when walking.  When asked her what happened she states she fell yesterday.  She is unable to further elaborate what happened.  She complains of pain "everywhere".  EMS reports stable vitals.  No witnessed fall at the facility. She has a history of dementia. When specifically asked, she endorses foot pain. She can't tell me which foot is in pain.   Past Medical History:  Diagnosis Date  . Arthritis    rheumatoid  . Chicken pox   . GERD (gastroesophageal reflux disease)   . Glaucoma   . Headaches, cluster   . Lupus ([place])   . Urine incontinence     Patient Active Problem List   Diagnosis Date Noted  . Vaginal itching 05/03/2016  . Vision changes 03/16/2016  . Vaginal irritation 03/01/2016  . Fatigue 09/29/2015  . Degenerative arthritis of cervical spine 08/04/2015  . Urinary frequency 05/24/2015  . Toenail deformity 03/03/2015  . Basal cell carcinoma of skin of breast 03/03/2015  . Insomnia 07/31/2014  . Unspecified hereditary and idiopathic peripheral neuropathy 04/04/2014  . Benign paroxysmal positional vertigo 03/20/2014  . Vertigo 03/01/2014  . Lower abdominal pain 03/01/2014  . Anemia, unspecified 01/23/2014  . Lupus ([place]) 05/16/2011  . ASTHMA 08/11/2010  . HYPERLIPIDEMIA 08/10/2010  . ESOPHAGEAL STRICTURE 06/25/2008  . GERD 06/25/2008  . ANXIETY, CHRONIC, HX OF 06/25/2008  . MITRAL VALVE PROLAPSE, HX OF 06/25/2008  . Essential hypertension 01/30/2008  . DIVERTICULOSIS, COLON 01/30/2008  . OSTEOPENIA 01/30/2008    Past Surgical History:  Procedure Laterality Date  . ABDOMINAL  HYSTERECTOMY    . APPENDECTOMY  1970  . CERVICAL SPINE SURGERY     ant. neck/done x2/ screws out now  . CESAREAN SECTION     2  . CHOLECYSTECTOMY  1970  . FOOT SURGERY  years ago   "toes"/"heart stopped during surgery" per pt.only(not medical staff)  . TONSILLECTOMY AND ADENOIDECTOMY  1955  . VAGINAL DELIVERY     1     OB History   No obstetric history on file.     Family History  Problem Relation Age of Onset  . Arthritis Mother   . Diabetes Mother   . Heart disease Mother   . Arthritis Father   . Heart disease Father   . Heart attack Father   . Diabetes Grandchild   . Drug abuse Maternal Aunt   . Colon cancer Neg Hx     Social History   Tobacco Use  . Smoking status: Never Smoker  . Smokeless tobacco: Never Used  Substance Use Topics  . Alcohol use: No  . Drug use: No    Home Medications Prior to Admission medications   Medication Sig Start Date End Date Taking? Authorizing Provider  acetaminophen (TYLENOL) 325 MG tablet Take 650 mg by mouth every 8 (eight) hours as needed for mild pain or headache.   Yes [provider]  alendronate (FOSAMAX) 70 MG tablet Take 70 mg by mouth once a week. Take with a full glass of water on an empty stomach.   Yes [provider]  b complex vitamins capsule Take 1 capsule by mouth daily.   Yes [provider]  buPROPion (WELLBUTRIN SR) 100 MG 12 hr tablet Take 100 mg by mouth daily.   Yes [provider]  Cholecalciferol (VITAMIN D3) 25 MCG (1000 UT) CAPS Take 1,000 Units by mouth daily.   Yes [provider]  donepezil (ARICEPT) 10 MG tablet Take 10 mg by mouth daily.    Yes [provider]  famotidine (PEPCID) 20 MG tablet Take 20 mg by mouth at bedtime.   Yes [provider]  melatonin 5 MG TABS Take 5 mg by mouth at bedtime.   Yes [provider]  mirtazapine (REMERON) 7.5 MG tablet Take 7.5 mg by mouth at bedtime.   Yes [provider]    risperiDONE (RISPERDAL) 0.5 MG tablet Take 0.5 mg by mouth 2 (two) times daily.   Yes [provider]  sertraline (ZOLOFT) 25 MG tablet Take 25 mg by mouth daily.   Yes [provider]  esomeprazole (NEXIUM) 40 MG capsule TAKE ONE CAPSULE BY MOUTH DAILY BEFORE BREAKFAST Patient not taking: Reported on 12/06/2019 07/31/14   [name], MD  estrogens, conjugated, (PREMARIN) 0.3 MG tablet Take 1 tablet (0.3 mg total) by mouth daily. Patient not taking: Reported on 12/06/2019 03/08/16   [name], FNP  mirabegron ER (MYRBETRIQ) 25 MG TB24 tablet Take 1 tablet (25 mg total) by mouth daily. Patient not taking: Reported on 12/06/2019 03/23/16   [name], MD  pregabalin (LYRICA) 150 MG capsule Take 1 capsule (150 mg total) by mouth 2 (two) times daily. Patient not taking: Reported on 12/06/2019 03/06/16   [name], FNP  risperiDONE (RISPERDAL) 0.25 MG tablet Take 0.125 mg by mouth every 6 (six) hours as needed (agitation, agression).    [provider]  traZODone (DESYREL) 50 MG tablet TAKE 1 TABLET  BY MOUTH AT BEDTIME AS NEEDED FOR SLEEP. Patient not taking: Reported on 12/06/2019 03/06/16   [name], FNP    Allergies    Codeine, Propoxyphene, Propoxyphene hcl, and Propoxyphene n-acetaminophen  Review of Systems   Review of Systems  Unable to perform ROS: Dementia    Physical Exam Updated Vital Signs BP (!) 123/56 (BP Location: Left Arm)   Pulse 83   Temp 98.4 F (36.9 C) (Oral)   Resp 19   SpO2 97%   Physical Exam Vitals and nursing note reviewed.  Constitutional:      General: She is not in acute distress.    Appearance: She is well-developed. She is not ill-appearing.  HENT:     Head: Normocephalic and atraumatic.     Comments: No obvious head injury    Right Ear: External ear normal.     Left Ear: External ear normal.     Nose: Nose normal.  Eyes:     General:        Right eye: No discharge.        Left eye:  No discharge.  Cardiovascular:     Rate and Rhythm: Normal rate and regular rhythm.     Pulses:          Radial pulses are 2+ on the right side and 2+ on the left side.     Heart sounds: Normal heart sounds.  Pulmonary:     Effort: Pulmonary effort is normal.     Breath sounds: Normal breath sounds.  Abdominal:     General: There is no distension.  Palpations: Abdomen is soft.     Tenderness: There is no abdominal tenderness.  Musculoskeletal:     Thoracic back: Tenderness present.       Back:     Right hip: No tenderness. Normal range of motion.     Left hip: No tenderness. Normal range of motion.     Right upper leg: No swelling or tenderness.     Left upper leg: No swelling or tenderness.     Comments: Bilateral feet and lower leg swelling. No focal tenderness on exam to feet, ankles, lower legs, knees No obvious upper extremity trauma or pain with palpation  Skin:    General: Skin is warm and dry.  Neurological:     Mental Status: She is alert.  Psychiatric:        Mood and Affect: Mood is not anxious.     ED Results / Procedures / Treatments   Labs (all labs ordered are listed, but only abnormal results are displayed) Labs Reviewed - No data to display  EKG None  Radiology DG Chest 1 View  Result Date: 01/04/2020 CLINICAL DATA:  Status post fall. EXAM: CHEST  1 VIEW COMPARISON:  December 22, 2019 FINDINGS: Mild, diffuse chronic appearing increased lung markings are seen without evidence of acute infiltrate, pleural effusion or pneumothorax. The heart size and mediastinal contours are within normal limits. A radiopaque fusion plate and screws are again seen overlying the lower cervical spine. The visualized skeletal structures are otherwise unremarkable. IMPRESSION: No active disease. Electronically Signed   By: Virgina Norfolk M.D.   On: 01/04/2020 18:16   DG Thoracic Spine W/Swimmers  Result Date: 01/04/2020 CLINICAL DATA:  Diffuse pain after fall EXAM: THORACIC  SPINE - 3 VIEWS COMPARISON:  12/22/2019 FINDINGS: Partially visualized postsurgical changes within the cervical spine. There is no evidence of thoracic spine fracture. No static listhesis. Mildly exaggerated thoracic kyphosis pedicles appear intact. Mild intervertebral disc height loss within the midthoracic spine. No other significant bone abnormalities are identified. IMPRESSION: No acute fracture or static listhesis of the thoracic spine. Electronically Signed   By: Davina Poke D.O.   On: 01/04/2020 18:00   DG Lumbar Spine Complete  Result Date: 01/04/2020 CLINICAL DATA:  Fall back pain. EXAM: LUMBAR SPINE - COMPLETE 4+ VIEW COMPARISON:  MR lumbar spine dated 11/15/2008 FINDINGS: There is no evidence of lumbar spine fracture. Lumbar dextroscoliosis with apex at L3 is similar to prior exam given differences in technique. Evaluation of anterolisthesis or retrolisthesis in the lower lumbar spine is limited. Moderate to severe degenerative disc and joint disease is seen in the lower lumbar spine. IMPRESSION: No acute findings, however evaluation of the lower lumbar spine is limited given lumbar dextroscoliosis with apex at L3 as well as degenerative disc and joint disease in the lower lumbar spine. Electronically Signed   By: Zerita Boers M.D.   On: 01/04/2020 18:11   DG Pelvis 1-2 Views  Result Date: 01/04/2020 CLINICAL DATA:  Status post fall. EXAM: PELVIS - 1-2 VIEW COMPARISON:  December 22, 2019 FINDINGS: There is no evidence of pelvic fracture or diastasis. No pelvic bone lesions are seen. Mild to moderate severity degenerative changes seen involving both hips and the visualized portion of the lower lumbar spine. IMPRESSION: No acute findings. Electronically Signed   By: Virgina Norfolk M.D.   On: 01/04/2020 18:17   DG Tibia/Fibula Left  Result Date: 01/04/2020 CLINICAL DATA:  86 year old female with fall and trauma to the left lower extremity. EXAM:  LEFT TIBIA AND FIBULA - 2 VIEW COMPARISON:   Right lower extremity radiograph dated 01/04/2020. FINDINGS: There is no acute fracture or dislocation. The bones are osteopenic. The ankle mortise is intact. The soft tissues are grossly unremarkable. IMPRESSION: No acute fracture or dislocation. Electronically Signed   By: Anner Crete M.D.   On: 01/04/2020 18:00   DG Tibia/Fibula Right  Result Date: 01/04/2020 CLINICAL DATA:  Fall with pain. EXAM: RIGHT TIBIA AND FIBULA - 2 VIEW COMPARISON:  Foot radiographs dated 03/04/2015. FINDINGS: There is no evidence of fracture or other focal bone lesions. There is diffuse osseous demineralization. Soft tissues are unremarkable. IMPRESSION: Negative. Electronically Signed   By: Zerita Boers M.D.   On: 01/04/2020 18:00   CT Head Wo Contrast  Result Date: 01/04/2020 CLINICAL DATA:  Fall yesterday. EXAM: CT HEAD WITHOUT CONTRAST CT CERVICAL SPINE WITHOUT CONTRAST TECHNIQUE: Multidetector CT imaging of the head and cervical spine was performed following the standard protocol without intravenous contrast. Multiplanar CT image reconstructions of the cervical spine were also generated. COMPARISON:  12/22/2019 FINDINGS: CT HEAD FINDINGS Brain: Ventricles and cisterns as well as CSF spaces are unchanged compatible with age related atrophy. There is no mass, mass effect, shift of midline structures or acute hemorrhage. No evidence of acute infarction. Small old lacunar infarct over the left cerebellum. Vascular: No hyperdense vessel or unexpected calcification. Skull: Normal. Negative for fracture or focal lesion. Sinuses/Orbits: No acute finding. Other: None. CT CERVICAL SPINE FINDINGS Alignment: No traumatic subluxation. Skull base and vertebrae: Examination demonstrates anterior fusion hardware with associated interbody fusion from C3-C7 intact and unchanged. Note that there is fracture of both screws at the C7 level unchanged. Mild spondylosis of the cervical spine. There is uncovertebral joint spurring and facet  arthropathy. There is significant bilateral neural foraminal narrowing at multiple levels due to adjacent bony spurring. No evidence of acute fracture. Atlantoaxial articulation is unchanged. Soft tissues and spinal canal: No prevertebral fluid or swelling. No visible canal hematoma. Disc levels:  Unchanged. Upper chest: No acute findings. Other: None. IMPRESSION: 1.  No acute brain injury. 2. Chronic ischemic microvascular disease and moderate age related atrophic change. 3.  No acute cervical spine injury. 4. Mild spondylosis of the cervical spine with multilevel bilateral neural foraminal narrowing unchanged. Anterior fusion hardware with interbody fusion from C3-C7 unchanged including fracture of both C7 screws unchanged. Electronically Signed   By: Marin Olp M.D.   On: 01/04/2020 17:40   CT Cervical Spine Wo Contrast  Result Date: 01/04/2020 CLINICAL DATA:  Fall yesterday. EXAM: CT HEAD WITHOUT CONTRAST CT CERVICAL SPINE WITHOUT CONTRAST TECHNIQUE: Multidetector CT imaging of the head and cervical spine was performed following the standard protocol without intravenous contrast. Multiplanar CT image reconstructions of the cervical spine were also generated. COMPARISON:  12/22/2019 FINDINGS: CT HEAD FINDINGS Brain: Ventricles and cisterns as well as CSF spaces are unchanged compatible with age related atrophy. There is no mass, mass effect, shift of midline structures or acute hemorrhage. No evidence of acute infarction. Small old lacunar infarct over the left cerebellum. Vascular: No hyperdense vessel or unexpected calcification. Skull: Normal. Negative for fracture or focal lesion. Sinuses/Orbits: No acute finding. Other: None. CT CERVICAL SPINE FINDINGS Alignment: No traumatic subluxation. Skull base and vertebrae: Examination demonstrates anterior fusion hardware with associated interbody fusion from C3-C7 intact and unchanged. Note that there is fracture of both screws at the C7 level unchanged. Mild  spondylosis of the cervical spine. There is uncovertebral joint spurring and  facet arthropathy. There is significant bilateral neural foraminal narrowing at multiple levels due to adjacent bony spurring. No evidence of acute fracture. Atlantoaxial articulation is unchanged. Soft tissues and spinal canal: No prevertebral fluid or swelling. No visible canal hematoma. Disc levels:  Unchanged. Upper chest: No acute findings. Other: None. IMPRESSION: 1.  No acute brain injury. 2. Chronic ischemic microvascular disease and moderate age related atrophic change. 3.  No acute cervical spine injury. 4. Mild spondylosis of the cervical spine with multilevel bilateral neural foraminal narrowing unchanged. Anterior fusion hardware with interbody fusion from C3-C7 unchanged including fracture of both C7 screws unchanged. Electronically Signed   By: Marin Olp M.D.   On: 01/04/2020 17:40   DG Foot Complete Left  Result Date: 01/04/2020 CLINICAL DATA:  Fall with pain EXAM: LEFT FOOT - COMPLETE 3+ VIEW COMPARISON:  None. FINDINGS: There is no evidence of fracture or dislocation. Chronic appearing hallux valgus is noted with mild degenerative changes at the first metatarsal-phalangeal joint. A posterior calcaneal enthesophyte is seen. There is diffuse osseous demineralization. Soft tissues are unremarkable. IMPRESSION: No acute findings. Electronically Signed   By: Zerita Boers M.D.   On: 01/04/2020 18:03   DG Foot Complete Right  Result Date: 01/04/2020 CLINICAL DATA:  Foot pain after a fall EXAM: RIGHT FOOT COMPLETE - 3+ VIEW COMPARISON:  Foot radiographs dated 03/03/2015. FINDINGS: Hallux valgus is redemonstrated. Screws are seen in the first digit proximal phalanx and head of the second metatarsal. Degenerative changes of first metatarsophalangeal joint and third digit metatarsophalangeal joint. There is subluxation at the second metatarsophalangeal joint. There is unchanged deformity of the fifth metatarsal head. No  acute osseous injury. A small posterior calcaneal enthesophyte is noted. The soft tissues are unremarkable. IMPRESSION: 1. No acute osseous injury. Electronically Signed   By: Zerita Boers M.D.   On: 01/04/2020 18:06    Procedures Procedures (including critical care time)  Medications Ordered in ED Medications  acetaminophen (TYLENOL) tablet 650 mg (650 mg Oral Not Given 01/04/20 1754)    ED Course  I have reviewed the triage vital signs and the nursing notes.  Pertinent labs & imaging results that were available during my care of the patient were reviewed by me and considered in my medical decision making (see chart for details).    MDM Rules/Calculators/A&P                          Multiple x-rays were ordered given multiple complaints of pain though no obvious trauma noted on external exam.  I have reviewed these x-rays and there is no obvious fractures or dislocations.  CT head and C-spine also personally reviewed and is benign.  She is able to get up and walk and bear weight.  Highly doubt occult hip fracture.  I discussed the work-up and care with her daughter over the phone and at this point she appears stable for discharge home.  It is unclear if she truly even fell though she does have a history of multiple falls.  Discharge back to facility.  Vital signs are unremarkable. Final Clinical Impression(s) / ED Diagnoses Final diagnoses:  Fall, initial encounter    Rx / DC Orders ED Discharge Orders    None       Sherwood Gambler, MD 01/04/20 1859

## 2020-01-04 NOTE — ED Notes (Addendum)
Attempted to call report to [facility] without answer.

## 2020-01-04 NOTE — ED Notes (Signed)
[facility] called for transport.  

## 2020-01-04 NOTE — ED Notes (Signed)
Patient transported to X-ray 

## 2020-01-04 NOTE — ED Notes (Signed)
Patient ambulatory in room with two staff assist.

## 2020-01-04 NOTE — ED Triage Notes (Signed)
Per EMS, patient from [facility], patient reports to staff fall yesterday. C/o pain all over. Hx dementia. Sleeping upon EMS arrival. Ambulatory with EMS.

## 2020-01-09 ENCOUNTER — Emergency Department (HOSPITAL_COMMUNITY): Payer: Medicare PPO

## 2020-01-09 ENCOUNTER — Encounter (HOSPITAL_COMMUNITY): Payer: Self-pay | Admitting: Emergency Medicine

## 2020-01-09 ENCOUNTER — Emergency Department (HOSPITAL_COMMUNITY)
Admission: EM | Admit: 2020-01-09 | Discharge: 2020-01-09 | Disposition: A | Discharge status: Home / Self Care | Payer: Medicare PPO | Attending: Emergency Medicine | Admitting: Emergency Medicine

## 2020-01-09 ENCOUNTER — Other Ambulatory Visit: Payer: Self-pay

## 2020-01-09 DIAGNOSIS — F0390 Unspecified dementia without behavioral disturbance: Secondary | ICD-10-CM | POA: Insufficient documentation

## 2020-01-09 DIAGNOSIS — Y92001 Dining room of unspecified non-institutional (private) residence as the place of occurrence of the external cause: Secondary | ICD-10-CM | POA: Diagnosis not present

## 2020-01-09 DIAGNOSIS — Y939 Activity, unspecified: Secondary | ICD-10-CM | POA: Diagnosis not present

## 2020-01-09 DIAGNOSIS — I10 Essential (primary) hypertension: Secondary | ICD-10-CM | POA: Insufficient documentation

## 2020-01-09 DIAGNOSIS — S0191XA Laceration without foreign body of unspecified part of head, initial encounter: Secondary | ICD-10-CM | POA: Diagnosis present

## 2020-01-09 DIAGNOSIS — S0101XA Laceration without foreign body of scalp, initial encounter: Secondary | ICD-10-CM | POA: Insufficient documentation

## 2020-01-09 DIAGNOSIS — Y999 Unspecified external cause status: Secondary | ICD-10-CM | POA: Insufficient documentation

## 2020-01-09 DIAGNOSIS — J45909 Unspecified asthma, uncomplicated: Secondary | ICD-10-CM | POA: Insufficient documentation

## 2020-01-09 DIAGNOSIS — W19XXXA Unspecified fall, initial encounter: Secondary | ICD-10-CM | POA: Insufficient documentation

## 2020-01-09 DIAGNOSIS — Z23 Encounter for immunization: Secondary | ICD-10-CM | POA: Insufficient documentation

## 2020-01-09 MED ORDER — TETANUS-DIPHTH-ACELL PERTUSSIS 5-2.5-18.5 LF-MCG/0.5 IM SUSP
0.5000 mL | Freq: Once | INTRAMUSCULAR | Status: AC
  Administered 2020-01-09: 0.5 mL via INTRAMUSCULAR
  Filled 2020-01-09: qty 0.5

## 2020-01-09 NOTE — ED Triage Notes (Signed)
Arrives via EMS from [facility], C/C witnessed fall in dining room around 1710. Laceration to posterior head, bleeding noted, pt not on bloodthinners.

## 2020-01-09 NOTE — ED Provider Notes (Signed)
[facility] DEPT Provider Note   CSN: [id number] Arrival date & time: 01/09/20  1752     History Chief Complaint  Patient presents with  . Fall  . Head Laceration    Sandy Merritt is a 86 y.o. female brought in by EMS from [facility] for evaluation of witnessed fall that occurred this evening at about 5:10 PM.  Patient was supposedly in the dining hall when she fell, hitting her posterior head.  Patient is not on blood thinners. Patient with history of dementia.   EM LEVEL 5 CAVEAT DUE TO DEMENTIA  The history is provided by the EMS personnel.       Past Medical History:  Diagnosis Date  . Arthritis    rheumatoid  . Chicken pox   . GERD (gastroesophageal reflux disease)   . Glaucoma   . Headaches, cluster   . Lupus ([place])   . Urine incontinence     Patient Active Problem List   Diagnosis Date Noted  . Vaginal itching 05/03/2016  . Vision changes 03/16/2016  . Vaginal irritation 03/01/2016  . Fatigue 09/29/2015  . Degenerative arthritis of cervical spine 08/04/2015  . Urinary frequency 05/24/2015  . Toenail deformity 03/03/2015  . Basal cell carcinoma of skin of breast 03/03/2015  . Insomnia 07/31/2014  . Unspecified hereditary and idiopathic peripheral neuropathy 04/04/2014  . Benign paroxysmal positional vertigo 03/20/2014  . Vertigo 03/01/2014  . Lower abdominal pain 03/01/2014  . Anemia, unspecified 01/23/2014  . Lupus ([place]) 05/16/2011  . ASTHMA 08/11/2010  . HYPERLIPIDEMIA 08/10/2010  . ESOPHAGEAL STRICTURE 06/25/2008  . GERD 06/25/2008  . ANXIETY, CHRONIC, HX OF 06/25/2008  . MITRAL VALVE PROLAPSE, HX OF 06/25/2008  . Essential hypertension 01/30/2008  . DIVERTICULOSIS, COLON 01/30/2008  . OSTEOPENIA 01/30/2008    Past Surgical History:  Procedure Laterality Date  . ABDOMINAL HYSTERECTOMY    . APPENDECTOMY  1970  . CERVICAL SPINE SURGERY     ant. neck/done x2/ screws out now  . CESAREAN SECTION     2  .  CHOLECYSTECTOMY  1970  . FOOT SURGERY  years ago   "toes"/"heart stopped during surgery" per pt.only(not medical staff)  . TONSILLECTOMY AND ADENOIDECTOMY  1955  . VAGINAL DELIVERY     1     OB History   No obstetric history on file.     Family History  Problem Relation Age of Onset  . Arthritis Mother   . Diabetes Mother   . Heart disease Mother   . Arthritis Father   . Heart disease Father   . Heart attack Father   . Diabetes Grandchild   . Drug abuse Maternal Aunt   . Colon cancer Neg Hx     Social History   Tobacco Use  . Smoking status: Never Smoker  . Smokeless tobacco: Never Used  Substance Use Topics  . Alcohol use: No  . Drug use: No    Home Medications Prior to Admission medications   Medication Sig Start Date End Date Taking? Authorizing Provider  acetaminophen (TYLENOL) 325 MG tablet Take 650 mg by mouth every 8 (eight) hours as needed for mild pain or headache.    [provider]  alendronate (FOSAMAX) 70 MG tablet Take 70 mg by mouth once a week. Take with a full glass of water on an empty stomach.    [provider]  b complex vitamins capsule Take 1 capsule by mouth daily.    [provider]  buPROPion (WELLBUTRIN SR) 100 MG 12 hr tablet Take 100 mg by mouth daily.    [provider]  Cholecalciferol (VITAMIN D3) 25 MCG (1000 UT) CAPS Take 1,000 Units by mouth daily.    [provider]  donepezil (ARICEPT) 10 MG tablet Take 10 mg by mouth daily.     [provider]  esomeprazole (NEXIUM) 40 MG capsule TAKE ONE CAPSULE BY MOUTH DAILY BEFORE BREAKFAST Patient not taking: Reported on 12/06/2019 07/31/14   [name], MD  estrogens, conjugated, (PREMARIN) 0.3 MG tablet Take 1 tablet (0.3 mg total) by mouth daily. Patient not taking: Reported on 12/06/2019 03/08/16   [name], FNP  famotidine (PEPCID) 20 MG tablet Take 20 mg by mouth at bedtime.    [provider]  melatonin 5 MG  TABS Take 5 mg by mouth at bedtime.    [provider]  mirabegron ER (MYRBETRIQ) 25 MG TB24 tablet Take 1 tablet (25 mg total) by mouth daily. Patient not taking: Reported on 12/06/2019 03/23/16   [name], MD  mirtazapine (REMERON) 7.5 MG tablet Take 7.5 mg by mouth at bedtime.    [provider]  pregabalin (LYRICA) 150 MG capsule Take 1 capsule (150 mg total) by mouth 2 (two) times daily. Patient not taking: Reported on 12/06/2019 03/06/16   [name], FNP  risperiDONE (RISPERDAL) 0.25 MG tablet Take 0.125 mg by mouth every 6 (six) hours as needed (agitation, agression).    [provider]  risperiDONE (RISPERDAL) 0.5 MG tablet Take 0.5 mg by mouth 2 (two) times daily.    [provider]  sertraline (ZOLOFT) 25 MG tablet Take 25 mg by mouth daily.    [provider]  traZODone (DESYREL) 50 MG tablet TAKE 1 TABLET  BY MOUTH AT BEDTIME AS NEEDED FOR SLEEP. Patient not taking: Reported on 12/06/2019 03/06/16   [name], FNP    Allergies    Codeine, Propoxyphene, Propoxyphene hcl, and Propoxyphene n-acetaminophen  Review of Systems   Review of Systems  Unable to perform ROS: Dementia    Physical Exam Updated Vital Signs BP 125/63   Pulse 81   Temp 98.4 F (36.9 C) (Oral)   Resp 17   Ht 5' (1.524 m)   Wt 68 kg   SpO2 96%   BMI 29.28 kg/m   Physical Exam Vitals and nursing note reviewed.  Constitutional:      Appearance: Normal appearance. She is well-developed.  HENT:     Head: Normocephalic.      Comments: 1.5 cm laceration on posterior scalp.  No underlying skull deformity or crepitus noted. Eyes:     General: Lids are normal.     Conjunctiva/sclera: Conjunctivae normal.     Pupils: Pupils are equal, round, and reactive to light.     Comments: PERRL. EOMs intact. No nystagmus. No neglect.   Neck:     Comments: C-collar in place. Cardiovascular:     Rate and Rhythm: Normal rate and regular rhythm.      Pulses: Normal pulses.     Heart sounds: Normal heart sounds. No murmur heard.  No friction rub. No gallop.   Pulmonary:     Effort: Pulmonary effort is normal.     Breath sounds: Normal breath sounds.  Abdominal:     Palpations: Abdomen is soft. Abdomen is not rigid.     Tenderness: There is no abdominal tenderness. There is no guarding.     Comments: Abdomen  is soft, non-distended, non-tender. No rigidity, No guarding. No peritoneal signs.  Musculoskeletal:        General: Normal range of motion.     Cervical back: Full passive range of motion without pain.     Comments: No tenderness to palpation to bilateral knees and ankles. No deformities or crepitus noted.  Limited active range of motion but is able to passively range of motion her bilateral lower extremities. No pelvic instability. No tenderness to palpation to bilateral shoulders, clavicles, elbows, and wrists. No deformities or crepitus noted. FROM of BUE without difficulty.  No midline T or L-spine tenderness.  No deformity or crepitus noted.    Skin:    General: Skin is warm and dry.     Capillary Refill: Capillary refill takes less than 2 seconds.  Neurological:     Mental Status: She is alert.     Comments: Cranial nerves III-XII intact Follows some commands but has to be redirected.  Moves all extremities. 5/5 strength to BUE and BLE  Sensation intact throughout all major nerve distributions No slurred speech. No facial droop.  Alert and oriented x2.  Psychiatric:        Speech: Speech normal.     ED Results / Procedures / Treatments   Labs (all labs ordered are listed, but only abnormal results are displayed) Labs Reviewed - No data to display  EKG None  Radiology CT Head Wo Contrast  Result Date: 01/09/2020 CLINICAL DATA:  Headache after head trauma. Witnessed fall in the dining room. Laceration to posterior head. EXAM: CT HEAD WITHOUT CONTRAST TECHNIQUE: Contiguous axial images were obtained from the base  of the skull through the vertex without intravenous contrast. COMPARISON:  Head CT 5 days ago 01/04/2020 FINDINGS: Brain: No acute hemorrhage or subdural collection. Stable degree of atrophy and chronic small vessel ischemia. No evidence of acute ischemia or territorial infarct. No midline shift or mass effect. Vascular: No hyperdense vessel. Skull: No fracture or focal lesion. Sinuses/Orbits: No acute finding.  Bilateral cataract resection. Other: Posterior scalp laceration in the [place] but with minimal hematoma. IMPRESSION: 1. Posterior scalp laceration. No acute intracranial abnormality. No skull fracture. 2. Stable atrophy and chronic small vessel ischemia. Electronically Signed   By: Keith Rake M.D.   On: 01/09/2020 19:15   CT Cervical Spine Wo Contrast  Result Date: 01/09/2020 CLINICAL DATA:  Head trauma secondary to a fall. Laceration to posterior head. EXAM: CT CERVICAL SPINE WITHOUT CONTRAST TECHNIQUE: Multidetector CT imaging of the cervical spine was performed without intravenous contrast. Multiplanar CT image reconstructions were also generated. COMPARISON:  CT scan dated 01/04/2020 FINDINGS: Alignment: Lateral alignment is normal. Skull base and vertebrae: No acute abnormalities. Surgical fusion from C3-C7 with corpectomy at C4 and C5 with strut in place. Chronic fractures of the screws in the C7. 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 The lateral masses and posterior elements of C2 and C3 are fused. There are severe arthritic changes at the C1-2 articulations, worse on the left than the right. Chronic degenerative changes between the anterior arch of C1 and the odontoid process. Soft tissues and spinal canal: No prevertebral fluid or swelling. No visible canal hematoma. Disc levels: No discrete disc protrusions. The spine is fused from C2 through C7 as described above. Chronic arthritic changes at C1-2 as described above. Moderate right facet arthritis at C7-T1. Upper chest: Negative.  Other: None IMPRESSION: 1. No acute abnormality of the cervical spine. 2. Surgical fusion from C3-C7 with corpectomy  at C4 and C5 with strut in place. 3. Chronic fractures of the screws in the C7 vertebral body. 4. Severe arthritic changes at the C1-2 articulations, worse on the left than the right. 5. Moderate right facet arthritis at C7-T1. Electronically Signed   By: Lorriane Shire M.D.   On: 01/09/2020 19:21    Procedures .Marland KitchenLaceration Repair  Date/Time: 01/09/2020 7:41 PM Performed by: Volanda Napoleon, PA-C Authorized by: Volanda Napoleon, PA-C   Consent:    Consent obtained:  Verbal and emergent situation   Risks discussed:  Infection, need for additional repair, pain, poor cosmetic result and poor wound healing   Alternatives discussed:  No treatment and delayed treatment Universal protocol:    Procedure explained and questions answered to patient or proxy's satisfaction: yes     Relevant documents present and verified: yes     Test results available and properly labeled: yes     Imaging studies available: yes     Required blood products, implants, devices, and special equipment available: yes     Site/side marked: yes     Immediately prior to procedure, a time out was called: yes     Patient identity confirmed:  Verbally with patient Laceration details:    Location:  Scalp   Scalp location:  Occipital   Length (cm):  1.5 Repair type:    Repair type:  Simple Pre-procedure details:    Preparation:  Patient was prepped and draped in usual sterile fashion Exploration:    Wound exploration: wound explored through full range of motion   Treatment:    Area cleansed with:  Betadine   Amount of cleaning:  Extensive   Irrigation solution:  Sterile saline   Irrigation method:  Syringe   Visualized foreign bodies/material removed: no   Skin repair:    Repair method:  Staples   Number of staples:  4 Approximation:    Approximation:  Close Post-procedure details:    Dressing:   Antibiotic ointment   Patient tolerance of procedure:  Tolerated well, no immediate complications   (including critical care time)  Medications Ordered in ED Medications  Tdap (BOOSTRIX) injection 0.5 mL (0.5 mLs Intramuscular Given 01/09/20 1859)    ED Course  I have reviewed the triage vital signs and the nursing notes.  Pertinent labs & imaging results that were available during my care of the patient were reviewed by me and considered in my medical decision making (see chart for details).    MDM Rules/Calculators/A&P                          86 year old female brought in by EMS from [facility] for evaluation of fall.  Patient fell hitting the posterior aspect of her head.  Patient with history of dementia.  On initially arrival, she is afebrile, nontoxic-appearing.  Vital signs are stable.  She has evidence of laceration to the posterior aspect head.  C-collar in place.  Will plan for imaging of head and neck.  No other injury noted.  She does state that everything hurts whenever I palpate an area but when I talk to her and distract her and press on her arms or back, hips or legs she does not have any pain.  I attempted to call [facility] but was unable to get in contact with anybody.  CT head shows posterior scalp laceration. No evidence of skull fracture. No acute intracranial hemorrhage.  Laceration repaired as documented above.   Attempted to call [facility] but again received no answer.   Discussed with patient's daughter, Sandy Merritt.  Patient with known history of dementia and is often confused.  Patient is at baseline.  Updated her on treatment in the emergency department.  She is agreeable to plan.  At this time, patient exhibits no emergent life-threatening condition that require further evaluation in ED. Discussed patient with Dr. Darl Householder who is agreeable.  Portions of this note were generated with [employer].  Dictation errors may occur despite best attempts at proofreading.  Final Clinical Impression(s) / ED Diagnoses Final diagnoses:  Laceration of scalp, initial encounter  Fall, initial encounter    Rx / DC Orders ED Discharge Orders    None       Desma Mcgregor 01/09/20 2111    Drenda Freeze, MD 01/13/20 1504

## 2020-01-09 NOTE — ED Notes (Signed)
[facility] called for pt transfer back to facility. [facility] states they are currently extremely busy so it may be a while.

## 2020-01-09 NOTE — ED Notes (Signed)
Patty, RN, and myself cleaned as much blood as possible out of pt's hair using wound spray.

## 2020-01-09 NOTE — Discharge Instructions (Signed)
Keep the wound clean and dry for the first 24 hours. After that you may gently clean the wound with soap and water. Make sure to pat dry the wound before covering it with any dressing. You can use topical antibiotic ointment and bandage. Ice and elevate for pain relief.  ° °You can take Tylenol or Ibuprofen as directed for pain. You can alternate Tylenol and Ibuprofen every 4 hours for additional pain relief.  ° °Return to the Emergency Department, your primary care doctor, or the [facility] in 7-10 days for suture removal.  ° °Monitor closely for any signs of infection. Return to the Emergency Department for any worsening redness/swelling of the area that begins to spread, drainage from the site, worsening pain, fever or any other worsening or concerning symptoms.  ° ° °

## 2020-01-28 ENCOUNTER — Emergency Department (HOSPITAL_COMMUNITY): Payer: Medicare PPO

## 2020-01-28 ENCOUNTER — Emergency Department (HOSPITAL_COMMUNITY)
Admission: EM | Admit: 2020-01-28 | Discharge: 2020-01-28 | Disposition: A | Discharge status: Skilled Nursing Facility | Payer: Medicare PPO | Attending: Emergency Medicine | Admitting: Emergency Medicine

## 2020-01-28 ENCOUNTER — Other Ambulatory Visit: Payer: Self-pay

## 2020-01-28 DIAGNOSIS — S0101XA Laceration without foreign body of scalp, initial encounter: Secondary | ICD-10-CM | POA: Insufficient documentation

## 2020-01-28 DIAGNOSIS — W19XXXA Unspecified fall, initial encounter: Secondary | ICD-10-CM | POA: Diagnosis not present

## 2020-01-28 DIAGNOSIS — Y92129 Unspecified place in nursing home as the place of occurrence of the external cause: Secondary | ICD-10-CM | POA: Diagnosis not present

## 2020-01-28 DIAGNOSIS — J45909 Unspecified asthma, uncomplicated: Secondary | ICD-10-CM | POA: Insufficient documentation

## 2020-01-28 DIAGNOSIS — I10 Essential (primary) hypertension: Secondary | ICD-10-CM | POA: Insufficient documentation

## 2020-01-28 DIAGNOSIS — Y939 Activity, unspecified: Secondary | ICD-10-CM | POA: Insufficient documentation

## 2020-01-28 DIAGNOSIS — Y999 Unspecified external cause status: Secondary | ICD-10-CM | POA: Diagnosis not present

## 2020-01-28 DIAGNOSIS — Z853 Personal history of malignant neoplasm of breast: Secondary | ICD-10-CM | POA: Insufficient documentation

## 2020-01-28 DIAGNOSIS — E876 Hypokalemia: Secondary | ICD-10-CM | POA: Diagnosis not present

## 2020-01-28 DIAGNOSIS — L03115 Cellulitis of right lower limb: Secondary | ICD-10-CM | POA: Insufficient documentation

## 2020-01-28 LAB — COMPREHENSIVE METABOLIC PANEL
ALT: 25 U/L (ref 0–44)
AST: 24 U/L (ref 15–41)
Albumin: 3.5 g/dL (ref 3.5–5.0)
Alkaline Phosphatase: 71 U/L (ref 38–126)
Anion gap: 9 (ref 5–15)
BUN: 20 mg/dL (ref 8–23)
CO2: 25 mmol/L (ref 22–32)
Calcium: 8.6 mg/dL — ABNORMAL LOW (ref 8.9–10.3)
Chloride: 102 mmol/L (ref 98–111)
Creatinine, Ser: 0.63 mg/dL (ref 0.44–1.00)
GFR calc Af Amer: >60 mL/min (ref >60)
GFR calc non Af Amer: >60 mL/min (ref >60)
Glucose, Bld: 123 mg/dL — ABNORMAL HIGH (ref 70–99)
Potassium: 3.2 mmol/L — ABNORMAL LOW (ref 3.5–5.1)
Sodium: 136 mmol/L (ref 135–145)
Total Bilirubin: 0.8 mg/dL (ref 0.3–1.2)
Total Protein: 7.6 g/dL (ref 6.5–8.1)

## 2020-01-28 LAB — CBC WITH DIFFERENTIAL/PLATELET
Abs Immature Granulocytes: 0.03 10*3/uL (ref 0.00–0.07)
Basophils Absolute: 0.1 10*3/uL (ref 0.0–0.1)
Basophils Relative: 1 %
Eosinophils Absolute: 0.2 10*3/uL (ref 0.0–0.5)
Eosinophils Relative: 2 %
HCT: 35.7 % — ABNORMAL LOW (ref 36.0–46.0)
Hemoglobin: 11.4 g/dL — ABNORMAL LOW (ref 12.0–15.0)
Immature Granulocytes: 0 %
Lymphocytes Relative: 13 %
Lymphs Abs: 1.3 10*3/uL (ref 0.7–4.0)
MCH: 28.9 pg (ref 26.0–34.0)
MCHC: 31.9 g/dL (ref 30.0–36.0)
MCV: 90.4 fL (ref 80.0–100.0)
Monocytes Absolute: 0.7 10*3/uL (ref 0.1–1.0)
Monocytes Relative: 7 %
Neutro Abs: 7.4 10*3/uL (ref 1.7–7.7)
Neutrophils Relative %: 77 %
Platelets: 294 10*3/uL (ref 150–400)
RBC: 3.95 MIL/uL (ref 3.87–5.11)
RDW: 13.2 % (ref 11.5–15.5)
WBC: 9.6 10*3/uL (ref 4.0–10.5)
nRBC: 0 % (ref 0.0–0.2)

## 2020-01-28 MED ORDER — SULFAMETHOXAZOLE-TRIMETHOPRIM 800-160 MG PO TABS
1.0000 | ORAL_TABLET | Freq: Two times a day (BID) | ORAL | 0 refills | Status: DC
Start: 2020-01-28 — End: 2020-02-10

## 2020-01-28 MED ORDER — POTASSIUM CHLORIDE 20 MEQ/15ML (10%) PO SOLN
40.0000 meq | Freq: Once | ORAL | Status: AC
  Administered 2020-01-28: 40 meq via ORAL
  Filled 2020-01-28: qty 30

## 2020-01-28 MED ORDER — SULFAMETHOXAZOLE-TRIMETHOPRIM 800-160 MG PO TABS
1.0000 | ORAL_TABLET | Freq: Once | ORAL | Status: AC
  Administered 2020-01-28: 1 via ORAL
  Filled 2020-01-28: qty 1

## 2020-01-28 NOTE — Discharge Instructions (Addendum)
Patient has 2 staples on the back of her scalp.  They need to be removed in 7 days.  You can wash your hair to get the blood out of it and then try to keep it dry.  You can use triple antibiotic on the wound to help prevent infection.  She appears to have a cellulitis or skin infection of her right lower leg.  Give her the antibiotic until gone.  Have her recheck for any problems listed on the head injury sheet, if she gets fever, or if the redness in her leg starts spreading or you see a red streak.

## 2020-01-28 NOTE — ED Triage Notes (Signed)
Unwitnessed fall from [facility], complaining of hip pain, has history of hip fracture, has small laceration to back of head per facility. Alert to baseline upon arrival

## 2020-01-28 NOTE — ED Provider Notes (Signed)
[facility] DEPT Provider Note   CSN: [id number] Arrival date & time: 01/28/20  0059   Time seen 1:20 AM  History Chief Complaint  Patient presents with  . Fall   Level 5 caveat for dementia  Sandy Merritt is a 86 y.o. female.  HPI  When I talk to patient she answers inappropriately, her words are actual words but they do not relate to what I am talking about.  Per EMS patient had a unwitnessed fall at her facility and was complaining of hip pain.  She denies any pain to me.  They also reports she had a laceration to the back of her head.  She presents with a cervical collar in place.  She rambles on about things that we are not discussing.  Is hard to tell if she has any injuries.   PCP [name], FNP]   Past Medical History:  Diagnosis Date  . Arthritis    rheumatoid  . Chicken pox   . GERD (gastroesophageal reflux disease)   . Glaucoma   . Headaches, cluster   . Lupus ([place])   . Urine incontinence     Patient Active Problem List   Diagnosis Date Noted  . Vaginal itching 05/03/2016  . Vision changes 03/16/2016  . Vaginal irritation 03/01/2016  . Fatigue 09/29/2015  . Degenerative arthritis of cervical spine 08/04/2015  . Urinary frequency 05/24/2015  . Toenail deformity 03/03/2015  . Basal cell carcinoma of skin of breast 03/03/2015  . Insomnia 07/31/2014  . Unspecified hereditary and idiopathic peripheral neuropathy 04/04/2014  . Benign paroxysmal positional vertigo 03/20/2014  . Vertigo 03/01/2014  . Lower abdominal pain 03/01/2014  . Anemia, unspecified 01/23/2014  . Lupus ([place]) 05/16/2011  . ASTHMA 08/11/2010  . HYPERLIPIDEMIA 08/10/2010  . ESOPHAGEAL STRICTURE 06/25/2008  . GERD 06/25/2008  . ANXIETY, CHRONIC, HX OF 06/25/2008  . MITRAL VALVE PROLAPSE, HX OF 06/25/2008  . Essential hypertension 01/30/2008  . DIVERTICULOSIS, COLON 01/30/2008  . OSTEOPENIA 01/30/2008    Past Surgical History:  Procedure  Laterality Date  . ABDOMINAL HYSTERECTOMY    . APPENDECTOMY  1970  . CERVICAL SPINE SURGERY     ant. neck/done x2/ screws out now  . CESAREAN SECTION     2  . CHOLECYSTECTOMY  1970  . FOOT SURGERY  years ago   "toes"/"heart stopped during surgery" per pt.only(not medical staff)  . TONSILLECTOMY AND ADENOIDECTOMY  1955  . VAGINAL DELIVERY     1     OB History   No obstetric history on file.     Family History  Problem Relation Age of Onset  . Arthritis Mother   . Diabetes Mother   . Heart disease Mother   . Arthritis Father   . Heart disease Father   . Heart attack Father   . Diabetes Grandchild   . Drug abuse Maternal Aunt   . Colon cancer Neg Hx     Social History   Tobacco Use  . Smoking status: Never Smoker  . Smokeless tobacco: Never Used  Substance Use Topics  . Alcohol use: No  . Drug use: No  Patient is in a facility  Home Medications Prior to Admission medications   Medication Sig Start Date End Date Taking? Authorizing Provider  acetaminophen (TYLENOL) 325 MG tablet Take 650 mg by mouth every 8 (eight) hours as needed for mild pain or headache.    [provider]  alendronate (FOSAMAX) 70 MG tablet Take 70  mg by mouth once a week. Take with a full glass of water on an empty stomach.    [provider]  b complex vitamins capsule Take 1 capsule by mouth daily.    [provider]  buPROPion (WELLBUTRIN SR) 100 MG 12 hr tablet Take 100 mg by mouth daily.    [provider]  Cholecalciferol (VITAMIN D3) 25 MCG (1000 UT) CAPS Take 1,000 Units by mouth daily.    [provider]  donepezil (ARICEPT) 10 MG tablet Take 10 mg by mouth daily.     [provider]  esomeprazole (NEXIUM) 40 MG capsule TAKE ONE CAPSULE BY MOUTH DAILY BEFORE BREAKFAST Patient not taking: Reported on 12/06/2019 07/31/14   [name], MD  estrogens, conjugated, (PREMARIN) 0.3 MG tablet Take 1 tablet (0.3 mg total) by mouth  daily. Patient not taking: Reported on 12/06/2019 03/08/16   [name], FNP  famotidine (PEPCID) 20 MG tablet Take 20 mg by mouth at bedtime.    [provider]  melatonin 5 MG TABS Take 5 mg by mouth at bedtime.    [provider]  mirabegron ER (MYRBETRIQ) 25 MG TB24 tablet Take 1 tablet (25 mg total) by mouth daily. Patient not taking: Reported on 12/06/2019 03/23/16   [name], MD  mirtazapine (REMERON) 7.5 MG tablet Take 7.5 mg by mouth at bedtime.    [provider]  pregabalin (LYRICA) 150 MG capsule Take 1 capsule (150 mg total) by mouth 2 (two) times daily. Patient not taking: Reported on 12/06/2019 03/06/16   [name], FNP  risperiDONE (RISPERDAL) 0.25 MG tablet Take 0.125 mg by mouth every 6 (six) hours as needed (agitation, agression).    [provider]  risperiDONE (RISPERDAL) 0.5 MG tablet Take 0.5 mg by mouth 2 (two) times daily.    [provider]  sertraline (ZOLOFT) 25 MG tablet Take 25 mg by mouth daily.    [provider]  sulfamethoxazole-trimethoprim (BACTRIM DS) 800-160 MG tablet Take 1 tablet by mouth 2 (two) times daily. 01/28/20   Rolland Porter, MD  traZODone (DESYREL) 50 MG tablet TAKE 1 TABLET  BY MOUTH AT BEDTIME AS NEEDED FOR SLEEP. Patient not taking: Reported on 12/06/2019 03/06/16   [name], FNP    Allergies    Codeine, Propoxyphene, Propoxyphene hcl, and Propoxyphene n-acetaminophen  Review of Systems   Review of Systems  Unable to perform ROS: Dementia    Physical Exam Updated Vital Signs BP (!) 111/45   Pulse 86   Temp 99.2 F (37.3 C) (Rectal)   Resp 20   SpO2 97%   Physical Exam Vitals and nursing note reviewed.  Constitutional:      Appearance: Normal appearance. She is normal weight.  HENT:     Head: Normocephalic and atraumatic.     Comments: Patient denies any pain when I palpate her head.    Nose: Nose normal.  Eyes:     Extraocular Movements:  Extraocular movements intact.     Conjunctiva/sclera: Conjunctivae normal.     Pupils: Pupils are equal, round, and reactive to light.  Neck:     Comments: Patient is wearing a [place] collar Cardiovascular:     Rate and Rhythm: Normal rate and regular rhythm.     Pulses: Normal pulses.     Heart sounds: No murmur heard.   Pulmonary:     Effort: Pulmonary effort is normal.     Breath sounds: Normal breath sounds.  Chest:     Chest wall: No tenderness.  Abdominal:     General: Abdomen is flat. Bowel sounds are normal.     Palpations: Abdomen is soft.     Tenderness: There is no abdominal tenderness.  Musculoskeletal:     Comments: Patient has no obvious deformity to her lower extremities.  However when I move her left lower extremity she states ouch but she cannot tell me where it hurts.  There is no obvious shortening of the leg or internal or external rotation. When I move her left leg it does not appear to be tender but she does seem very stiff in that leg.  She is noted to have a round purplish area in the mid left lower leg with redness and increased warmth of the skin distal and anterior to that lesion.  It is suspicious for cellulitis. She is noted to have some swelling of the dorsum of both feet.  She has good distal pulses.  Skin:    General: Skin is warm and dry.     Findings: Erythema present.  Neurological:     General: No focal deficit present.     Mental Status: She is alert.     Cranial Nerves: No cranial nerve deficit.  Psychiatric:        Mood and Affect: Mood normal.        Behavior: Behavior normal.        Thought Content: Thought content normal.           ED Results / Procedures / Treatments   Labs (all labs ordered are listed, but only abnormal results are displayed) Results for orders placed or performed during the hospital encounter of 01/28/20  Comprehensive metabolic panel  Result Value Ref Range   Sodium 136 135 - 145 mmol/L   Potassium  3.2 (L) 3.5 - 5.1 mmol/L   Chloride 102 98 - 111 mmol/L   CO2 25 22 - 32 mmol/L   Glucose, Bld 123 (H) 70 - 99 mg/dL   BUN 20 8 - 23 mg/dL   Creatinine, Ser 0.63 0.44 - 1.00 mg/dL   Calcium 8.6 (L) 8.9 - 10.3 mg/dL   Total Protein 7.6 6.5 - 8.1 g/dL   Albumin 3.5 3.5 - 5.0 g/dL   AST 24 15 - 41 U/L   ALT 25 0 - 44 U/L   Alkaline Phosphatase 71 38 - 126 U/L   Total Bilirubin 0.8 0.3 - 1.2 mg/dL   GFR calc non Af Amer >60 >60 mL/min   GFR calc Af Amer >60 >60 mL/min   Anion gap 9 5 - 15  CBC with Differential  Result Value Ref Range   WBC 9.6 4.0 - 10.5 K/uL   RBC 3.95 3.87 - 5.11 MIL/uL   Hemoglobin 11.4 (L) 12.0 - 15.0 g/dL   HCT 35.7 (L) 36 - 46 %   MCV 90.4 80.0 - 100.0 fL   MCH 28.9 26.0 - 34.0 pg   MCHC 31.9 30.0 - 36.0 g/dL   RDW 13.2 11.5 - 15.5 %   Platelets 294 150 - 400 K/uL   nRBC 0.0 0.0 - 0.2 %   Neutrophils Relative % 77 %   Neutro Abs 7.4 1.7 - 7.7 K/uL   Lymphocytes Relative 13 %   Lymphs Abs 1.3 0.7 - 4.0 K/uL   Monocytes Relative 7 %   Monocytes Absolute 0.7 0 - 1 K/uL   Eosinophils Relative 2 %   Eosinophils Absolute 0.2 0 -  0 K/uL   Basophils Relative 1 %   Basophils Absolute 0.1 0 - 0 K/uL   Immature Granulocytes 0 %   Abs Immature Granulocytes 0.03 0.00 - 0.07 K/uL   Laboratory interpretation all normal except hypokalemia, mild anemia    EKG None  Radiology DG Chest 1 View  Result Date: 01/28/2020 CLINICAL DATA:  86 year old female status post unwitnessed fall. EXAM: CHEST  1 VIEW COMPARISON:  Chest radiographs 01/04/2020 and earlier. FINDINGS: Lower lung volumes. Mediastinal contours remain normal. Visualized tracheal air column is within normal limits. Allowing for portable technique the lungs are clear. Partially visible chronic ACDF hardware. No acute osseous abnormality identified. Negative visible bowel gas pattern. IMPRESSION: No acute cardiopulmonary abnormality or acute traumatic injury identified. Electronically Signed   By: Genevie Ann  M.D.   On: 01/28/2020 02:34   CT Head Wo Contrast  Result Date: 01/28/2020 CLINICAL DATA:  86 year old female status post unwitnessed fall. Posterior head laceration. EXAM: CT HEAD WITHOUT CONTRAST TECHNIQUE: Contiguous axial images were obtained from the base of the skull through the vertex without intravenous contrast. COMPARISON:  Head CT 01/09/2020 and earlier. FINDINGS: Brain: Stable cerebral volume, ventricle size and configuration. Small chronic infarcts in the cerebellum are stable. Stable gray-white matter differentiation throughout the brain. No midline shift, ventriculomegaly, mass effect, evidence of mass lesion, intracranial hemorrhage or evidence of cortically based acute infarction. Vascular: Calcified atherosclerosis at the skull base. No suspicious intracranial vascular hyperdensity. Skull: No acute osseous abnormality identified. Sinuses/Orbits: Visualized paranasal sinuses and mastoids are stable and well pneumatized. Other: No discrete scalp soft tissue injury.  Stable orbits. IMPRESSION: 1. No acute intracranial abnormality or acute traumatic injury identified. 2. Stable cerebral volume loss and small chronic cerebellar infarcts. Electronically Signed   By: Genevie Ann M.D.   On: 01/28/2020 02:47   CT Cervical Spine Wo Contrast  Result Date: 01/28/2020 CLINICAL DATA:  86 year old female status post unwitnessed fall. Posterior head laceration. EXAM: CT CERVICAL SPINE WITHOUT CONTRAST TECHNIQUE: Multidetector CT imaging of the cervical spine was performed without intravenous contrast. Multiplanar CT image reconstructions were also generated. COMPARISON:  CT cervical spine 01/09/2020. FINDINGS: Study is mildly degraded by motion artifact despite repeated imaging attempts. Alignment: Stable straightening of lordosis. Posterior element alignment appears to remain stable. Skull base and vertebrae: Stable skull base, craniocervical alignment. Chronic corpectomy at C4 and C5, with C3 and C7  superimposed ACDF hardware. Stable C6 superior endplate subsidence. See additional hardware findings below. No acute osseous abnormality identified. Soft tissues and spinal canal: No prevertebral fluid or swelling. No visible canal hematoma. Negative noncontrast neck soft tissues. Disc levels: Very severe chronic C1-C2 degeneration, greater on the left. Superimposed C2-C3 ankylosis. The C3 cortical screws are intact. The C7 cortical screws are chronically fractured. There is developing left side facet ankylosis at C3-C4, and solid interbody arthrodesis at C6-C7. Upper chest: There are small bilateral C7 cervical ribs. Osteopenia and mild motion artifact. Grossly intact visible upper thoracic levels. Stable mild apical lung scarring and superimposed centrilobular emphysema. IMPRESSION: 1. Mild motion artifact. No acute traumatic injury identified in the cervical spine. 2. Stable postoperative changes from chronic C4 and C5 corpectomy with C3 and C7 ACDF. Chronically fractured C7 cortical screws. Chronic C2-C3 ankylosis. Superimposed severe chronic C1-C2 degeneration. 3. Emphysema (ICD10-J43.9). Electronically Signed   By: Genevie Ann M.D.   On: 01/28/2020 02:53   DG Hip Unilat W or Wo Pelvis 2-3 Views Left  Result Date: 01/28/2020 CLINICAL DATA:  86 year old female status  post unwitnessed fall. EXAM: DG HIP (WITH OR WITHOUT PELVIS) 2-3V LEFT COMPARISON:  CT Abdomen and Pelvis 03/01/2014. FINDINGS: Femoral heads are normally located. Hip joint spaces appear symmetric and normal for age. No pelvis fracture identified. Partially visible chronic dextroconvex lumbar scoliosis. Grossly intact proximal right femur. On AP and cross-table lateral views the proximal left femur appears intact. No acute osseous abnormality identified. IMPRESSION: No acute fracture or dislocation identified about the left hip or pelvis. Electronically Signed   By: Genevie Ann M.D.   On: 01/28/2020 02:32    Procedures .Marland KitchenLaceration  Repair  Date/Time: 01/28/2020 4:32 AM Performed by: Rolland Porter, MD Authorized by: Rolland Porter, MD   Consent:    Consent obtained:  Emergent situation Anesthesia (see MAR for exact dosages):    Anesthesia method:  None Laceration details:    Location:  Scalp   Length (cm):  1   Laceration depth: through dermis. Repair type:    Repair type:  Simple Pre-procedure details:    Preparation:  Patient was prepped and draped in usual sterile fashion and imaging obtained to evaluate for foreign bodies Exploration:    Hemostasis achieved with:  Direct pressure   Wound extent: no foreign bodies/material noted and no vascular damage noted     Contaminated: no   Treatment:    Area cleansed with:  Saline   Amount of cleaning:  Standard   Visualized foreign bodies/material removed: no   Skin repair:    Repair method:  Staples   Number of staples:  2 Post-procedure details:    Dressing:  Open (no dressing)   (including critical care time)  Medications Ordered in ED Medications  potassium chloride 20 MEQ/15ML (10%) solution 40 mEq (has no administration in time range)  sulfamethoxazole-trimethoprim (BACTRIM DS) 800-160 MG per tablet 1 tablet (has no administration in time range)    ED Course  I have reviewed the triage vital signs and the nursing notes.  Pertinent labs & imaging results that were available during my care of the patient were reviewed by me and considered in my medical decision making (see chart for details).    MDM Rules/Calculators/A&P                          X-rays were done of the left hip since that seem to be where she had some pain, the pelvis view also show her right hip.  She did not seem to have pain with range of motion of it.  Portable chest x-ray was done and some  basic laboratory testing was done.   After reviewing patient's radiology studies I went back to look at patient to see if she had a laceration of her scalp.  Her c-collar was removed and patient  would not roll over for me so nursing techs rolled the patient over.  She had a 1 cm linear vertically placed laceration of her right posterior scalp.  Patient was started on Septra DS for her cellulitis of her right lower extremity.  She was given 1 dose of oral potassium liquid for her hypokalemia.  She was discharged back to her facility.  Final Clinical Impression(s) / ED Diagnoses Final diagnoses:  Fall at nursing home, initial encounter  Laceration of occipital region of scalp, initial encounter  Cellulitis of right lower leg  Hypokalemia    Rx / DC Orders ED Discharge Orders         Ordered    sulfamethoxazole-trimethoprim (BACTRIM  DS) 800-160 MG tablet  2 times daily     Discontinue  Reprint     01/28/20 0437         Plan discharge  Rolland Porter, MD, Barbette Or, MD 01/28/20 [phone]

## 2020-02-04 ENCOUNTER — Other Ambulatory Visit: Payer: Self-pay

## 2020-02-04 ENCOUNTER — Encounter: Payer: Medicare PPO | Admitting: Internal Medicine

## 2020-02-09 ENCOUNTER — Other Ambulatory Visit: Payer: Self-pay

## 2020-02-09 ENCOUNTER — Emergency Department (HOSPITAL_COMMUNITY)
Admission: EM | Admit: 2020-02-09 | Discharge: 2020-02-09 | Disposition: A | Discharge status: Home / Self Care | Payer: Medicare PPO | Attending: Emergency Medicine | Admitting: Emergency Medicine

## 2020-02-09 ENCOUNTER — Emergency Department (HOSPITAL_COMMUNITY): Payer: Medicare PPO

## 2020-02-09 ENCOUNTER — Encounter (HOSPITAL_COMMUNITY): Payer: Self-pay | Admitting: Emergency Medicine

## 2020-02-09 DIAGNOSIS — I10 Essential (primary) hypertension: Secondary | ICD-10-CM | POA: Diagnosis not present

## 2020-02-09 DIAGNOSIS — R791 Abnormal coagulation profile: Secondary | ICD-10-CM | POA: Diagnosis not present

## 2020-02-09 DIAGNOSIS — R4781 Slurred speech: Secondary | ICD-10-CM | POA: Diagnosis not present

## 2020-02-09 DIAGNOSIS — R4701 Aphasia: Secondary | ICD-10-CM | POA: Insufficient documentation

## 2020-02-09 DIAGNOSIS — F0390 Unspecified dementia without behavioral disturbance: Secondary | ICD-10-CM | POA: Insufficient documentation

## 2020-02-09 DIAGNOSIS — R29810 Facial weakness: Secondary | ICD-10-CM | POA: Diagnosis not present

## 2020-02-09 DIAGNOSIS — Z79899 Other long term (current) drug therapy: Secondary | ICD-10-CM | POA: Diagnosis not present

## 2020-02-09 LAB — CBC
HCT: 35.4 % — ABNORMAL LOW (ref 36.0–46.0)
Hemoglobin: 11 g/dL — ABNORMAL LOW (ref 12.0–15.0)
MCH: 28.6 pg (ref 26.0–34.0)
MCHC: 31.1 g/dL (ref 30.0–36.0)
MCV: 91.9 fL (ref 80.0–100.0)
Platelets: 329 10*3/uL (ref 150–400)
RBC: 3.85 MIL/uL — ABNORMAL LOW (ref 3.87–5.11)
RDW: 14 % (ref 11.5–15.5)
WBC: 6.6 10*3/uL (ref 4.0–10.5)
nRBC: 0 % (ref 0.0–0.2)

## 2020-02-09 LAB — DIFFERENTIAL
Abs Immature Granulocytes: 0.03 10*3/uL (ref 0.00–0.07)
Basophils Absolute: 0.1 10*3/uL (ref 0.0–0.1)
Basophils Relative: 1 %
Eosinophils Absolute: 0.2 10*3/uL (ref 0.0–0.5)
Eosinophils Relative: 4 %
Immature Granulocytes: 1 %
Lymphocytes Relative: 22 %
Lymphs Abs: 1.4 10*3/uL (ref 0.7–4.0)
Monocytes Absolute: 0.6 10*3/uL (ref 0.1–1.0)
Monocytes Relative: 10 %
Neutro Abs: 4.2 10*3/uL (ref 1.7–7.7)
Neutrophils Relative %: 62 %

## 2020-02-09 LAB — COMPREHENSIVE METABOLIC PANEL
ALT: 29 U/L (ref 0–44)
AST: 37 U/L (ref 15–41)
Albumin: 3.5 g/dL (ref 3.5–5.0)
Alkaline Phosphatase: 68 U/L (ref 38–126)
Anion gap: 10 (ref 5–15)
BUN: 13 mg/dL (ref 8–23)
CO2: 25 mmol/L (ref 22–32)
Calcium: 9 mg/dL (ref 8.9–10.3)
Chloride: 100 mmol/L (ref 98–111)
Creatinine, Ser: 0.97 mg/dL (ref 0.44–1.00)
GFR calc Af Amer: >60 mL/min (ref >60)
GFR calc non Af Amer: 53 mL/min — ABNORMAL LOW (ref >60)
Glucose, Bld: 93 mg/dL (ref 70–99)
Potassium: 4.3 mmol/L (ref 3.5–5.1)
Sodium: 135 mmol/L (ref 135–145)
Total Bilirubin: 0.3 mg/dL (ref 0.3–1.2)
Total Protein: 7.4 g/dL (ref 6.5–8.1)

## 2020-02-09 LAB — URINALYSIS, ROUTINE W REFLEX MICROSCOPIC
Bilirubin Urine: NEGATIVE
Glucose, UA: NEGATIVE mg/dL
Hgb urine dipstick: NEGATIVE
Ketones, ur: NEGATIVE mg/dL
Leukocytes,Ua: NEGATIVE
Nitrite: NEGATIVE
Protein, ur: NEGATIVE mg/dL
Specific Gravity, Urine: 1.011 (ref 1.005–1.030)
pH: 7 (ref 5.0–8.0)

## 2020-02-09 LAB — PROTIME-INR
INR: 1 (ref 0.8–1.2)
Prothrombin Time: 13.2 seconds (ref 11.4–15.2)

## 2020-02-09 LAB — RAPID URINE DRUG SCREEN, HOSP PERFORMED
Amphetamines: NOT DETECTED
Barbiturates: NOT DETECTED
Benzodiazepines: NOT DETECTED
Cocaine: NOT DETECTED
Opiates: NOT DETECTED
Tetrahydrocannabinol: NOT DETECTED

## 2020-02-09 LAB — APTT: aPTT: 30 seconds (ref 24–36)

## 2020-02-09 MED ORDER — SODIUM CHLORIDE 0.9% FLUSH
3.0000 mL | Freq: Once | INTRAVENOUS | Status: DC
Start: 2020-02-09 — End: 2020-02-10

## 2020-02-09 MED ORDER — ASPIRIN 81 MG PO CHEW
81.0000 mg | CHEWABLE_TABLET | Freq: Every day | ORAL | 0 refills | Status: DC
Start: 2020-02-09 — End: 2020-02-10

## 2020-02-09 MED ORDER — LORAZEPAM 2 MG/ML IJ SOLN
0.5000 mg | Freq: Once | INTRAMUSCULAR | Status: DC
  Filled 2020-02-09: qty 1

## 2020-02-09 NOTE — Discharge Instructions (Addendum)
At this time there does not appear to be the presence of an emergent medical condition, however there is always the potential for conditions to change. Please read and follow the below instructions.  Please return to the Emergency Department immediately for any new or worsening symptoms. Please be sure to follow up with your Primary Care Provider within one week regarding your visit today; please call their office to schedule an appointment even if you are feeling better for a follow-up visit. Patient to take the baby aspirin every day as prescribed by the neurologist.  Get help right away if: You have any symptoms of stroke. "BE FAST" is an easy way to remember the main warning signs: B - Balance. Signs are dizziness, sudden trouble walking, or loss of balance. E - Eyes. Signs are trouble seeing or a sudden change in how you see. F - Face. Signs are sudden weakness or loss of feeling of the face, or the face or eyelid drooping on one side. A - Arms. Signs are weakness or loss of feeling in an arm. This happens suddenly and usually on one side of the body. S - Speech. Signs are sudden trouble speaking, slurred speech, or trouble understanding what people say. T - Time. Time to call emergency services. Write down what time symptoms started. You have other signs of stroke, such as: A sudden, very bad headache with no known cause. Feeling sick to your stomach (nausea). Throwing up (vomiting). Jerky movements you cannot control (seizure). These symptoms may represent a serious problem that is an emergency. Do not wait to see if the symptoms will go away. Get medical help right away. Call your local emergency services (911 in the [place] Do not drive yourself to the hospital.  Please read the additional information packets attached to your discharge summary.  Do not take your medicine if  develop an itchy rash, swelling in your mouth or lips, or difficulty breathing; call 911 and seek immediate  emergency medical attention if this occurs.  You may review your lab tests and imaging results in their entirety on your MyChart account.  Please discuss all results of fully with your primary care provider and other specialist at your follow-up visit.  Note: Portions of this text may have been transcribed using voice recognition software. Every effort was made to ensure accuracy; however, inadvertent computerized transcription errors may still be present.

## 2020-02-09 NOTE — ED Triage Notes (Signed)
Pt to triage via [facility] from [facility].  Staff reports slurred speech and L sided weakness.  LKW Thursday.  Pt has dementia.  Difficulty following commands.

## 2020-02-09 NOTE — ED Provider Notes (Signed)
[facility] EMERGENCY DEPARTMENT Provider Note   CSN: [id number] Arrival date & time: 02/09/20  0805     History Chief Complaint  Patient presents with  . Aphasia  . Weakness    Sandy Merritt is a 86 y.o. female history of dementia, lupus, GERD, hyperlipidemia, MVP.  Patient sent in today by [facility] for concern of left-sided weakness, left-sided facial droop and slurred speech.  She was sent in by EMS, last known well was Thursday, 02/06/2020.  22 of history is obtained from patient's daughter who is at bedside, she reports that she last saw patient on Thursday or Friday she cannot quite remember.  She was called by the nursing facility today and they reported the slurred speech and left-sided weakness.  When patient's daughter saw her today she reports that patient speech is at baseline and she has normal strength, question possible left facial droop around the eye but otherwise she reports her mother is at baseline.    Level 5 caveat dementia.  HPI     Past Medical History:  Diagnosis Date  . Arthritis    rheumatoid  . Chicken pox   . GERD (gastroesophageal reflux disease)   . Glaucoma   . Headaches, cluster   . Lupus ([place])   . Urine incontinence     Patient Active Problem List   Diagnosis Date Noted  . Vaginal itching 05/03/2016  . Vision changes 03/16/2016  . Vaginal irritation 03/01/2016  . Fatigue 09/29/2015  . Degenerative arthritis of cervical spine 08/04/2015  . Urinary frequency 05/24/2015  . Toenail deformity 03/03/2015  . Basal cell carcinoma of skin of breast 03/03/2015  . Insomnia 07/31/2014  . Unspecified hereditary and idiopathic peripheral neuropathy 04/04/2014  . Benign paroxysmal positional vertigo 03/20/2014  . Vertigo 03/01/2014  . Lower abdominal pain 03/01/2014  . Anemia, unspecified 01/23/2014  . Lupus ([place]) 05/16/2011  . ASTHMA 08/11/2010  . HYPERLIPIDEMIA 08/10/2010  . ESOPHAGEAL  STRICTURE 06/25/2008  . GERD 06/25/2008  . ANXIETY, CHRONIC, HX OF 06/25/2008  . MITRAL VALVE PROLAPSE, HX OF 06/25/2008  . Essential hypertension 01/30/2008  . DIVERTICULOSIS, COLON 01/30/2008  . OSTEOPENIA 01/30/2008    Past Surgical History:  Procedure Laterality Date  . ABDOMINAL HYSTERECTOMY    . APPENDECTOMY  1970  . CERVICAL SPINE SURGERY     ant. neck/done x2/ screws out now  . CESAREAN SECTION     2  . CHOLECYSTECTOMY  1970  . FOOT SURGERY  years ago   "toes"/"heart stopped during surgery" per pt.only(not medical staff)  . TONSILLECTOMY AND ADENOIDECTOMY  1955  . VAGINAL DELIVERY     1     OB History   No obstetric history on file.     Family History  Problem Relation Age of Onset  . Arthritis Mother   . Diabetes Mother   . Heart disease Mother   . Arthritis Father   . Heart disease Father   . Heart attack Father   . Diabetes Grandchild   . Drug abuse Maternal Aunt   . Colon cancer Neg Hx     Social History   Tobacco Use  . Smoking status: Never Smoker  . Smokeless tobacco: Never Used  Substance Use Topics  . Alcohol use: No  . Drug use: No    Home Medications Prior to Admission medications   Medication Sig Start Date End Date Taking? Authorizing Provider  acetaminophen (TYLENOL) 325 MG tablet Take 650 mg by mouth  every 8 (eight) hours as needed for mild pain or headache.    [provider]  alendronate (FOSAMAX) 70 MG tablet Take 70 mg by mouth once a week. Take with a full glass of water on an empty stomach.    [provider]  aspirin 81 MG chewable tablet Chew 1 tablet (81 mg total) by mouth daily. 02/09/20   Nuala Alpha A, PA-C  b complex vitamins capsule Take 1 capsule by mouth daily.    [provider]  buPROPion (WELLBUTRIN SR) 100 MG 12 hr tablet Take 100 mg by mouth daily.    [provider]  Cholecalciferol (VITAMIN D3) 25 MCG (1000 UT) CAPS Take 1,000 Units by mouth daily.    [provider]  donepezil (ARICEPT) 10 MG tablet Take 10 mg by mouth daily.     [provider]  esomeprazole (NEXIUM) 40 MG capsule TAKE ONE CAPSULE BY MOUTH DAILY BEFORE BREAKFAST Patient not taking: Reported on 12/06/2019 07/31/14   [name], MD  estrogens, conjugated, (PREMARIN) 0.3 MG tablet Take 1 tablet (0.3 mg total) by mouth daily. Patient not taking: Reported on 12/06/2019 03/08/16   [name], FNP  famotidine (PEPCID) 20 MG tablet Take 20 mg by mouth at bedtime.    [provider]  melatonin 5 MG TABS Take 5 mg by mouth at bedtime.    [provider]  mirabegron ER (MYRBETRIQ) 25 MG TB24 tablet Take 1 tablet (25 mg total) by mouth daily. Patient not taking: Reported on 12/06/2019 03/23/16   [name], MD  mirtazapine (REMERON) 7.5 MG tablet Take 7.5 mg by mouth at bedtime.    [provider]  pregabalin (LYRICA) 150 MG capsule Take 1 capsule (150 mg total) by mouth 2 (two) times daily. Patient not taking: Reported on 12/06/2019 03/06/16   [name], FNP  risperiDONE (RISPERDAL) 0.25 MG tablet Take 0.125 mg by mouth every 6 (six) hours as needed (agitation, agression).    [provider]  risperiDONE (RISPERDAL) 0.5 MG tablet Take 0.5 mg by mouth 2 (two) times daily.    [provider]  sertraline (ZOLOFT) 25 MG tablet Take 25 mg by mouth daily.    [provider]  sulfamethoxazole-trimethoprim (BACTRIM DS) 800-160 MG tablet Take 1 tablet by mouth 2 (two) times daily. 01/28/20   Rolland Porter, MD  traZODone (DESYREL) 50 MG tablet TAKE 1 TABLET  BY MOUTH AT BEDTIME AS NEEDED FOR SLEEP. Patient not taking: Reported on 12/06/2019 03/06/16   [name], FNP    Allergies    Codeine, Propoxyphene, Propoxyphene hcl, and Propoxyphene n-acetaminophen  Review of Systems   Review of Systems  Unable to perform ROS: Dementia    Physical Exam Updated Vital Signs BP (!) 127/97 (BP Location: Left  Arm)   Pulse 81   Temp 98.3 F (36.8 C) (Axillary)   Resp 16   SpO2 97%   Physical Exam Constitutional:      General: She is not in acute distress.    Appearance: Normal appearance. She is well-developed. She is not ill-appearing or diaphoretic.  HENT:     Head: Normocephalic and atraumatic.  Eyes:     General: Vision grossly intact. Gaze aligned appropriately.     Pupils: Pupils are equal, round, and reactive to light.  Neck:     Trachea: Trachea and phonation normal.  Pulmonary:     Effort: Pulmonary effort is normal. No respiratory distress.  Abdominal:  General: There is no distension.     Palpations: Abdomen is soft.     Tenderness: There is no abdominal tenderness. There is no guarding or rebound.  Musculoskeletal:        General: Normal range of motion.     Cervical back: Normal range of motion.     Comments: No midline C/T/L spinal tenderness to palpation, no paraspinal muscle tenderness, no deformity, crepitus, or step-off noted. No sign of injury to the neck or back.  Pelvis stable to compression bilaterally without pain.  Spontaneous movement of bilateral upper extremities without pain.  No evidence of acute traumatic injury of the upper extremities.  All major joints mobilized with appropriate information for age.  No evidence of traumatic injury to the bilateral lower extremities, movement spontaneous.  Trace bilateral lower extremity edema.  Skin:    General: Skin is warm and dry.  Neurological:     Mental Status: She is alert.     GCS: GCS eye subscore is 4. GCS verbal subscore is 4. GCS motor subscore is 6.     Comments: Mental status baseline per patient's daughter at bedside.  Alert to self and place.  Patient is agitated, intermittently will follow commands.  Argumentative and fights against staff and exam.  Question left facial droop around the V2 distribution however no other cranial nerve deficits on exam which is limited by patient compliance.  Appears to  have symmetric strength with bilateral upper and lower extremities when fighting against exam.  Sensation appears intact bilaterally.  Psychiatric:        Behavior: Behavior is agitated.     ED Results / Procedures / Treatments   Labs (all labs ordered are listed, but only abnormal results are displayed) Labs Reviewed  CBC - Abnormal; Notable for the following components:      Result Value   RBC 3.85 (*)    Hemoglobin 11.0 (*)    HCT 35.4 (*)    All other components within normal limits  COMPREHENSIVE METABOLIC PANEL - Abnormal; Notable for the following components:   GFR calc non Af Amer 53 (*)    All other components within normal limits  PROTIME-INR  APTT  DIFFERENTIAL  URINALYSIS, ROUTINE W REFLEX MICROSCOPIC  RAPID URINE DRUG SCREEN, HOSP PERFORMED    EKG EKG Interpretation  Date/Time:  Sunday February 09 2020 08:19:47 EDT Ventricular Rate:  74 PR Interval:  150 QRS Duration: 60 QT Interval:  382 QTC Calculation: 424 R Axis:   48 Text Interpretation: Sinus rhythm with Premature atrial complexes T wave abnormality, consider inferolateral ischemia Abnormal ECG Confirmed by Dene Gentry [phone]) on 02/09/2020 11:43:39 AM   Radiology CT HEAD WO CONTRAST  Result Date: 02/09/2020 CLINICAL DATA:  Neurologic deficit. Slurred speech and left-sided weakness. Dementia. EXAM: CT HEAD WITHOUT CONTRAST TECHNIQUE: Contiguous axial images were obtained from the base of the skull through the vertex without intravenous contrast. COMPARISON:  01/28/2020 FINDINGS: Brain: Examination demonstrates age related atrophy which is stable. No focal mass, mass effect, shift of midline structures or acute hemorrhage. No evidence of acute infarction. Evidence of mild chronic ischemic microvascular disease. Vascular: No hyperdense vessel or unexpected calcification. Skull: Normal. Negative for fracture or focal lesion. Sinuses/Orbits: No acute finding. Other: None. IMPRESSION: 1.  No acute findings. 2.   Chronic ischemic microvascular disease and age related atrophy. Electronically Signed   By: Marin Olp M.D.   On: 02/09/2020 09:05    Procedures Procedures (including critical care time)  Medications  Ordered in ED Medications  sodium chloride flush (NS) 0.9 % injection 3 mL (3 mLs Intravenous Not Given 02/09/20 1126)    ED Course  I have reviewed the triage vital signs and the nursing notes.  Pertinent labs & imaging results that were available during my care of the patient were reviewed by me and considered in my medical decision making (see chart for details).  Clinical Course as of Feb 08 1613  Sun Feb 09, 2020  1209 MRI head   [BM]    Clinical Course User Index [BM] Gari Crown   MDM Rules/Calculators/A&P                          Additional history obtained from: 1. Nursing notes from this visit. 2. Family, patient's daughter at bedside. ------------------------------- I ordered, reviewed and interpreted labs which include: Urinalysis shows no evidence of infection. UDS negative. PT INR and APTT within normal limits. CBC shows no leukocytosis to suggest infection, hemoglobin of 11.0 appears similar to prior. CMP shows no acute electrolyte derangement, AKI, LFT elevation or gap.  CT Head:  IMPRESSION:  1. No acute findings.    2. Chronic ischemic microvascular disease and age related atrophy.   EKG: Sinus rhythm with Premature atrial complexes T wave abnormality, consider inferolateral ischemia Abnormal ECG Confirmed by Dene Gentry [phone]) on 02/09/2020 11:43:39 AM ----------------- Discussed case with attending physician Dr. Francia Greaves, will consult neurology for further recommendations. - 12:09 PM: Discussed case with neurologist Dr. Lorrin Goodell, advises that we obtain MRI head without contrast.  Neurology to round on patient. - Patient reassessed resting comfortably no acute distress, daughter states understanding of care plan and is  agreeable. - 3:45 PM: In person discussion with Dr. Lorrin Goodell, he had conversations with both of patient's daughters.  Decision was made to start patient on baby aspirin daily and discharged back to facility.  No indication for MRI at this time as patient would not be eligible for any higher treatment than aspirin. - Patient reassessed resting comfortably no acute distress reports that she is hungry and would like some food.  Patient's daughter's husband is now at bedside, they state understanding of care plan and are agreeable. Discussed plan of care with Dr. Francia Greaves who agrees.  Note: Portions of this report may have been transcribed using voice recognition software. Every effort was made to ensure accuracy; however, inadvertent computerized transcription errors may still be present. Final Clinical Impression(s) / ED Diagnoses Final diagnoses:  Slurred speech    Rx / DC Orders ED Discharge Orders         Ordered    aspirin 81 MG chewable tablet  Daily     Discontinue  Reprint     02/09/20 1610           Gari Crown 02/09/20 1614    Valarie Merino, MD 02/10/20 [phone]

## 2020-02-09 NOTE — Consult Note (Signed)
NEURO HOSPITALIST CONSULT NOTE   Requesting Physician: Dr. Francia Greaves    Chief Complaint: left side weakness and slurred speech  History obtained from:  Chart and Daughter at bedside  HPI:                                                                                                                                         Sandy Merritt is an 86 y.o. female with a history of dementia, lupus, GERD, hyperlipidemia, cluster headaches and arthritis.   She was admitted for concerns from [facility] facility of left side weakness, facial droop and slurred speech. Her last well per daughter at bedside was 3 days prior to admission 02/06/20, on Thursday. The patients daughter reports that she feels the patient is at baseline, she said it is always very difficult to understand what she is saying and that her dementia is advanced and has the speed of her memory decline has seemed to have been more rapid over. The patient is easily agitated baseline and her daughter wonders if medications to help calm her are giving the staff at [facility] place the impression that she has worsened or had a stroke, when she actually has not. She said the slight droop of her left eye is baseline. She also feels her strength is baseline and reports that she is able to walk with assistance, but has very frequent falls.   The patients daughter is her primary decision maker and does not feel a Brain MRI is necessary at this point, given she appears to be at baseline. She is also not interested in pursing aggressive treatments with her advanced dementia.   CT Head: No Acute findings, chronic microvascular disease and age related atrophy  Date last known well: 02/06/20 (3 days prior to ED presentation) tPA Given: No, outside tPA window Modified Rankin: Rankin Score=4  Past Medical History:  Diagnosis Date  . Arthritis    rheumatoid  . Chicken pox   . GERD (gastroesophageal reflux disease)   .  Glaucoma   . Headaches, cluster   . Lupus ([place])   . Urine incontinence     Past Surgical History:  Procedure Laterality Date  . ABDOMINAL HYSTERECTOMY    . APPENDECTOMY  1970  . CERVICAL SPINE SURGERY     ant. neck/done x2/ screws out now  . CESAREAN SECTION     2  . CHOLECYSTECTOMY  1970  . FOOT SURGERY  years ago   "toes"/"heart stopped during surgery" per pt.only(not medical staff)  . TONSILLECTOMY AND ADENOIDECTOMY  1955  . VAGINAL DELIVERY     1    Family History  Problem Relation Age of Onset  . Arthritis Mother   . Diabetes Mother   . Heart disease Mother   .  Arthritis Father   . Heart disease Father   . Heart attack Father   . Diabetes Grandchild   . Drug abuse Maternal Aunt   . Colon cancer Neg Hx          Social History:  reports that she has never smoked. She has never used smokeless tobacco. She reports that she does not drink alcohol and does not use drugs.  Allergies:  Allergies  Allergen Reactions  . Codeine Nausea And Vomiting  . Propoxyphene Nausea And Vomiting    Agent: Darvon  . Propoxyphene Hcl Nausea And Vomiting    Agent: Darvon  . Propoxyphene N-Acetaminophen Nausea And Vomiting    Agent: Darvocet    Medications:                                                                                                                           I have reviewed the patient's current medications.  ROS:                                                                                                                                       History obtained from unobtainable from patient due to mental status  General Examination:                                                                                                      Blood pressure (!) 127/97, pulse 81, temperature 98.3 F (36.8 C), temperature source Axillary, resp. rate 16, SpO2 97 %.  Physical Exam  Constitutional: Appears well-developed and well-nourished.  Psych: advanced dementia Eyes:  Normal external eye and conjunctiva. HENT: Normocephalic, no lesions, without obvious abnormality.   Musculoskeletal-no joint tenderness, deformity or swelling Cardiovascular: Normal rate and regular rhythm.  Respiratory: Effort normal, non-labored breathing saturations WNL GI: Soft.  No distension. There is no tenderness.  Skin: WDI  Neurological Examination Mental Status: Not able to answer orientation questions. Speech is very mumbled and incomprehensible, but  occasional has brief sentences that are understandable. She has a difficult time following basic verbal commands and does a bit better with demonstration, although following demonstration is also inconsistent.  Cranial Nerves: II:  Visual [place] grossly normal,  III,IV, VI:  Slight ptosis left eye, extra-ocular motions intact bilaterally,  V,VII: smile symmetric, facial light touch sensation normal bilaterally VIII: hearing normal bilaterally IX,X: uvula rises symmetrically XI: bilateral shoulder shrug XII: midline tongue extension Motor: Moving all extremities spontaneously, able to hold both arms to gravity without drift. Participation in lower extremity testing was more difficult, she was able to lift her lower extremities, however refused to keep them lifted, which may have been related to effort.  Tone and bulk:normal tone throughout; no atrophy noted Sensory: light touch intact Cerebellar: Patient did not participate in finger to nose or heel to shin testing.    Lab Results: Basic Metabolic Panel: Recent Labs  Lab 02/09/20 0818  NA 135  K 4.3  CL 100  CO2 25  GLUCOSE 93  BUN 13  CREATININE 0.97  CALCIUM 9.0    CBC: Recent Labs  Lab 02/09/20 0818  WBC 6.6  NEUTROABS 4.2  HGB 11.0*  HCT 35.4*  MCV 91.9  PLT 329    Lipid Panel: No results for input(s): CHOL, TRIG, HDL, CHOLHDL, VLDL, LDLCALC in the last 168 hours.  CBG: No results for input(s): GLUCAP in the last 168 hours.  Imaging: CT HEAD WO  CONTRAST  Result Date: 02/09/2020 CLINICAL DATA:  Neurologic deficit. Slurred speech and left-sided weakness. Dementia. EXAM: CT HEAD WITHOUT CONTRAST TECHNIQUE: Contiguous axial images were obtained from the base of the skull through the vertex without intravenous contrast. COMPARISON:  01/28/2020 FINDINGS: Brain: Examination demonstrates age related atrophy which is stable. No focal mass, mass effect, shift of midline structures or acute hemorrhage. No evidence of acute infarction. Evidence of mild chronic ischemic microvascular disease. Vascular: No hyperdense vessel or unexpected calcification. Skull: Normal. Negative for fracture or focal lesion. Sinuses/Orbits: No acute finding. Other: None. IMPRESSION: 1.  No acute findings. 2.  Chronic ischemic microvascular disease and age related atrophy. Electronically Signed   By: Marin Olp M.D.   On: 02/09/2020 09:05    Assessment: 86 y.o. female with a history of dementia, lupus, GERD, hyperlipidemia, cluster headaches and arthritis. Presenting from Nursing facility via EMS with left side weakness, facial droop and slurred speech. Daughter at bedside reports this is actually her baseline and she last saw her normal in this same state 3 days prior.   Daughter Sandy Merritt is primary decision making and does not wish to pursue any aggressive treatment or imaging that will not alter the plan of care. Dr. Lorrin Goodell discussed her previous lacunar infarcts and the option of aspirin. Her daughter would like to pursue aspirin, however in discussion it was determined that a brain MRI would not change management. If an indication for anticoagulation for stroke prevention were to be identified on further work-up, this was felt to be high risk in this patient with many recent falls, including recent head stitches due to a fall.   Stroke Risk Factors - hyperlipidemia  Impression: Possible TIA  Recommendations: --Not pursing any additional stroke work-up testing as  family does not want aggressive treatment and it would not change the medical management plan at this point. CT Head Negative. No Brain MRI.  --Starting aspirin 81mg  PO daily for stroke prevention, known lacunar infarcts on previous CT  Above recommendations dicused with ED provider by Dr.  Khaliqdina   Additional review and recommendations to follow from attending neurologists.    Gwenyth Bouillon FNP-C [facility]  02/09/2020, 3:43 PM

## 2020-02-09 NOTE — ED Notes (Signed)
Unable to get pt's oral temp. Pt agitated.

## 2020-02-10 ENCOUNTER — Non-Acute Institutional Stay: Payer: Medicare PPO | Admitting: Internal Medicine

## 2020-02-10 ENCOUNTER — Encounter: Payer: Self-pay | Admitting: Internal Medicine

## 2020-02-10 DIAGNOSIS — Z515 Encounter for palliative care: Secondary | ICD-10-CM

## 2020-02-10 DIAGNOSIS — Z7189 Other specified counseling: Secondary | ICD-10-CM

## 2020-02-10 NOTE — Progress Notes (Signed)
02/10/2020 [facility] Consult Note Telephone: [phone]  Fax: [phone]  PATIENT NAME: Sandy Merritt DOB: [birth year] MRN: [id number]  [place] Rm 13(08/31/2019)  PRIMARY CARE / REFERRING PROVIDER: Robet Leu, NP  RESPONSIBLE PARTY:  [place] (Daughter/HCPOA) [phone] (Mobile), [email or web address]. Sandy Merritt (daughter) [phone]. Sandy Merritt (son) [phone]  ASSESSMENT / RECOMMENDATIONS:  1. Advance Care Planning: A. Directives:  Discussed use of Cardiopulmonary Resuscitation in the event of an arrest. Daughter/HCPOA Sandy Merritt feels confident that a DNR order would reflect patients wishes but wishes to discuss this with her siblings. We discussed components of the MOST form. Sandy Merritt is leaning towards comfort level of care, but again wishes to discuss specifics with the family.  Ill e-mail to [facility] some patient education material regarding the MOST that she can share and discuss.  B. Goals of Care: Comfort  2. Cognitive / Functional status, Symptom Management: Staff report that patient historically was combative/lashing out at care givers. Medication adjustment with improvement of behaviors (resistant to care; lashing out) but now with excessive somnolence. She is asleep more that awake. Her current regimen includes Risperdal 0.75mg  bid, Zoloft 50mg  qd, Wellbutrin 100mg  qd, Aricept 10mg  qd, melatonin 5mg  qhs. Sandy Merritt believes patient no longer recognizes her. Patients speech is non-sensical. Patient is resistant to personal care, often lashing out.   -consider decreasing Risperdal to 0.5mg  bid; further adjustments pnd patient behaviors/improved wakefulness.  Patient has shown a dramatic functional decline. She was ambulatory without assist and living on her own just 7 months ago. She is now wheelchair bound. She is incontinent of both bowel and bladder. She is totally dependent for dressing and hygiene. Staff  are assisting more with feeding, They report patient doesnt appear to have lost weight.   3. Family / Community Supports: Widowed. Two daughters and son who are involved in care as they are able. I will call daughter again after visit.  4. Follow up Palliative Care Visit: Plan in 2-3 months and prn  I spent 60 minutes providing this consultation from 10:30am to 11:30am. More than 50% of the time in this consultation was spent coordinating communication.   HISTORY OF PRESENT ILLNESS:  Sandy Merritt is an 86 y.o. female h/o dementia with adjustment disorder, insomnia, osteoporosis, lupus, GERD, esophageal stricture, diverticulosis, MVP, and degenerative arthritis C spine. Broken shoulder after fall 08/2019. -02/09/20 ER Left facial droop. Head CT no acute abnl. No further w/u (such as MRI head) as pt would not be eligible for any higher treatment than aspirin. - 5 prior ER visits last 3 months for falls  Palliative Care was asked to help address goals of care.   CODE STATUS: Full Code  PPS: 30%  HOSPICE ELIGIBILITY/DIAGNOSIS: TBD PAST MEDICAL HISTORY:  Past Medical History:  Diagnosis Date   Arthritis    rheumatoid   Chicken pox    GERD (gastroesophageal reflux disease)    Glaucoma    Headaches, cluster    Lupus (HCC)    Urine incontinence     SOCIAL HX:  Social History   Tobacco Use   Smoking status: Never Smoker   Smokeless tobacco: Never Used  Substance Use Topics   Alcohol use: No    ALLERGIES:  Allergies  Allergen Reactions   Codeine Nausea And Vomiting   Propoxyphene Nausea And Vomiting    Agent: Darvon   Propoxyphene Hcl Nausea And Vomiting    Agent: Darvon   Propoxyphene N-Acetaminophen Nausea And Vomiting  Agent: Darvocet     PERTINENT MEDICATIONS:  Outpatient Encounter Medications as of 02/10/2020  Medication Sig   acetaminophen (TYLENOL) 325 MG tablet Take 650 mg by mouth every 8 (eight) hours as needed for mild pain or headache.    alendronate (FOSAMAX) 70 MG tablet Take 70 mg by mouth once a week. Take with a full glass of water on an empty stomach.   b complex vitamins capsule Take 1 capsule by mouth daily.   buPROPion (WELLBUTRIN SR) 100 MG 12 hr tablet Take 100 mg by mouth daily.   Cholecalciferol (VITAMIN D3) 25 MCG (1000 UT) CAPS Take 1,000 Units by mouth daily.   donepezil (ARICEPT) 10 MG tablet Take 10 mg by mouth daily.    famotidine (PEPCID) 20 MG tablet Take 20 mg by mouth at bedtime.   melatonin 5 MG TABS Take 5 mg by mouth at bedtime.   mirtazapine (REMERON) 7.5 MG tablet Take 7.5 mg by mouth at bedtime.   risperiDONE (RISPERDAL) 0.25 MG tablet Take 0.25 mg by mouth 2 (two) times daily.    risperiDONE (RISPERDAL) 0.25 MG tablet Take 0.125 mg by mouth every 6 (six) hours as needed.   risperiDONE (RISPERDAL) 0.5 MG tablet Take 0.5 mg by mouth 2 (two) times daily.   sertraline (ZOLOFT) 50 MG tablet Take 50 mg by mouth daily.    [DISCONTINUED] aspirin 81 MG chewable tablet Chew 1 tablet (81 mg total) by mouth daily.   [DISCONTINUED] esomeprazole (NEXIUM) 40 MG capsule TAKE ONE CAPSULE BY MOUTH DAILY BEFORE BREAKFAST (Patient not taking: Reported on 12/06/2019)   [DISCONTINUED] estrogens, conjugated, (PREMARIN) 0.3 MG tablet Take 1 tablet (0.3 mg total) by mouth daily. (Patient not taking: Reported on 12/06/2019)   [DISCONTINUED] mirabegron ER (MYRBETRIQ) 25 MG TB24 tablet Take 1 tablet (25 mg total) by mouth daily. (Patient not taking: Reported on 12/06/2019)   [DISCONTINUED] pregabalin (LYRICA) 150 MG capsule Take 1 capsule (150 mg total) by mouth 2 (two) times daily. (Patient not taking: Reported on 12/06/2019)   [DISCONTINUED] sulfamethoxazole-trimethoprim (BACTRIM DS) 800-160 MG tablet Take 1 tablet by mouth 2 (two) times daily.   [DISCONTINUED] traZODone (DESYREL) 50 MG tablet TAKE 1 TABLET  BY MOUTH AT BEDTIME AS NEEDED FOR SLEEP. (Patient not taking: Reported on 12/06/2019)   No  facility-administered encounter medications on file as of 02/10/2020.    GENERAL APPEARANCE: Elderly female sitting in her wheelchair, asleep and I was unable to awaken her to touch or voice. Arms crossed over her chest. Staff voice same concerns. Finally, able to awaken her with a sternal rub. She engaged eye contact, nonsensical syllables and lashed out with her hands then closed eyes again and slept  Julianne Handler, NP [place]

## 2020-04-13 ENCOUNTER — Other Ambulatory Visit: Payer: Self-pay

## 2020-04-13 ENCOUNTER — Non-Acute Institutional Stay: Payer: Medicare PPO | Admitting: Nurse Practitioner

## 2020-04-13 DIAGNOSIS — Z515 Encounter for palliative care: Secondary | ICD-10-CM

## 2020-04-13 DIAGNOSIS — F0391 Unspecified dementia with behavioral disturbance: Secondary | ICD-10-CM
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
## 2020-05-25 ENCOUNTER — Non-Acute Institutional Stay: Payer: Medicare PPO | Admitting: Nurse Practitioner

## 2020-05-25 DIAGNOSIS — F0281 Dementia in other diseases classified elsewhere with behavioral disturbance: Secondary | ICD-10-CM

## 2020-05-25 DIAGNOSIS — Z515 Encounter for palliative care: Secondary | ICD-10-CM

## 2020-05-25 NOTE — Progress Notes (Signed)
[facility] Consult Note Telephone: [phone]  Fax: [phone]  PATIENT NAME: Sandy Merritt [address] [place] [address] [phone] (home)  DOB: 10/16/1931 MRN: [id number]  PRIMARY CARE PROVIDER:    [name], FNP,  [address] [place] [address] [phone]  REFERRING PROVIDER:   [name], FNP [address] [place],  [place] [address] [phone]  RESPONSIBLE PARTY:   Extended Emergency Contact Information Primary Emergency Contact: [place] Phone: [phone] Work Phone: [phone] Mobile Phone: [phone] Relation: Daughter  I met face to face with patient in facility.   ASSESSMENT AND RECOMMENDATIONS:   1. Advance Care Planning: Goal of care: Patient's goal of care is comfort. Family want patient to be comfortable, and happy as she can be. Family want last days to be enjoyable. Directives: Patient has a living will. Family report patient does not want to be resuscitated in the event of cardiac or respiratory arrest. DNR form on chart in facility and on [facility] Epic EMR.  2. Symptom Management: Patient with advance dementia (FAST 7a).  Behavior concerns: Patient with behavioral concerns, she is followed by mental health provider. Her current regimen includes Melatonin 5mg  at bedtime, Zoloft 50mg  in the mornings, Remeron 7.5mg  at 8pm and Risperdal 0.25mg  twice a day and 0.125mg  every 6hrs as needed for agitation. Staff report patient agitation worsens in the evenings. Staff report patient gets sleepy in the mornings. Asked if patient morning medication is responsible for her sleepiness. Staff also report patient not sleeping during the night.  Recommendation: Patient currently takes Melatonin 5mg  at bed time. Recommend administering Melatonin in evening after dinner, to maximize affect. Consider increasing Melatonin  dose to 6mg . Discussed and reviewed dementia disease trajectory with staff, as it is progressive and terminal and likely to eventually lead to dysphagia, worsening weight loss and immobility. Palliative care will continue to provide support to patient, family and the medical team.   3. Follow up Palliative Care Visit: Palliative care will continue to follow for goals of care clarification and symptom management. Return in about 6-8 weeks or prn.  4. Family /Caregiver/Community Supports: Patient resides in a memory care unit. 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 Patient is widowed, has two daughters and son who are involved in her care as they are able.  5. Cognitive / Functional decline:  Patient is one person assist with stand pivot transfers, ambulates with wheelchair. Patient confused, dependent on staff for all ADLs, able to feed self. Incontinuent of bowel and bladder.   I spent 48 minutes providing this consultation, time includes time spent with patient, chart review, provider coordination, and documentation. More than 50% of the time in this consultation was spent coordinating communication.   HISTORY OF PRESENT ILLNESS:  Sandy Merritt is a 86 y.o.year old female with multiple medical problems including dementia with with adjustment disorder, insomnia, osteoporosis, lupus, GERD, esophageal stricture, diverticulosis, MVP, and degenerative arthritis C spine. Broken shoulder after fall 08/2019. -02/09/20 ER Left facial droop. Head CT no acute abnl. No further w/u (such as MRI head) as pt would not be eligible for any higher treatment than aspirin. Palliative Care was asked to help address advance care planning and goals of care. This is a follow up visit from 04/13/2020.  CODE STATUS: DNR  PPS: 30%  HOSPICE ELIGIBILITY/DIAGNOSIS: TBD  PHYSICAL EXAM /  ROS:   Current and past weights: 125.4lbs down from 132.2lbs two months ago, Ht 12f, BMI  24.5kg/m2 General: frail appearing, confused, cooperative, lying bed in her room awake Cardiovascular: denied chest pain, no edema, S1S2 normal Pulmonary: no cough, no SOB, room air GI: appetite fair, no report of constipation, incontinent of bowel GU: denies dysuria, incontinent of urine MSK:  no joint and ROM abnormalities Skin: no rashes or wounds reported Neurological: Weakness, confused  PAST MEDICAL HISTORY:  Past Medical History:  Diagnosis Date  . Arthritis    rheumatoid  . Chicken pox   . GERD (gastroesophageal reflux disease)   . Glaucoma   . Headaches, cluster   . Lupus ([place])   . Urine incontinence     SOCIAL HX:  Social History   Tobacco Use  . Smoking status: Never Smoker  . Smokeless tobacco: Never Used  Substance Use Topics  . Alcohol use: No   FAMILY HX:  Family History  Problem Relation Age of Onset  . Arthritis Mother   . Diabetes Mother   . Heart disease Mother   . Arthritis Father   . Heart disease Father   . Heart attack Father   . Diabetes Grandchild   . Drug abuse Maternal Aunt   . Colon cancer Neg Hx     ALLERGIES:  Allergies  Allergen Reactions  . Codeine Nausea And Vomiting  . Propoxyphene Nausea And Vomiting    Agent: Darvon  . Propoxyphene Hcl Nausea And Vomiting    Agent: Darvon  . Propoxyphene N-Acetaminophen Nausea And Vomiting    Agent: Darvocet     PERTINENT MEDICATIONS:  Outpatient Encounter Medications as of 05/25/2020  Medication Sig  . acetaminophen (TYLENOL) 325 MG tablet Take 650 mg by mouth every 8 (eight) hours as needed for mild pain or headache.  . alendronate (FOSAMAX) 70 MG tablet Take 70 mg by mouth once a week. Take with a full glass of water on an empty stomach.  Marland Kitchen b complex vitamins capsule Take 1 capsule by mouth daily.  Marland Kitchen buPROPion (WELLBUTRIN SR) 100 MG 12 hr tablet Take 100 mg by mouth daily.  . Cholecalciferol (VITAMIN D3) 25 MCG (1000 UT) CAPS Take 1,000 Units by mouth daily.  Marland Kitchen donepezil (ARICEPT)  10 MG tablet Take 10 mg by mouth daily.   . famotidine (PEPCID) 20 MG tablet Take 20 mg by mouth at bedtime.  . melatonin 5 MG TABS Take 5 mg by mouth at bedtime.  . mirtazapine (REMERON) 7.5 MG tablet Take 7.5 mg by mouth at bedtime.  . risperiDONE (RISPERDAL) 0.25 MG tablet Take 0.25 mg by mouth 2 (two) times daily.   . risperiDONE (RISPERDAL) 0.25 MG tablet Take 0.125 mg by mouth every 6 (six) hours as needed.  . risperiDONE (RISPERDAL) 0.5 MG tablet Take 0.5 mg by mouth 2 (two) times daily.  . sertraline (ZOLOFT) 50 MG tablet Take 50 mg by mouth daily.    No facility-administered encounter medications on file as of 05/25/2020.    Jari Favre, DNP, AGPCNP-BC

## 2020-05-26 ENCOUNTER — Other Ambulatory Visit: Payer: Self-pay

## 2020-08-12 ENCOUNTER — Encounter (HOSPITAL_COMMUNITY): Payer: Self-pay | Admitting: Emergency Medicine

## 2020-08-12 ENCOUNTER — Other Ambulatory Visit: Payer: Self-pay

## 2020-08-12 ENCOUNTER — Emergency Department (HOSPITAL_COMMUNITY)
Admission: EM | Admit: 2020-08-12 | Discharge: 2020-08-13 | Disposition: A | Discharge status: Home / Self Care | Payer: Medicare Other | Attending: Emergency Medicine | Admitting: Emergency Medicine

## 2020-08-12 DIAGNOSIS — S0990XA Unspecified injury of head, initial encounter: Secondary | ICD-10-CM | POA: Diagnosis present

## 2020-08-12 DIAGNOSIS — I10 Essential (primary) hypertension: Secondary | ICD-10-CM | POA: Diagnosis not present

## 2020-08-12 DIAGNOSIS — J45909 Unspecified asthma, uncomplicated: Secondary | ICD-10-CM | POA: Insufficient documentation

## 2020-08-12 DIAGNOSIS — W19XXXA Unspecified fall, initial encounter: Secondary | ICD-10-CM | POA: Insufficient documentation

## 2020-08-12 DIAGNOSIS — S0181XA Laceration without foreign body of other part of head, initial encounter: Secondary | ICD-10-CM | POA: Insufficient documentation

## 2020-08-12 NOTE — ED Provider Notes (Signed)
[facility] DEPT Provider Note   CSN: [id number] Arrival date & time: 08/12/20  2328     History No chief complaint on file.   Sandy Merritt is a 87 y.o. female.  The history is provided by the EMS personnel. The history is limited by the condition of the patient.  Fall This is a new problem. The current episode started less than 1 hour ago. The problem occurs constantly. The problem has not changed since onset.Pertinent negatives include no chest pain and no abdominal pain. Nothing aggravates the symptoms. Nothing relieves the symptoms. She has tried nothing for the symptoms. The treatment provided no relief.       Past Medical History:  Diagnosis Date  . Arthritis    rheumatoid  . Chicken pox   . GERD (gastroesophageal reflux disease)   . Glaucoma   . Headaches, cluster   . Lupus ([place])   . Urine incontinence     Patient Active Problem List   Diagnosis Date Noted  . Vaginal itching 05/03/2016  . Vision changes 03/16/2016  . Vaginal irritation 03/01/2016  . Fatigue 09/29/2015  . Degenerative arthritis of cervical spine 08/04/2015  . Urinary frequency 05/24/2015  . Toenail deformity 03/03/2015  . Basal cell carcinoma of skin of breast 03/03/2015  . Insomnia 07/31/2014  . Unspecified hereditary and idiopathic peripheral neuropathy 04/04/2014  . Benign paroxysmal positional vertigo 03/20/2014  . Vertigo 03/01/2014  . Lower abdominal pain 03/01/2014  . Anemia, unspecified 01/23/2014  . Lupus ([place]) 05/16/2011  . ASTHMA 08/11/2010  . HYPERLIPIDEMIA 08/10/2010  . ESOPHAGEAL STRICTURE 06/25/2008  . GERD 06/25/2008  . ANXIETY, CHRONIC, HX OF 06/25/2008  . MITRAL VALVE PROLAPSE, HX OF 06/25/2008  . Essential hypertension 01/30/2008  . DIVERTICULOSIS, COLON 01/30/2008  . OSTEOPENIA 01/30/2008    Past Surgical History:  Procedure Laterality Date  . ABDOMINAL HYSTERECTOMY    . APPENDECTOMY  1970  . CERVICAL SPINE SURGERY     ant.  neck/done x2/ screws out now  . CESAREAN SECTION     2  . CHOLECYSTECTOMY  1970  . FOOT SURGERY  years ago   "toes"/"heart stopped during surgery" per pt.only(not medical staff)  . TONSILLECTOMY AND ADENOIDECTOMY  1955  . VAGINAL DELIVERY     1     OB History   No obstetric history on file.     Family History  Problem Relation Age of Onset  . Arthritis Mother   . Diabetes Mother   . Heart disease Mother   . Arthritis Father   . Heart disease Father   . Heart attack Father   . Diabetes Grandchild   . Drug abuse Maternal Aunt   . Colon cancer Neg Hx     Social History   Tobacco Use  . Smoking status: Never Smoker  . Smokeless tobacco: Never Used  Substance Use Topics  . Alcohol use: No  . Drug use: No    Home Medications Prior to Admission medications   Medication Sig Start Date End Date Taking? Authorizing Provider  acetaminophen (TYLENOL) 325 MG tablet Take 650 mg by mouth every 8 (eight) hours as needed for mild pain or headache.    [provider]  alendronate (FOSAMAX) 70 MG tablet Take 70 mg by mouth once a week. Take with a full glass of water on an empty stomach.    [provider]  b complex vitamins capsule Take 1 capsule by mouth daily.    [provider]  buPROPion (WELLBUTRIN SR) 100 MG 12 hr tablet Take 100 mg by mouth daily.    [provider]  Cholecalciferol (VITAMIN D3) 25 MCG (1000 UT) CAPS Take 1,000 Units by mouth daily.    [provider]  donepezil (ARICEPT) 10 MG tablet Take 10 mg by mouth daily.     [provider]  famotidine (PEPCID) 20 MG tablet Take 20 mg by mouth at bedtime.    [provider]  melatonin 5 MG TABS Take 5 mg by mouth at bedtime.    [provider]  mirtazapine (REMERON) 7.5 MG tablet Take 7.5 mg by mouth at bedtime.    [provider]  risperiDONE (RISPERDAL) 0.25 MG tablet Take 0.25 mg by mouth 2 (two) times daily.     [provider]  risperiDONE (RISPERDAL) 0.25 MG tablet Take 0.125 mg by mouth every 6 (six) hours as needed.    [provider]  risperiDONE (RISPERDAL) 0.5 MG tablet Take 0.5 mg by mouth 2 (two) times daily.    [provider]  sertraline (ZOLOFT) 50 MG tablet Take 50 mg by mouth daily.     [provider]    Allergies    Codeine, Propoxyphene, Propoxyphene hcl, and Propoxyphene n-acetaminophen  Review of Systems   Review of Systems  Unable to perform ROS: Dementia  Cardiovascular: Negative for chest pain.  Gastrointestinal: Negative for abdominal pain.    Physical Exam Updated Vital Signs There were no vitals taken for this visit.  Physical Exam Vitals and nursing note reviewed.  Constitutional:      Appearance: Normal appearance.  HENT:     Head: Normocephalic.     Jaw: No trismus.      Right Ear: Tympanic membrane normal.     Left Ear: Tympanic membrane normal.     Nose: Nose normal.  Eyes:     Conjunctiva/sclera: Conjunctivae normal.     Pupils: Pupils are equal, round, and reactive to light.  Neck:     Comments: c collar in place trachea is midline  Cardiovascular:     Rate and Rhythm: Normal rate and regular rhythm.     Pulses: Normal pulses.     Heart sounds: Normal heart sounds.  Pulmonary:     Effort: Pulmonary effort is normal.     Breath sounds: Normal breath sounds.  Abdominal:     General: Abdomen is flat. Bowel sounds are normal.     Palpations: Abdomen is soft.     Tenderness: There is no abdominal tenderness. There is no guarding.  Musculoskeletal:        General: Normal range of motion.  Skin:    General: Skin is warm and dry.     Capillary Refill: Capillary refill takes less than 2 seconds.  Neurological:     Mental Status: She is alert.     Deep Tendon Reflexes: Reflexes normal.  Psychiatric:        Mood and Affect: Mood normal.     ED Results / Procedures / Treatments   Labs (all labs ordered are listed, but only  abnormal results are displayed) Labs Reviewed - No data to display  EKG None  Radiology Results for orders placed or performed during the hospital encounter of 02/09/20  Protime-INR  Result Value Ref Range   Prothrombin Time 13.2 11.4 - 15.2 seconds   INR 1.0 0.8 - 1.2  APTT  Result Value Ref Range   aPTT 30 24 - 36 seconds  CBC  Result Value Ref Range   WBC 6.6 4.0 - 10.5 K/uL   RBC 3.85 (L) 3.87 - 5.11 MIL/uL   Hemoglobin 11.0 (L) 12.0 - 15.0 g/dL   HCT 35.4 (L) 36.0 - 46.0 %   MCV 91.9 80.0 - 100.0 fL   MCH 28.6 26.0 - 34.0 pg   MCHC 31.1 30.0 - 36.0 g/dL   RDW 14.0 11.5 - 15.5 %   Platelets 329 150 - 400 K/uL   nRBC 0.0 0.0 - 0.2 %  Differential  Result Value Ref Range   Neutrophils Relative % 62 %   Neutro Abs 4.2 1.7 - 7.7 K/uL   Lymphocytes Relative 22 %   Lymphs Abs 1.4 0.7 - 4.0 K/uL   Monocytes Relative 10 %   Monocytes Absolute 0.6 0.1 - 1.0 K/uL   Eosinophils Relative 4 %   Eosinophils Absolute 0.2 0.0 - 0.5 K/uL   Basophils Relative 1 %   Basophils Absolute 0.1 0.0 - 0.1 K/uL   Immature Granulocytes 1 %   Abs Immature Granulocytes 0.03 0.00 - 0.07 K/uL  Comprehensive metabolic panel  Result Value Ref Range   Sodium 135 135 - 145 mmol/L   Potassium 4.3 3.5 - 5.1 mmol/L   Chloride 100 98 - 111 mmol/L   CO2 25 22 - 32 mmol/L   Glucose, Bld 93 70 - 99 mg/dL   BUN 13 8 - 23 mg/dL   Creatinine, Ser 0.97 0.44 - 1.00 mg/dL   Calcium 9.0 8.9 - 10.3 mg/dL   Total Protein 7.4 6.5 - 8.1 g/dL   Albumin 3.5 3.5 - 5.0 g/dL   AST 37 15 - 41 U/L   ALT 29 0 - 44 U/L   Alkaline Phosphatase 68 38 - 126 U/L   Total Bilirubin 0.3 0.3 - 1.2 mg/dL   GFR calc non Af Amer 53 (L) >60 mL/min   GFR calc Af Amer >60 >60 mL/min   Anion gap 10 5 - 15  Urinalysis, Routine w reflex microscopic  Result Value Ref Range   Color, Urine YELLOW YELLOW   APPearance CLEAR CLEAR   Specific Gravity, Urine 1.011 1.005 - 1.030   pH 7.0 5.0 - 8.0   Glucose, UA NEGATIVE NEGATIVE mg/dL    Hgb urine dipstick NEGATIVE NEGATIVE   Bilirubin Urine NEGATIVE NEGATIVE   Ketones, ur NEGATIVE NEGATIVE mg/dL   Protein, ur NEGATIVE NEGATIVE mg/dL   Nitrite NEGATIVE NEGATIVE   Leukocytes,Ua NEGATIVE NEGATIVE  Rapid urine drug screen (hospital performed)  Result Value Ref Range   Opiates NONE DETECTED NONE DETECTED   Cocaine NONE DETECTED NONE DETECTED   Benzodiazepines NONE DETECTED NONE DETECTED   Amphetamines NONE DETECTED NONE DETECTED   Tetrahydrocannabinol NONE DETECTED NONE DETECTED   Barbiturates NONE DETECTED NONE DETECTED   CT Head Wo Contrast  Result Date: 08/13/2020 CLINICAL DATA:  Unwitnessed fall, head injury, scalp laceration, dementia EXAM: CT HEAD WITHOUT CONTRAST CT CERVICAL SPINE WITHOUT CONTRAST TECHNIQUE: Multidetector CT imaging of the head and cervical spine was performed following the standard protocol without intravenous contrast. Multiplanar CT image reconstructions of the cervical spine were also generated. COMPARISON:  01/28/2020, CT head 03/01/2014 FINDINGS: CT HEAD FINDINGS Brain: Normal anatomic configuration of the brain. There is moderate diffuse cerebral parenchymal volume loss, similar to prior examination, but significantly progressed when compared to remote prior examination of 03/01/2014. Mild ventriculomegaly appears stable since immediate prior examination. While this may simply relate to central atrophy, normal pressure hydrocephalus could appear  similarly. Tiny remote lacunar infarcts are noted within the cerebellar hemispheres bilaterally, unchanged. No evidence of acute intracranial hemorrhage or infarct. No abnormal mass effect or midline shift. No abnormal intra or extra-axial mass lesion or fluid collection. Vascular: No asymmetric hyperdense vasculature at the skull base. Skull: Intact Sinuses/Orbits: The paranasal sinuses are clear. The orbits are unremarkable. Other: Mastoid air cells and middle ear cavities are clear. Mild soft tissue swelling  superficial to the right lateral orbital wall and right zygomaticomaxillary suture. CT CERVICAL SPINE FINDINGS Alignment: C4 and C5 corpectomy has been performed. C3-C7 anterior cervical fusion with instrumentation has been performed. Mild straightening along the fused segment. Solid incorporation of interbody bone graft. C7 vertebral body screws appear chronically fractured, unchanged. Stable 2 mm anterolisthesis C7 upon T1. Skull base and vertebrae: Advanced degenerative changes involving the left atlantoaxial articulation and atlantodental articulation are again seen with remodeling of the odontoid process, unchanged. Craniocervical alignment is unchanged. Mild central canal stenosis at the a craniocervical junction is unchanged. No acute fracture of the cervical spine. Soft tissues and spinal canal: No prevertebral fluid or swelling. No visible canal hematoma. Disc levels: Remaining intervertebral disc heights have been preserved. Mild endplate remodeling at Y3-K1 is in keeping with mild degenerative disc disease. Review of the axial images demonstrates exuberant osteophyte formation involving the left atlantoaxial articulation which abuts but does not remodel of the thecal sac. Multilevel uncovertebral and facet arthrosis results in moderate to severe left neural foraminal narrowing at C3-4, moderate to severe bilateral neural foraminal narrowing at C4-5, right greater than left, moderate to severe right and moderate left neural foraminal narrowing at C5-6, and mild neural foraminal narrowing bilaterally at C6-7 and on the left at C7-T1. Upper chest: Unremarkable Other: None signified IMPRESSION: No acute intracranial injury.  No calvarial fracture. Advanced parenchymal atrophy, similar to prior examination though significantly progressed since remote prior examination of 03/01/2014. Mild ventriculomegaly, slightly asymmetric with the degree of parenchymal volume loss. Clinical correlation for signs and  symptoms of normal pressure hydrocephalus may be helpful. The degree of ventriculomegaly is stable since immediate prior examinations. No acute fracture or listhesis of the cervical spine. Postsurgical changes as described above. Multilevel degenerative changes resulting in mild central canal stenosis at the craniocervical junction as well as multilevel neural foraminal narrowing as described above. Electronically Signed   By: Fidela Salisbury MD   On: 08/13/2020 01:56   CT Cervical Spine Wo Contrast  Result Date: 08/13/2020 CLINICAL DATA:  Unwitnessed fall, head injury, scalp laceration, dementia EXAM: CT HEAD WITHOUT CONTRAST CT CERVICAL SPINE WITHOUT CONTRAST TECHNIQUE: Multidetector CT imaging of the head and cervical spine was performed following the standard protocol without intravenous contrast. Multiplanar CT image reconstructions of the cervical spine were also generated. COMPARISON:  01/28/2020, CT head 03/01/2014 FINDINGS: CT HEAD FINDINGS Brain: Normal anatomic configuration of the brain. There is moderate diffuse cerebral parenchymal volume loss, similar to prior examination, but significantly progressed when compared to remote prior examination of 03/01/2014. Mild ventriculomegaly appears stable since immediate prior examination. While this may simply relate to central atrophy, normal pressure hydrocephalus could appear similarly. Tiny remote lacunar infarcts are noted within the cerebellar hemispheres bilaterally, unchanged. No evidence of acute intracranial hemorrhage or infarct. No abnormal mass effect or midline shift. No abnormal intra or extra-axial mass lesion or fluid collection. Vascular: No asymmetric hyperdense vasculature at the skull base. Skull: Intact Sinuses/Orbits: The paranasal sinuses are clear. The orbits are unremarkable. Other: Mastoid air cells and middle ear  cavities are clear. Mild soft tissue swelling superficial to the right lateral orbital wall and right  zygomaticomaxillary suture. CT CERVICAL SPINE FINDINGS Alignment: C4 and C5 corpectomy has been performed. C3-C7 anterior cervical fusion with instrumentation has been performed. Mild straightening along the fused segment. Solid incorporation of interbody bone graft. C7 vertebral body screws appear chronically fractured, unchanged. Stable 2 mm anterolisthesis C7 upon T1. Skull base and vertebrae: Advanced degenerative changes involving the left atlantoaxial articulation and atlantodental articulation are again seen with remodeling of the odontoid process, unchanged. Craniocervical alignment is unchanged. Mild central canal stenosis at the a craniocervical junction is unchanged. No acute fracture of the cervical spine. Soft tissues and spinal canal: No prevertebral fluid or swelling. No visible canal hematoma. Disc levels: Remaining intervertebral disc heights have been preserved. Mild endplate remodeling at O7-F6 is in keeping with mild degenerative disc disease. Review of the axial images demonstrates exuberant osteophyte formation involving the left atlantoaxial articulation which abuts but does not remodel of the thecal sac. Multilevel uncovertebral and facet arthrosis results in moderate to severe left neural foraminal narrowing at C3-4, moderate to severe bilateral neural foraminal narrowing at C4-5, right greater than left, moderate to severe right and moderate left neural foraminal narrowing at C5-6, and mild neural foraminal narrowing bilaterally at C6-7 and on the left at C7-T1. Upper chest: Unremarkable Other: None signified IMPRESSION: No acute intracranial injury.  No calvarial fracture. Advanced parenchymal atrophy, similar to prior examination though significantly progressed since remote prior examination of 03/01/2014. Mild ventriculomegaly, slightly asymmetric with the degree of parenchymal volume loss. Clinical correlation for signs and symptoms of normal pressure hydrocephalus may be helpful. The  degree of ventriculomegaly is stable since immediate prior examinations. No acute fracture or listhesis of the cervical spine. Postsurgical changes as described above. Multilevel degenerative changes resulting in mild central canal stenosis at the craniocervical junction as well as multilevel neural foraminal narrowing as described above. Electronically Signed   By: Fidela Salisbury MD   On: 08/13/2020 01:56    Procedures .Marland KitchenLaceration Repair  Date/Time: 08/13/2020 12:17 AM Performed by: Veatrice Kells, MD Authorized by: Veatrice Kells, MD   Consent:    Consent obtained:  Verbal   Risks discussed:  Infection, need for additional repair, nerve damage, pain, poor cosmetic result, poor wound healing, retained foreign body, tendon damage and vascular damage   Alternatives discussed:  No treatment Universal protocol:    Patient identity confirmed:  Arm band Anesthesia:    Anesthesia method:  None Laceration details:    Location:  Face   Face location:  Forehead   Length (cm):  1   Depth (mm):  1 Pre-procedure details:    Preparation:  Patient was prepped and draped in usual sterile fashion Exploration:    Hemostasis achieved with:  Direct pressure   Wound extent: no areolar tissue violation noted     Contaminated: no   Treatment:    Area cleansed with:  Povidone-iodine and chlorhexidine   Amount of cleaning:  Extensive   Debridement:  None   Scar revision: no   Skin repair:    Repair method:  Tissue adhesive Repair type:    Repair type:  Simple Post-procedure details:    Dressing:  Sterile dressing   Procedure completion:  Tolerated well, no immediate complications     Medications Ordered in ED Medications - No data to display  ED Course  I have reviewed the triage vital signs and the nursing notes.  Pertinent labs &  imaging results that were available during my care of the patient were reviewed by me and considered in my medical decision making (see chart for details).    No acute fractures or intracranial findings. Stable for discharge with close follow up.    Sandy Merritt was evaluated in Emergency Department on 08/12/2020 for the symptoms described in the history of present illness. She was evaluated in the context of the global COVID-19 pandemic, which necessitated consideration that the patient might be at risk for infection with the SARS-CoV-2 virus that causes COVID-19. Institutional protocols and algorithms that pertain to the evaluation of patients at risk for COVID-19 are in a state of rapid change based on information released by regulatory bodies including the CDC and federal and state organizations. These policies and algorithms were followed during the patient's care in the ED.  Final Clinical Impression(s) / ED Diagnoses Return for intractable cough, coughing up blood, fevers >100.4 unrelieved by medication, shortness of breath, intractable vomiting, chest pain, shortness of breath, weakness, numbness, changes in speech, facial asymmetry, abdominal pain, passing out, Inability to tolerate liquids or food, cough, altered mental status or any concerns. No signs of systemic illness or infection. The patient is nontoxic-appearing on exam and vital signs are within normal limits.  I have reviewed the triage vital signs and the nursing notes. Pertinent labs & imaging results that were available during my care of the patient were reviewed by me and considered in my medical decision making (see chart for details). After history, exam, and medical workup I feel the patient has been appropriately medically screened and is safe for discharge home. Pertinent diagnoses were discussed with the patient. Patient was given return precautions.    Etsuko Dierolf, MD 08/13/20 [id number]

## 2020-08-12 NOTE — ED Triage Notes (Signed)
Pt to the ER with the c/o  a unwitnessed fall. Laceration noted on the right side of forehead. Hx of dementia and falls.

## 2020-08-13 ENCOUNTER — Emergency Department (HOSPITAL_COMMUNITY): Payer: Medicare Other

## 2020-08-13 ENCOUNTER — Encounter (HOSPITAL_COMMUNITY): Payer: Self-pay | Admitting: Emergency Medicine

## 2020-09-07 IMAGING — CT CT HEAD W/O CM
4 series · 16 of 47 positions shown, 18 images · non-contrast
Comparison: 01/28/2020

CLINICAL DATA: Neurologic deficit. Slurred speech and left-sided
weakness. Dementia.

EXAM:
CT HEAD WITHOUT CONTRAST
TECHNIQUE: Contiguous axial images were obtained from the base of the skull
through the vertex without intravenous contrast.

[Series 3: head wo · axial · 0.38mm/px · z∈[-136,-16]mm · 7 of 32 slices shown, 9 images]
[im 4/32  brain]
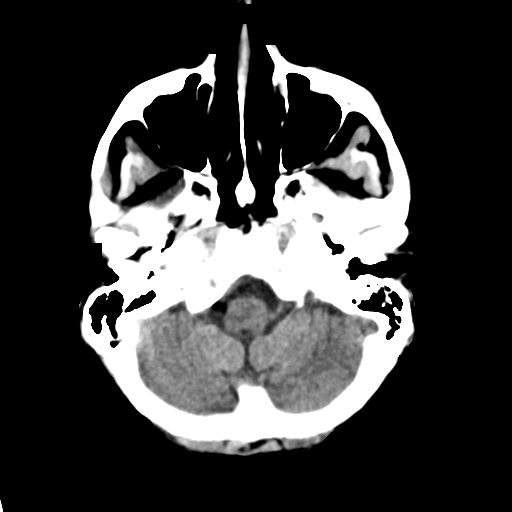
[im 4/32  bone]
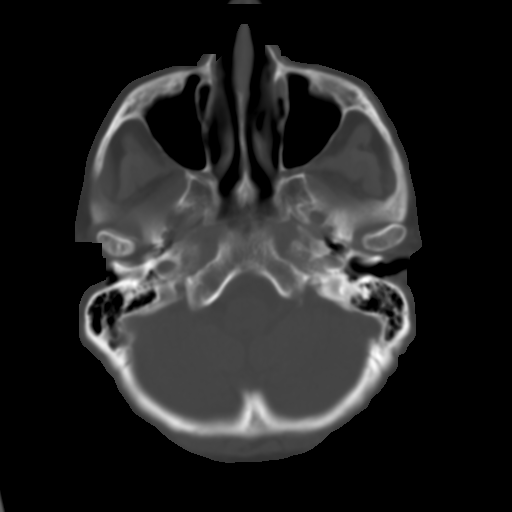
[im 8/32  brain]
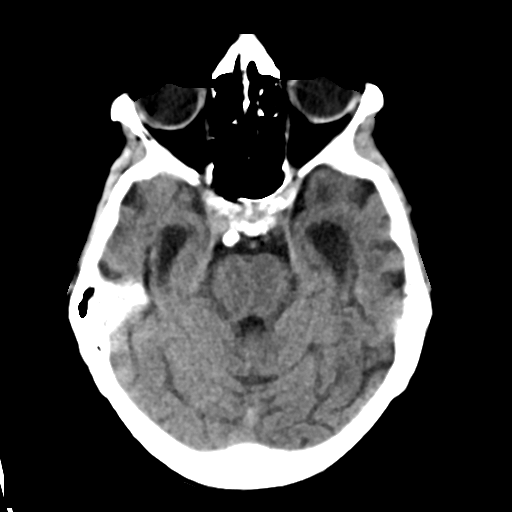
[im 12/32  brain]
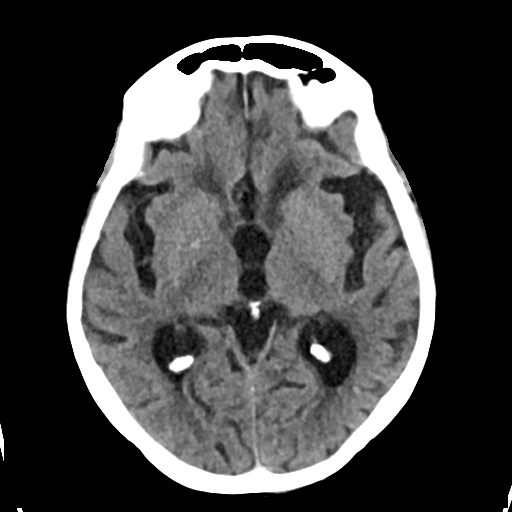
[im 16/32  brain]
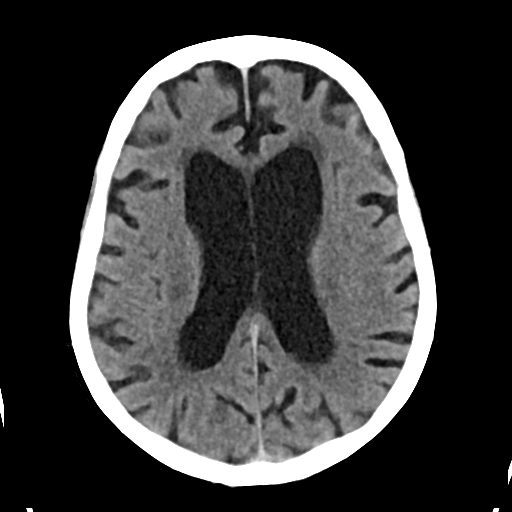
[im 20/32  brain]
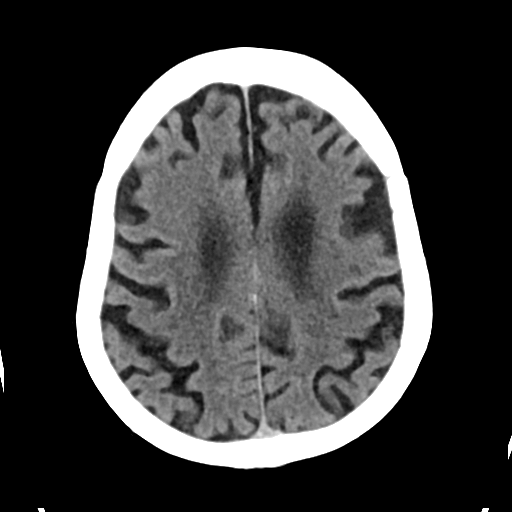
[im 20/32  bone]
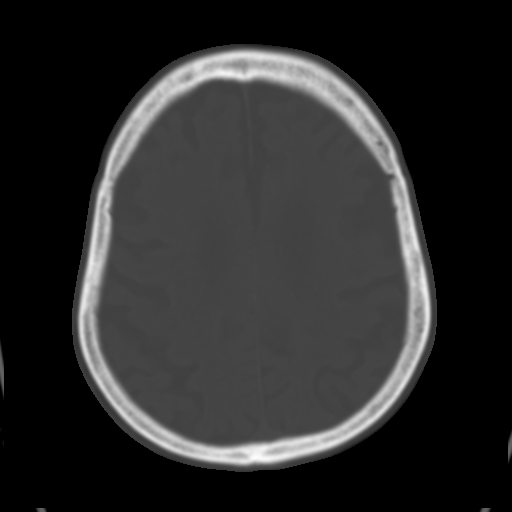
[im 24/32  brain]
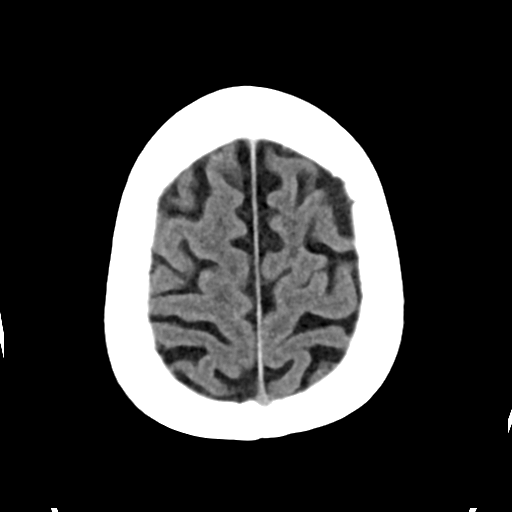
[im 28/32  brain]
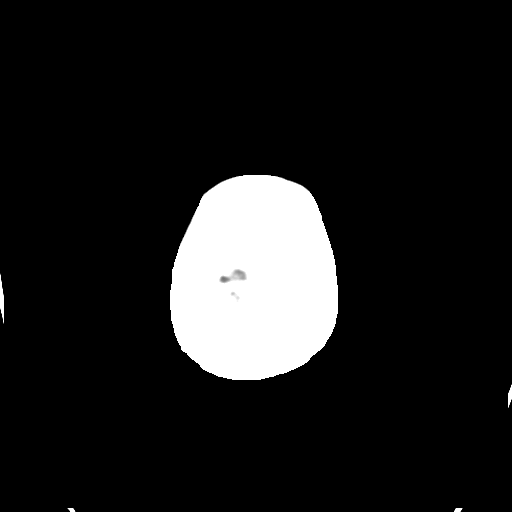

[Series 4: head bone · axial · 0.38mm/px · z∈[-137,-105]mm · 3 of 78 slices shown]
[im 8/78  bone]
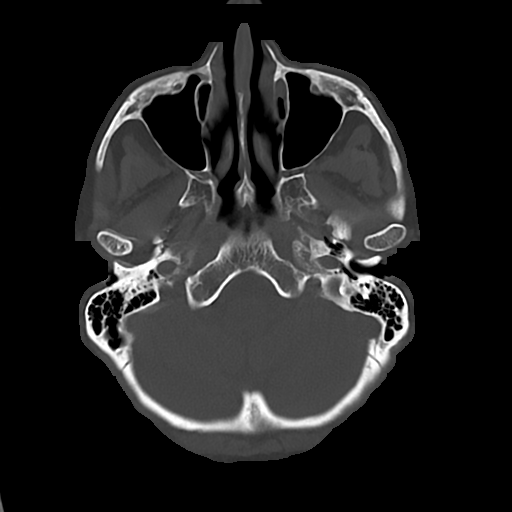
[im 16/78  bone]
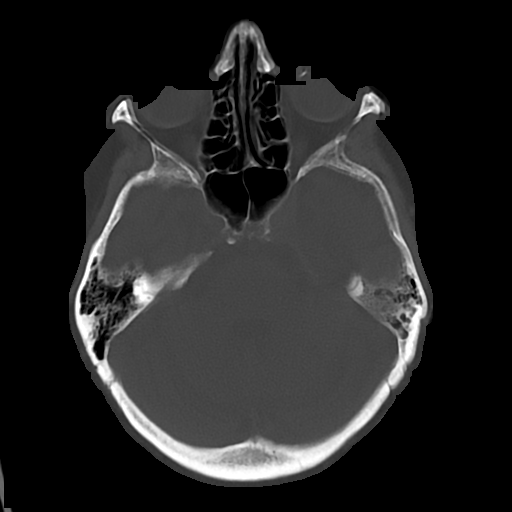
[im 24/78  bone]
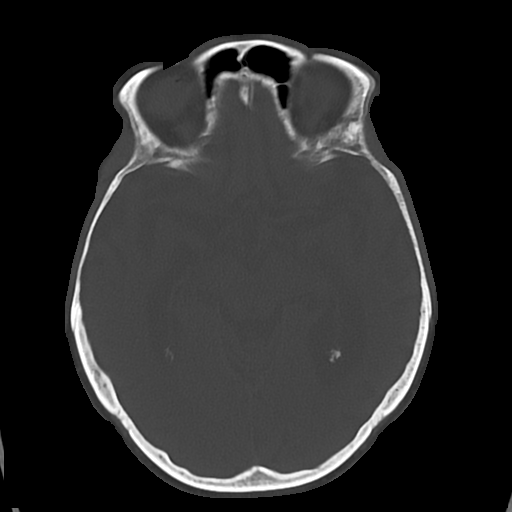

[Series 5: cor soft · coronal · 0.28mm/px · 3 of 65 slices shown]
[im 22/65  brain]
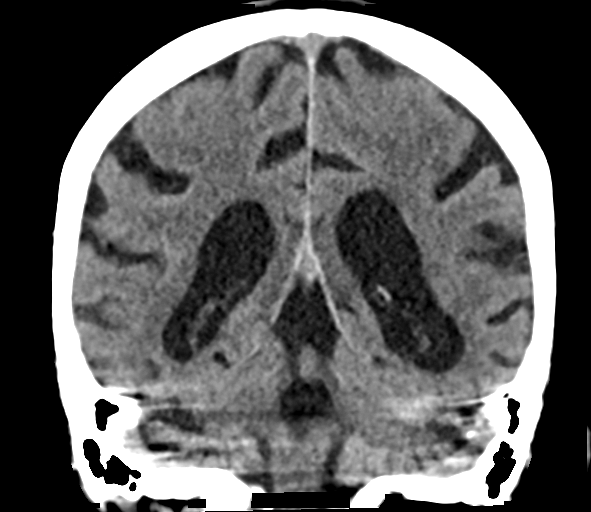
[im 29/65  brain]
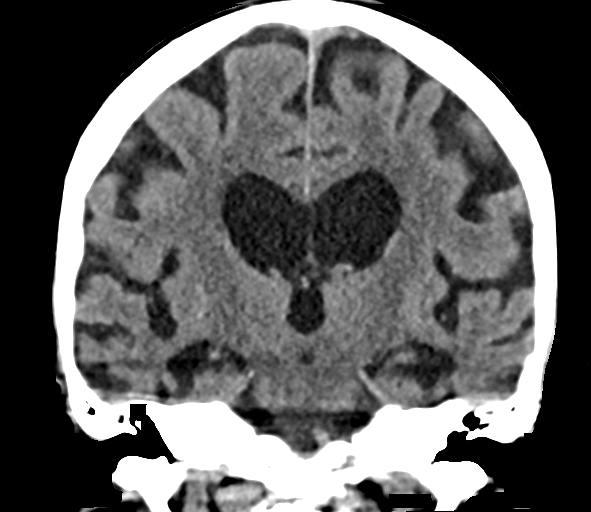
[im 36/65  brain]
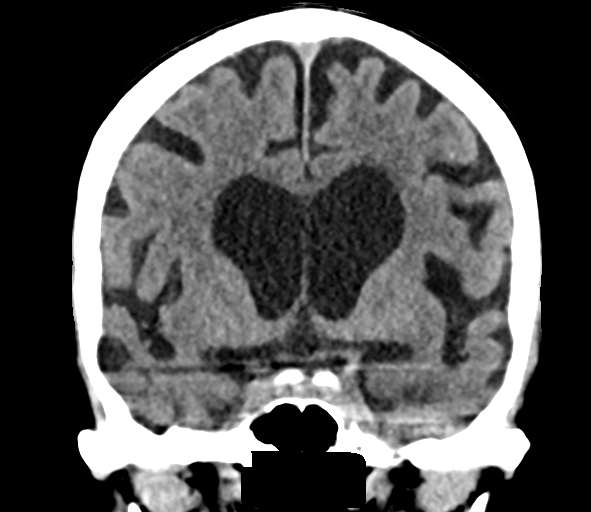

[Series 6: sag soft · sagittal · 0.28mm/px · 3 of 55 slices shown]
[im 19/55  brain]
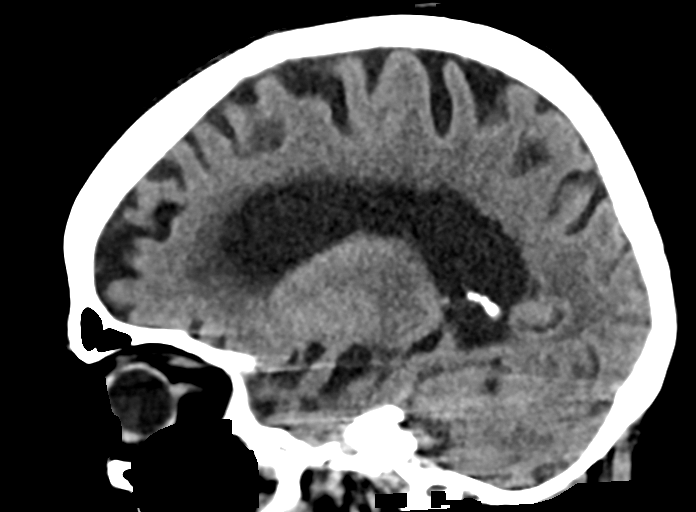
[im 28/55  brain]
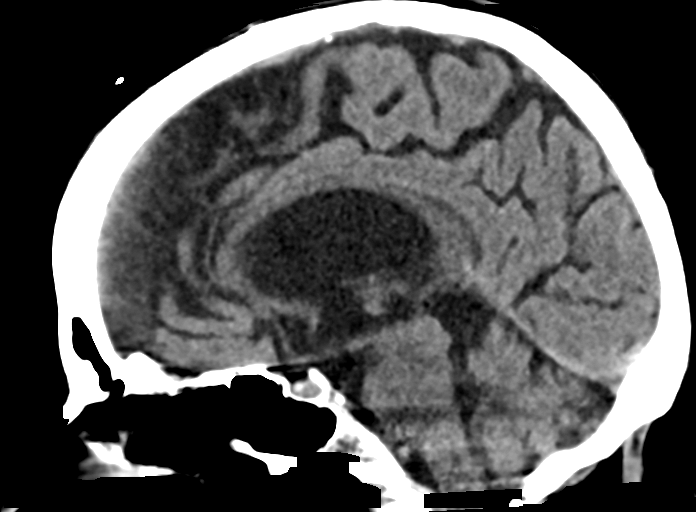
[im 37/55  brain]
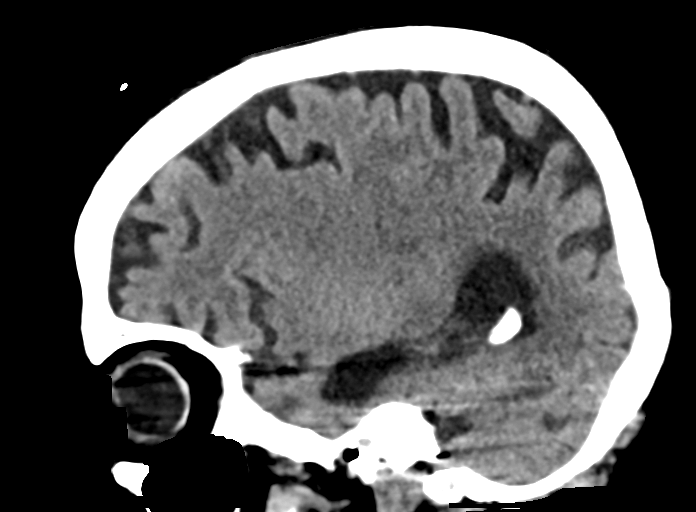

[16 of 47 positions shown; findings below may reference images not displayed]

FINDINGS: Brain: Examination demonstrates age related atrophy which is stable.
No focal mass, mass effect, shift of midline structures or acute
hemorrhage. No evidence of acute infarction. Evidence of mild
chronic ischemic microvascular disease.

Vascular: No hyperdense vessel or unexpected calcification.

Skull: Normal. Negative for fracture or focal lesion.

Sinuses/Orbits: No acute finding.

Other: None.
IMPRESSION: 1.  No acute findings.

2.  Chronic ischemic microvascular disease and age related atrophy.

## 2020-09-15 DEATH — deceased
